# Patient Record
Sex: Female | Born: 1955
Health system: Southern US, Community
[De-identification: ages and names within clinical notes are randomized; demographics above are authoritative.]

## PROBLEM LIST (undated history)

## (undated) ENCOUNTER — Emergency Department (HOSPITAL_COMMUNITY): Admission: EM | Source: Home / Self Care

## (undated) DIAGNOSIS — E559 Vitamin D deficiency, unspecified: Secondary | ICD-10-CM

## (undated) DIAGNOSIS — Z9889 Other specified postprocedural states: Secondary | ICD-10-CM

## (undated) DIAGNOSIS — K8050 Calculus of bile duct without cholangitis or cholecystitis without obstruction: Secondary | ICD-10-CM

## (undated) DIAGNOSIS — K219 Gastro-esophageal reflux disease without esophagitis: Secondary | ICD-10-CM

## (undated) DIAGNOSIS — I10 Essential (primary) hypertension: Secondary | ICD-10-CM

## (undated) DIAGNOSIS — E669 Obesity, unspecified: Secondary | ICD-10-CM

## (undated) DIAGNOSIS — N8110 Cystocele, unspecified: Secondary | ICD-10-CM

## (undated) DIAGNOSIS — Z5189 Encounter for other specified aftercare: Secondary | ICD-10-CM

## (undated) DIAGNOSIS — M4120 Other idiopathic scoliosis, site unspecified: Secondary | ICD-10-CM

## (undated) DIAGNOSIS — E785 Hyperlipidemia, unspecified: Secondary | ICD-10-CM

## (undated) DIAGNOSIS — Z8679 Personal history of other diseases of the circulatory system: Secondary | ICD-10-CM

## (undated) DIAGNOSIS — Z973 Presence of spectacles and contact lenses: Secondary | ICD-10-CM

## (undated) DIAGNOSIS — Z780 Asymptomatic menopausal state: Secondary | ICD-10-CM

## (undated) HISTORY — DX: Other idiopathic scoliosis, site unspecified: M41.20

## (undated) HISTORY — DX: Gastro-esophageal reflux disease without esophagitis: K21.9

## (undated) HISTORY — DX: Obesity, unspecified: E66.9

## (undated) HISTORY — DX: Asymptomatic menopausal state: Z78.0

## (undated) HISTORY — DX: Essential (primary) hypertension: I10

## (undated) HISTORY — DX: Encounter for other specified aftercare: Z51.89

## (undated) HISTORY — DX: Other specified postprocedural states: Z98.89

## (undated) HISTORY — DX: Hyperlipidemia, unspecified: E78.5

## (undated) HISTORY — DX: Calculus of bile duct without cholangitis or cholecystitis without obstruction: K80.50

---
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
## 1981-06-28 HISTORY — PX: ECTOPIC PREGNANCY SURGERY: SHX613

## 1990-06-28 HISTORY — PX: VAGINAL HYSTERECTOMY: SUR661

## 2001-03-22 ENCOUNTER — Encounter: Payer: Self-pay | Admitting: Family Medicine

## 2001-03-22 ENCOUNTER — Ambulatory Visit (HOSPITAL_COMMUNITY): Admission: RE | Admit: 2001-03-22 | Discharge: 2001-03-22 | Payer: Self-pay | Admitting: Family Medicine

## 2001-09-26 ENCOUNTER — Other Ambulatory Visit: Admission: RE | Admit: 2001-09-26 | Discharge: 2001-09-26 | Payer: Self-pay | Admitting: Obstetrics and Gynecology

## 2002-04-17 ENCOUNTER — Encounter: Payer: Self-pay | Admitting: Obstetrics and Gynecology

## 2002-04-17 ENCOUNTER — Ambulatory Visit (HOSPITAL_COMMUNITY): Admission: RE | Admit: 2002-04-17 | Discharge: 2002-04-17 | Payer: Self-pay | Admitting: Obstetrics and Gynecology

## 2003-04-19 ENCOUNTER — Ambulatory Visit (HOSPITAL_COMMUNITY): Admission: RE | Admit: 2003-04-19 | Discharge: 2003-04-19 | Payer: Self-pay | Admitting: Obstetrics and Gynecology

## 2003-04-19 ENCOUNTER — Encounter: Payer: Self-pay | Admitting: Obstetrics and Gynecology

## 2003-04-23 ENCOUNTER — Encounter: Payer: Self-pay | Admitting: Internal Medicine

## 2005-02-02 ENCOUNTER — Other Ambulatory Visit: Admission: RE | Admit: 2005-02-02 | Discharge: 2005-02-02 | Payer: Self-pay | Admitting: Obstetrics and Gynecology

## 2006-06-28 LAB — CONVERTED CEMR LAB

## 2009-03-21 ENCOUNTER — Ambulatory Visit (HOSPITAL_COMMUNITY): Admission: RE | Admit: 2009-03-21 | Discharge: 2009-03-21 | Payer: Self-pay | Admitting: Orthopedic Surgery

## 2009-07-29 LAB — HM MAMMOGRAPHY

## 2010-05-18 ENCOUNTER — Encounter (INDEPENDENT_AMBULATORY_CARE_PROVIDER_SITE_OTHER): Payer: Self-pay | Admitting: *Deleted

## 2010-06-24 ENCOUNTER — Ambulatory Visit: Payer: Self-pay | Admitting: Internal Medicine

## 2010-06-24 ENCOUNTER — Ambulatory Visit
Admission: RE | Admit: 2010-06-24 | Discharge: 2010-06-24 | Payer: Self-pay | Source: Home / Self Care | Attending: Internal Medicine | Admitting: Internal Medicine

## 2010-06-24 DIAGNOSIS — M4120 Other idiopathic scoliosis, site unspecified: Secondary | ICD-10-CM | POA: Insufficient documentation

## 2010-06-24 DIAGNOSIS — I10 Essential (primary) hypertension: Secondary | ICD-10-CM

## 2010-06-24 DIAGNOSIS — K219 Gastro-esophageal reflux disease without esophagitis: Secondary | ICD-10-CM

## 2010-06-24 DIAGNOSIS — E669 Obesity, unspecified: Secondary | ICD-10-CM | POA: Insufficient documentation

## 2010-06-24 DIAGNOSIS — R519 Headache, unspecified: Secondary | ICD-10-CM | POA: Insufficient documentation

## 2010-06-24 DIAGNOSIS — R51 Headache: Secondary | ICD-10-CM | POA: Insufficient documentation

## 2010-06-24 DIAGNOSIS — Z780 Asymptomatic menopausal state: Secondary | ICD-10-CM | POA: Insufficient documentation

## 2010-06-28 HISTORY — PX: COLONOSCOPY: SHX174

## 2010-07-07 ENCOUNTER — Ambulatory Visit
Admission: RE | Admit: 2010-07-07 | Discharge: 2010-07-07 | Payer: Self-pay | Source: Home / Self Care | Attending: Internal Medicine | Admitting: Internal Medicine

## 2010-07-07 ENCOUNTER — Other Ambulatory Visit: Payer: Self-pay | Admitting: Internal Medicine

## 2010-07-07 ENCOUNTER — Encounter: Payer: Self-pay | Admitting: Internal Medicine

## 2010-07-07 LAB — CBC WITH DIFFERENTIAL/PLATELET
Basophils Absolute: 0 10*3/uL (ref 0.0–0.1)
Basophils Relative: 0.6 % (ref 0.0–3.0)
Eosinophils Absolute: 0.2 10*3/uL (ref 0.0–0.7)
Eosinophils Relative: 4.2 % (ref 0.0–5.0)
HCT: 36.2 % (ref 36.0–46.0)
Hemoglobin: 12 g/dL (ref 12.0–15.0)
Lymphocytes Relative: 35.7 % (ref 12.0–46.0)
Lymphs Abs: 2.1 10*3/uL (ref 0.7–4.0)
MCHC: 33.2 g/dL (ref 30.0–36.0)
MCV: 82 fl (ref 78.0–100.0)
Monocytes Absolute: 0.4 10*3/uL (ref 0.1–1.0)
Monocytes Relative: 7 % (ref 3.0–12.0)
Neutro Abs: 3.1 10*3/uL (ref 1.4–7.7)
Neutrophils Relative %: 52.5 % (ref 43.0–77.0)
Platelets: 257 10*3/uL (ref 150.0–400.0)
RBC: 4.42 Mil/uL (ref 3.87–5.11)
RDW: 16.4 % — ABNORMAL HIGH (ref 11.5–14.6)
WBC: 5.8 10*3/uL (ref 4.5–10.5)

## 2010-07-07 LAB — BASIC METABOLIC PANEL
BUN: 13 mg/dL (ref 6–23)
CO2: 29 mEq/L (ref 19–32)
Calcium: 9.2 mg/dL (ref 8.4–10.5)
Chloride: 104 mEq/L (ref 96–112)
Creatinine, Ser: 0.7 mg/dL (ref 0.4–1.2)
GFR: 107.37 mL/min (ref >60.00)
Glucose, Bld: 91 mg/dL (ref 70–99)
Potassium: 4.2 mEq/L (ref 3.5–5.1)
Sodium: 141 mEq/L (ref 135–145)

## 2010-07-07 LAB — LIPID PANEL
Cholesterol: 218 mg/dL — ABNORMAL HIGH (ref 0–200)
HDL: 39.4 mg/dL (ref >39.00)
Total CHOL/HDL Ratio: 6
Triglycerides: 117 mg/dL (ref 0.0–149.0)
VLDL: 23.4 mg/dL (ref 0.0–40.0)

## 2010-07-07 LAB — HEPATIC FUNCTION PANEL
ALT: 13 U/L (ref 0–35)
AST: 18 U/L (ref 0–37)
Albumin: 3.6 g/dL (ref 3.5–5.2)
Alkaline Phosphatase: 78 U/L (ref 39–117)
Bilirubin, Direct: 0.1 mg/dL (ref 0.0–0.3)
Total Bilirubin: 0.6 mg/dL (ref 0.3–1.2)
Total Protein: 7 g/dL (ref 6.0–8.3)

## 2010-07-07 LAB — URINALYSIS, ROUTINE W REFLEX MICROSCOPIC
Bilirubin Urine: NEGATIVE
Hemoglobin, Urine: NEGATIVE
Ketones, ur: NEGATIVE
Nitrite: NEGATIVE
Specific Gravity, Urine: 1.01 (ref 1.000–1.030)
Total Protein, Urine: NEGATIVE
Urine Glucose: NEGATIVE
Urobilinogen, UA: 0.2 (ref 0.0–1.0)
pH: 5.5 (ref 5.0–8.0)

## 2010-07-07 LAB — TSH: TSH: 2 u[IU]/mL (ref 0.35–5.50)

## 2010-07-07 LAB — LDL CHOLESTEROL, DIRECT: Direct LDL: 162.8 mg/dL

## 2010-07-08 DIAGNOSIS — E668 Other obesity: Secondary | ICD-10-CM | POA: Insufficient documentation

## 2010-07-28 NOTE — Letter (Signed)
Summary: New Patient letter  [facility]  [address] [place], [place] [address]   Phone: [phone]  Fax: [phone]       05/18/2010 MRN: [id number]    [facility] MATHEWS-BETHEA [address] [place], [place]  [address]  Dear Ms. MATHEWS-BETHEA,  Welcome to the Gastroenterology Division at [facility].    You are scheduled to see Dr. Marina Goodell on July 07, 2010 at 9:30 A.M. on the 3rd floor at [facility], [address]  We ask that you try to arrive at our office 15 minutes prior to your appointment time to allow for check-in.  We would like you to complete the enclosed self-administered evaluation form prior to your visit and bring it with you on the day of your appointment.  We will review it with you.  Also, please bring a complete list of all your medications or, if you prefer, bring the medication bottles and we will list them.  Please bring your insurance card so that we may make a copy of it.  If your insurance requires a referral to see a specialist, please bring your referral form from your primary care physician.  Co-payments are due at the time of your visit and may be paid by cash, check or credit card.     Your office visit will consist of a consult with your physician (includes a physical exam), any laboratory testing he/she may order, scheduling of any necessary diagnostic testing (e.g. x-ray, ultrasound, CT-scan), and scheduling of a procedure (e.g. Endoscopy, Colonoscopy) if required.  Please allow enough time on your schedule to allow for any/all of these possibilities.    If you cannot keep your appointment, please call [phone] to cancel or reschedule prior to your appointment date.  This allows [place] the opportunity to schedule an appointment for another patient in need of care.  If you do not cancel or reschedule by 5 p.m. the business day prior to your appointment date, you will be charged a $50.00 late cancellation/no-show fee.     Thank you for choosing [facility] for your medical needs.  We appreciate the opportunity to care for you.  Please visit [place] at our website  to learn more about our practice.                     Sincerely,                                                             The Gastroenterology Division

## 2010-07-30 NOTE — Assessment & Plan Note (Addendum)
Summary: Consult GERD, family history of colon cancer   History of Present Illness Visit Type: consult Primary GI MD: Yancey Flemings MD Primary Provider: Newt Lukes MD Chief Complaint: Ongoing acid reflux and blotaing. Consult Colonscopy and Upper Endoscopy. Pt was told in 2004 to have screening ECL. Wants to discuss scheduling now.  History of Present Illness:   55 year old female with a history of hypertension, and GERD. She presents today regarding chronic GERD and colon cancer screening. She was seen on one occasion in 2004 regarding GERD, dyspeptic symptoms, and colon cancer screening. She was to have undergone screening colonoscopy but decided not to follow through. She has not been seen since. Since that time, her father was diagnosed with colon cancer in his late 29s or early 17s. She denies any lower GI complaints such as abdominal pain, change in bowel habits, unexplained weight loss, or bleeding. She does have classic reflux symptoms that are apparent when she is not taking PPI therapy. Prilosec and Nexium have controlled classic symptoms. More recently she has developed problems with nocturnal cough, which has been attributed to GERD. She denies dysphagia, regurgitation, or abdominal pain. Currently, she is taking Nexium 40 mg daily for reflux. She also uses hydramine for cough.   GI Review of Systems    Reports acid reflux, belching, and  bloating.      Denies abdominal pain, chest pain, dysphagia with liquids, dysphagia with solids, heartburn, loss of appetite, nausea, vomiting, vomiting blood, weight loss, and  weight gain.        Denies anal fissure, black tarry stools, change in bowel habit, constipation, diarrhea, diverticulosis, fecal incontinence, heme positive stool, hemorrhoids, irritable bowel syndrome, jaundice, light color stool, liver problems, rectal bleeding, and  rectal pain.    Current Medications (verified): 1)  Metoprolol Succinate 50 Mg Xr24h-Tab (Metoprolol  Succinate) .... One Tablet By Mouth Once Daily 2)  Hydrochlorothiazide 12.5 Mg Tabs (Hydrochlorothiazide) .... One Tablet By Mouth Once Daily 3)  Klor-Con M20 20 Meq Cr-Tabs (Potassium Chloride Crys Cr) .... Take 1 By Mouth Once Daily 4)  Atacand 16 Mg Tabs (Candesartan Cilexetil) .... Take 1 By Mouth Once Daily 5)  Nexium 40 Mg Cpdr (Esomeprazole Magnesium) .Marland Kitchen.. 1 By Mouth Once Daily 6)  Hydromet 5-1.5 Mg/22ml Syrp (Hydrocodone-Homatropine) .... 5 Cc By Mouth At Bedtime X 7-10days, Then As Needed For Cough Symptoms  Allergies (verified): No Known Drug Allergies  Past History:  Past Medical History: Reviewed history from 06/24/2010 and no changes required. GERD Hypertension scoliosis  MD roster: card- Erskine Emery [facility] - [facility] - Hatsuko Bizzarro  Past Surgical History: Reviewed history from 07/02/2010 and no changes required. Hysterectomy (19900 Double Hernia Repair  Family History: Reviewed history from 06/24/2010 and no changes required. Family History Breast cancer 1st degree relative <50 (parent) Family History of Colon CA 1st degree relative <60 (dad) Family History Diabetes 1st degree relative (parent) Family History High cholesterol (parent) Family History Hypertension (parent) Stroke (other relative) dad - CABG age 52y - colon ca -  bro expired age 24 - MI, CVA, DM, sarcoid mom expired age 36y - DM, breast ca  Social History: Reviewed history from 06/24/2010 and no changes required. 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 passive tobacco exposure but never smoked rare social alcohol use  Review of Systems  The patient denies allergy/sinus, anemia, anxiety-new, arthritis/joint pain, back pain, blood in urine,  breast changes/lumps, change in vision, confusion, cough, coughing up blood, depression-new, fainting, fatigue, fever, headaches-new, hearing problems, heart murmur, heart  rhythm changes, itching, menstrual pain, muscle pains/cramps, night sweats, nosebleeds, pregnancy symptoms, shortness of breath, skin rash, sleeping problems, sore throat, swelling of feet/legs, swollen lymph glands, thirst - excessive , urination - excessive , urination changes/pain, urine leakage, vision changes, and voice change.    Vital Signs:  Patient profile:   55 year old female Height:      68.5 inches Weight:      219 pounds BMI:     32.93 Pulse rate:   70 / minute Pulse rhythm:   regular BP sitting:   144 / 84  (left arm) Cuff size:   large  Vitals Entered By: Christie Nottingham CMA Duncan Dull) (July 07, 2010 9:31 AM)  Physical Exam  General:  Well developed, well nourished, no acute distress. Head:  Normocephalic and atraumatic. Eyes:  PERRLA, no icterus. Nose:  No deformity, discharge,  or lesions. Mouth:  No deformity or lesions, dentition normal. Neck:  Supple; no masses or thyromegaly. Lungs:  Clear throughout to auscultation. Heart:  Regular rate and rhythm; no murmurs, rubs,  or bruits. Abdomen:  Soft, nontender and nondistended. No masses, hepatosplenomegaly or hernias noted. Normal bowel sounds. Rectal:  deferred until colonoscopy Msk:  Symmetrical with no gross deformities. Normal posture. Pulses:  Normal pulses noted. Extremities:  no edema Neurologic:  alert and oriented Skin:  Intact without significant lesions or rashes. Psych:  Alert and cooperative. Normal mood and affect.   Impression & Recommendations:  Problem # 1:  GERD (ICD-530.81) GERD a history. Classic symptoms controlled with PPI. Not clear whether cough has any relationship to her problems with GERD.  Plan: #1. Reflux precautions with attention to weight loss #2. Continue Nexium 40 mg daily. #3. Schedule diagnostic upper endoscopy  Problem # 2:  SPECIAL SCREENING FOR MALIGNANT NEOPLASMS COLON (ICD-V76.51) the patient is at higher than baseline risk due to her family history. We discussed  the nature of colonoscopy as well as the risks, benefits, and alternatives. She understood and agreed to proceed. Movi prep prescribed. The patient instructed on its use  Problem # 3:  COUGH (ICD-786.2) not clear that cough is related to GERD. Workup as described above. If no response to higher dose PPI, then explore other causes for chronic cough per her PCP.  Problem # 4:  FAMILY HISTORY OF COLON CA 1ST DEGREE RELATIVE (V16.0) father late 37s, early 29s.  Other Orders: Colon/Endo (Colon/Endo)  Patient Instructions: 1)  Colon/Endo [facility] 08/18/10 9:30 am arrive at 8:30 am 2)  Movi prep instructions given to patient and Rx. sent to pharmacy. 3)  Colonoscopy and Flexible Sigmoidoscopy brochure given.  4)  Upper Endoscopy brochure given.  5)  Copy sent to : Newt Lukes MD 6)  The medication list was reviewed and reconciled.  All changed / newly prescribed medications were explained.  A complete medication list was provided to the patient / caregiver. Prescriptions: MOVIPREP 100 GM  SOLR (PEG-KCL-NACL-NASULF-NA ASC-C) As per prep instructions.  #1 x 0   Entered by:   Milford Cage NCMA   Authorized by:   Hilarie Fredrickson MD   Signed by:   Milford Cage NCMA on 07/07/2010   Method used:   Electronically to        [facility]* (retail)       [address]       [address] Shipping/mailing       [place], [place]  [address]       Ph: [phone]       Fax: [phone]   RxID:   [id number]

## 2010-07-30 NOTE — Letter (Signed)
Summary: [facility] Instructions  [facility]  [address] [place], [place] [address]   Phone: [phone]  Fax: [phone]       Janice Rodriguez    December 17, 1955    MRN: [id number]        Procedure Day /Date:TUESDAY, 08/18/10     Arrival Time:8:30 AM     Procedure Time:9:30 AM     Location of Procedure:                    X  [facility] (4th Floor)   PREPARATION FOR COLONOSCOPY WITH MOVIPREP/ENDO   Starting 5 days prior to your procedure 08/13/10 do not eat nuts, seeds, popcorn, corn, beans, peas,  salads, or any raw vegetables.  Do not take any fiber supplements (e.g. Metamucil, Citrucel, and Benefiber).  THE DAY BEFORE YOUR PROCEDURE         DATE:08/17/10  DAY: MONDAY  1.  Drink clear liquids the entire day-NO SOLID FOOD  2.  Do not drink anything colored red or purple.  Avoid juices with pulp.  No orange juice.  3.  Drink at least 64 oz. (8 glasses) of fluid/clear liquids during the day to prevent dehydration and help the prep work efficiently.  CLEAR LIQUIDS INCLUDE: Water Jello Ice Popsicles Tea (sugar ok, no milk/cream) Powdered fruit flavored drinks Coffee (sugar ok, no milk/cream) Gatorade Juice: apple, white grape, white cranberry  Lemonade Clear bullion, consomm, broth Carbonated beverages (any kind) Strained chicken noodle soup Hard Candy                             4.  In the morning, mix first dose of MoviPrep solution:    Empty 1 Pouch A and 1 Pouch B into the disposable container    Add lukewarm drinking water to the top line of the container. Mix to dissolve    Refrigerate (mixed solution should be used within 24 hrs)  5.  Begin drinking the prep at 5:00 p.m. The MoviPrep container is divided by 4 marks.   Every 15 minutes drink the solution down to the next mark (approximately 8 oz) until the full liter is complete.   6.  Follow completed prep with 16 oz of clear liquid of your choice (Nothing red or purple).   Continue to drink clear liquids until bedtime.  7.  Before going to bed, mix second dose of MoviPrep solution:    Empty 1 Pouch A and 1 Pouch B into the disposable container    Add lukewarm drinking water to the top line of the container. Mix to dissolve    Refrigerate  THE DAY OF YOUR PROCEDURE      DATE: 08/18/10 DAY: TUESDAY  Beginning at 4:30 a.m. (5 hours before procedure):         1. Every 15 minutes, drink the solution down to the next mark (approx 8 oz) until the full liter is complete.  2. Follow completed prep with 16 oz. of clear liquid of your choice.    3. You may drink clear liquids until 7:30 AM (2 HOURS BEFORE PROCEDURE).   MEDICATION INSTRUCTIONS  Unless otherwise instructed, you should take regular prescription medications with a small sip of water   as early as possible the morning of your procedure.         OTHER INSTRUCTIONS  You will need a responsible adult at least 57 years of age to  accompany you and drive you home.   This person must remain in the waiting room during your procedure.  Wear loose fitting clothing that is easily removed.  Leave jewelry and other valuables at home.  However, you may wish to bring a book to read or  an iPod/MP3 player to listen to music as you wait for your procedure to start.  Remove all body piercing jewelry and leave at home.  Total time from sign-in until discharge is approximately 2-3 hours.  You should go home directly after your procedure and rest.  You can resume normal activities the  day after your procedure.  The day of your procedure you should not:   Drive   Make legal decisions   Operate machinery   Drink alcohol   Return to work  You will receive specific instructions about eating, activities and medications before you leave.    The above instructions have been reviewed and explained to me by   _______________________    I fully understand and can verbalize these instructions  _____________________________ Date _________

## 2010-07-30 NOTE — Assessment & Plan Note (Signed)
Summary: New / UMR / # / cd   Vital Signs:  Patient profile:   55 year old female Height:      68.5 inches (173.99 cm) Weight:      220.4 pounds (100.18 kg) BMI:     33.14 O2 Sat:      98 % on Room air Temp:     98.4 degrees F (36.89 degrees C) oral Pulse rate:   74 / minute BP sitting:   120 / 82  (left arm) Cuff size:   regular  Vitals Entered By: Orlan Leavens RMA (June 24, 2010 2:51 PM)  O2 Flow:  Room air CC: New patient Is Patient Diabetic? No Pain Assessment Patient in pain? no        Primary Care Shadonna Benedick:  Newt Lukes MD  CC:  New patient.  History of Present Illness: new pt to me and our practice, here to est care also patient is here today for annual physical. Patient feels well overall - will return for labs when fasting  reviewed chronic med issues: HTN - follows with cards annually for same due to strong FH CAD - reports compliance with ongoing medical treatment and no changes in medication dose or frequency. denies adverse side effects related to current therapy. no CP, Ha or edema  GERD - take otc PPI - recurrent burning symptoms if missed dose - symptoms worse at night and exac by particular food/meds - no hx ulcers - no abd pain - has upcoming GI apt sched to discuss same as well as colo (never done age >48 and FH colo ca)  scoliosis - denies back pain but +leg and hip fatigue on left side felt related to saem - has never had back eval but would be interested in same now  c/o cough - ongoing >89mo - dry cough, no sputum - occurs only at bedtime (noted by spouse, pt unaware) -no CP, ST, SOB, no DOE, no edema, no fever, NS or LAD - no hemoytsis; nonsmoker but heavy life long exposure to second hand smke - parents and spouse  Preventive Screening-Counseling & Management  Alcohol-Tobacco     Alcohol drinks/day: <1     Alcohol Counseling: not indicated; use of alcohol is not excessive or problematic     Smoking Status: never     Passive Smoke  Exposure: yes     Tobacco Counseling: not indicated; no tobacco use     Passive Smoke Counseling: to avoid passive smoke exposure  Caffeine-Diet-Exercise     Diet Counseling: to improve diet; diet is suboptimal     Does Patient Exercise: no     Exercise Counseling: to improve exercise regimen     Depression Counseling: not indicated; screening negative for depression  Safety-Violence-Falls     Seat Belt Counseling: not indicated; patient wears seat belts     Helmet Counseling: not applicable     Violence Counseling: not indicated; no violence risk noted     Sexual Abuse: no     Sexual Abuse Counseling: no     Fall Risk Counseling: not indicated; no significant falls noted  Clinical Review Panels:  Prevention   Last Mammogram:  No specific mammographic evidence of malignancy.   (07/29/2009)   Last Pap Smear:  Interpretation Result:Negative for intraepithelial Lesion or Malignancy.    (06/28/2006)  Immunizations   Last Tetanus Booster:  Historical (06/28/2006)   Last Flu Vaccine:  Historical (04/28/2010)   Last Pneumovax:  Historical (04/28/2010)   Current  Medications (verified): 1)  Metoprolol Succinate 25 Mg Xr24h-Tab (Metoprolol Succinate) .... Take 1 By Mouth Once Daily 2)  Hydrochlorothiazide 25 Mg Tabs (Hydrochlorothiazide) .... Take 1 By Mouth Once Daily 3)  Klor-Con M20 20 Meq Cr-Tabs (Potassium Chloride Crys Cr) .... Take 1 By Mouth Once Daily 4)  Atacand 16 Mg Tabs (Candesartan Cilexetil) .... Take 1 By Mouth Once Daily 5)  Prilosec Otc 20 Mg Tbec (Omeprazole Magnesium) .... Take 1 By Mouth Once Daily  Allergies (verified): No Known Drug Allergies  Past History:  Past Medical History: GERD Hypertension scoliosis  MD roster: card- Ersk[facility] - [facility] - [facility]  Past Surgical History: Hysterectomy (38  Family History: Family History Breast cancer 1st degree relative <50 (parent) Family History of Colon CA 1st degree relative <60 (dad) Family  History Diabetes 1st degree relative (parent) Family History High cholesterol (parent) Family History Hypertension (parent) Stroke (other relative) dad - CABG age 9y - colon ca -  bro expired age 52 - MI, CVA, DM, sarcoid mom expired age 86y - DM, breast ca  Social History: married, lives with spouse and occ kid when back at home  3 grown children 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 I have reviewed all other systems and they were negative.   Physical Exam  General:  overweight-appearing.  soft spoken, alert, well-developed, well-nourished, and cooperative to examination.    Head:  Normocephalic and atraumatic without obvious abnormalities. No apparent alopecia or balding. Eyes:  vision grossly intact; pupils equal, round and reactive to light.  conjunctiva and lids normal.    Ears:  normal pinnae bilaterally, without erythema, swelling, or tenderness to palpation. TMs clear, without effusion, or cerumen impaction. Hearing grossly normal bilaterally  Mouth:  teeth and gums in good repair; mucous membranes moist, without lesions or ulcers. oropharynx clear without exudate, no erythema.  Neck:  thick, supple, full ROM, no masses, no thyromegaly; no thyroid nodules or tenderness. no JVD or carotid bruits.   Lungs:  normal respiratory effort, no intercostal retractions or use of accessory muscles; normal breath sounds bilaterally - no crackles and no wheezes.    Heart:  normal rate, regular rhythm, no murmur, and no rub. BLE without edema. normal DP pulses and normal cap refill in all 4 extremities    Abdomen:  obese, soft, non-tender, normal bowel sounds, no distention; no masses and no appreciable hepatomegaly or splenomegaly.   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  strength normal in all extremities, sensation intact to light touch, and gait normal. speech fluent without dysarthria or aphasia; follows commands with good comprehension.  Skin:  no rashes, vesicles, ulcers, or erythema. No nodules or irregularity to palpation.  Psych:  Oriented X3, memory intact for recent and remote, normally interactive, good eye contact, not anxious appearing, not depressed appearing, and not agitated.      Impression & Recommendations:  Problem # 1:  PREVENTIVE HEALTH CARE (ICD-V70.0) Patient has been counseled on age-appropriate routine health concerns for screening and prevention. These are reviewed and up-to-date. Immunizations are up-to-date or declined. Labs ordered and ECG declined - upcoming GI eval for colo and  refer to gyn - also dexa Orders: TLB-BMP (Basic Metabolic Panel-BMET) (80048-METABOL) TLB-CBC Platelet - w/Differential (85025-CBCD) TLB-Hepatic/Liver Function Pnl (80076-HEPATIC) TLB-Lipid Panel (80061-LIPID) TLB-TSH (Thyroid Stimulating Hormone) (84443-TSH) TLB-Udip w/ Micro (81001-URINE) Misc. Referral (Misc. Ref) Gynecologic Referral (Gyn)  Problem # 2:  COUGH (ICD-786.2) nighttime symptoms only >6 mo - suspect related to uncontrolled GERD - no red flags on hx and exam benign check cxr now given hx 2nd hand tobacco exposure and chroicity of symptoms - change PPI (see next) and add at bedtime antitussive for suppression tc x 7-10d, then as needed  Orders: T-2 View CXR (71020TC)  Problem # 3:  SCOLIOSIS (ICD-737.30) suspect idiopathic - ?causing left hip and knee pain symptoms from pelvic tile - refer to ortho spine for formal eval and opinion on same Orders: Orthopedic Surgeon Referral (Ortho Surgeon)  Problem # 4:  GERD (ICD-530.81)  change prilosec otc to nexium - erx done GI appt upcoming 06/2010 for same  + colo screen Her updated medication list for this problem includes:    Nexium 40 Mg Cpdr (Esomeprazole magnesium) .Marland Kitchen... 1 by mouth once daily  Orders: Prescription Created Electronically [id number])  Problem # 5:  HYPERTENSION (ICD-401.9)  Her updated medication list for this problem includes:    Metoprolol Succinate 25 Mg Xr24h-tab (Metoprolol succinate) .Marland Kitchen... Take 1 by mouth once daily    Hydrochlorothiazide 25 Mg Tabs (Hydrochlorothiazide) .Marland Kitchen... Take 1 by mouth once daily    Atacand 16 Mg Tabs (Candesartan cilexetil) .Marland Kitchen... Take 1 by mouth once daily  BP today: 120/82  Problem # 6:  OBESITY (ICD-278.00) advised on need for exercise and weight loss, esp with back/hip prob and HTN and FH CAD Ht: 68.5 (06/24/2010)   Wt: 220.4 (06/24/2010)   BMI: 33.14 (06/24/2010)  Complete Medication List: 1)  Metoprolol Succinate 25 Mg Xr24h-tab (Metoprolol succinate) .... Take 1 by mouth once daily 2)  Hydrochlorothiazide 25 Mg Tabs (Hydrochlorothiazide) .... Take 1 by mouth once daily 3)  Klor-con M20 20 Meq Cr-tabs (Potassium chloride crys cr) .... Take 1 by mouth once daily 4)  Atacand 16 Mg Tabs (Candesartan cilexetil) .... Take 1 by mouth once daily 5)  Nexium 40 Mg Cpdr (Esomeprazole magnesium) .Marland Kitchen.. 1 by mouth once daily 6)  Hydromet 5-1.5 Mg/66ml Syrp (Hydrocodone-homatropine) .... 5 cc by mouth at bedtime x 7-10days, then as needed for cough symptoms  Patient Instructions: 1)  it was good to see you today. 2)  we'll make referral to Dr. Noel Gerold or his PAs for your back and to [facility] for DEXA bone scan and dr. Nestor Ramp for PAP/pelvic. Our office will contact you regarding these appointments once made.  3)  chest xray today for cough - your results will be called to you after review 4)  change prilosec otc to nexium once daily to help with reflux symptoms which could be aggrevating nocturnal cough - also use cough syrup at bedtime as directed - your prescriptions have been electronically submitted  or faxed to your pharmacy. Please take as directed. Contact our office if you believe you're having problems with the medication(s).  5)  return for CPX labs one AM when fasting - copy of these labs will be mailed to you - if any changes need to be made or there are abnormal results, you will be contacted directly.  6)  Please schedule a follow-up appointment in 6-12 months, call sooner if problems.  Prescriptions: HYDROMET 5-1.5 MG/5ML SYRP (HYDROCODONE-HOMATROPINE) 5 cc by mouth at bedtime x 7-10days, then as  needed for cough symptoms  #100cc x 0   Entered and Authorized by:   Newt Lukes MD   Signed by:   Newt Lukes MD on 06/24/2010   Method used:   Printed then faxed to ...       [facility]* (retail)       [address]       [address] Shipping/mailing       [place], [place]  [address]       Ph: [phone]       Fax: [phone]   RxID:   [id number] NEXIUM 40 MG CPDR (ESOMEPRAZOLE MAGNESIUM) 1 by mouth once daily  #30 x 3   Entered and Authorized by:   Newt Lukes MD   Signed by:   Newt Lukes MD on 06/24/2010   Method used:   Electronically to        [facility]* (retail)       [address]       [address] Shipping/mailing       [place], [place]  [address]       Ph: [phone]       Fax: [phone]   RxID:   [id number]    Orders Added: 1)  TLB-BMP (Basic Metabolic Panel-BMET) [80048-METABOL] 2)  TLB-CBC Platelet - w/Differential [85025-CBCD] 3)  TLB-Hepatic/Liver Function Pnl [80076-HEPATIC] 4)  TLB-Lipid Panel [80061-LIPID] 5)  TLB-TSH (Thyroid Stimulating Hormone) [84443-TSH] 6)  TLB-Udip w/ Micro [81001-URINE] 7)  Orthopedic Surgeon Referral [Ortho Surgeon] 8)  Misc. Referral [Misc. Ref] 9)  T-2 View CXR [71020TC] 10)  New Patient 40-64 years [phone] 2)  Gynecologic Referral [Gyn] 76)  New Patient Level II [99202] 13)  Prescription Created Electronically  [phone]   Immunization History:  Tetanus/Td Immunization History:    Tetanus/Td:  historical (06/28/2006)  Influenza Immunization History:    Influenza:  historical (04/28/2010)  Pneumovax Immunization History:    Pneumovax:  historical (04/28/2010)   Immunization History:  Tetanus/Td Immunization History:    Tetanus/Td:  Historical (06/28/2006)  Influenza Immunization History:    Influenza:  Historical (04/28/2010)  Pneumovax Immunization History:    Pneumovax:  Historical (04/28/2010)    Pap Smear  Procedure date:  06/28/2006  Findings:      Interpretation Result:Negative for intraepithelial Lesion or Malignancy.     Mammogram  Procedure date:  07/29/2009  Findings:      No specific mammographic evidence of malignancy.

## 2010-08-18 ENCOUNTER — Other Ambulatory Visit: Payer: Self-pay | Admitting: Internal Medicine

## 2010-08-18 ENCOUNTER — Encounter (AMBULATORY_SURGERY_CENTER): Payer: 59 | Admitting: Internal Medicine

## 2010-08-18 DIAGNOSIS — K219 Gastro-esophageal reflux disease without esophagitis: Secondary | ICD-10-CM

## 2010-08-18 DIAGNOSIS — D131 Benign neoplasm of stomach: Secondary | ICD-10-CM

## 2010-08-18 DIAGNOSIS — K5730 Diverticulosis of large intestine without perforation or abscess without bleeding: Secondary | ICD-10-CM

## 2010-08-18 DIAGNOSIS — D126 Benign neoplasm of colon, unspecified: Secondary | ICD-10-CM

## 2010-08-18 DIAGNOSIS — K222 Esophageal obstruction: Secondary | ICD-10-CM

## 2010-08-18 DIAGNOSIS — Z800 Family history of malignant neoplasm of digestive organs: Secondary | ICD-10-CM

## 2010-08-18 DIAGNOSIS — Z1211 Encounter for screening for malignant neoplasm of colon: Secondary | ICD-10-CM

## 2010-08-19 NOTE — Consult Note (Signed)
Summary: [facility]   Imported By: Sherian Rein 08/12/2010 06:53:53  _____________________________________________________________________  External Attachment:    Type:   Image     Comment:   External Document

## 2010-08-25 ENCOUNTER — Encounter: Payer: Self-pay | Admitting: Internal Medicine

## 2010-08-25 NOTE — Procedures (Addendum)
Summary: Colonoscopy  Patient: Jailin Manocchio Note: All result statuses are Final unless otherwise noted.  Tests: (1) Colonoscopy (COL)   COL Colonoscopy           DONE     [facility]     [address]     [place], [place]  [address]           COLONOSCOPY PROCEDURE REPORT           PATIENT:  Breeley, Bischof  MR#:  [id number]     BIRTHDATE:  27-Apr-1956, 55 yrs. old  GENDER:  female     ENDOSCOPIST:  Wilhemina Bonito. Eda Keys, MD     REF. BY:  Office     PROCEDURE DATE:  08/18/2010     PROCEDURE:  Colonoscopy with snare polypectomy x 1     ASA CLASS:  Class II     INDICATIONS:  Elevated Risk Screening, family history of colon     cancer ; parent 60-70s     MEDICATIONS:   Fentanyl 100 mcg IV, Versed 10 mg IV           DESCRIPTION OF PROCEDURE:   After the risks benefits and     alternatives of the procedure were thoroughly explained, informed     consent was obtained.  Digital rectal exam was performed and     revealed no abnormalities.   The LB [id number]-[id number] endoscope     was introduced through the anus and advanced to the cecum, which     was identified by both the appendix and ileocecal valve, without     limitations.Time to cecum = 3:52 min.  The quality of the prep was     excellent, using MoviPrep.  The instrument was then slowly     withdrawn (time = 8:55 min) as the colon was fully examined.     <<PROCEDUREIMAGES>>           FINDINGS:  A diminutive polyp was found in the sigmoid colon.     Polyp was snared without cautery. Retrieval was successful.     Moderate diverticulosis was found ascending colon to sigmoid colon.     Otherwise normal colonoscopy without other polyps, masses,     vascular ectasias, or inflammatory changes.   Retroflexed views in     the rectum revealed small internal hemorrhoids.    The scope was     then withdrawn from the patient and the procedure completed.           COMPLICATIONS:  None     ENDOSCOPIC IMPRESSION:     1)  Diminutive polyp in the sigmoid colon - removed     2) Moderate diverticulosis ascending colon to sigmoid colon     3) Otherwise normal colonoscopy     4) Internal hemorrhoids     5) Family hx CRC           RECOMMENDATIONS:     1) Follow up colonoscopy in 5 years           ______________________________     Wilhemina Bonito. Eda Keys, MD           CC:  Newt Lukes, MD;  The Patient           n.     eSIGNED:   Wilhemina Bonito. Eda Keys at 08/18/2010 10:23 AM           Rodman Pickle, [id number]  Note: An exclamation mark Marland Kitchen)  indicates a result that was not dispersed into the flowsheet. Document Creation Date: 08/18/2010 10:23 AM _______________________________________________________________________  (1) Order result status: Final Collection or observation date-time: 08/18/2010 10:16 Requested date-time:  Receipt date-time:  Reported date-time:  Referring Physician:   Ordering Physician: Fransico Setters [phone]) Specimen Source:  Source: Launa Grill Order Number: [id number] Lab site:   Appended Document: Colonoscopy recall     Procedures Next Due Date:    Colonoscopy: 07/2015

## 2010-08-25 NOTE — Procedures (Addendum)
Summary: Upper Endoscopy  Patient: Janice Rodriguez Note: All result statuses are Final unless otherwise noted.  Tests: (1) Upper Endoscopy (EGD)   EGD Upper Endoscopy       DONE     [facility]     [address]     [place], [place]  [address]           ENDOSCOPY PROCEDURE REPORT           PATIENT:  Janice Rodriguez, Janice Rodriguez  MR#:  [id number]     BIRTHDATE:  [birth year], 55 yrs. old  GENDER:  female           ENDOSCOPIST:  Wilhemina Bonito. Eda Keys, MD     Referred by:  Office           PROCEDURE DATE:  08/18/2010     PROCEDURE:  EGD, diagnostic 29562     ASA CLASS:  Class II     INDICATIONS:  GERD           MEDICATIONS:   There was residual sedation effect present from     prior procedure., Benadryl 25 mg IV, Versed 2 mg IV     TOPICAL ANESTHETIC:  Exactacain Spray           DESCRIPTION OF PROCEDURE:   After the risks benefits and     alternatives of the procedure were thoroughly explained, informed     consent was obtained.  The LB GIF-H180 [id number] endoscope was     introduced through the mouth and advanced to the second portion of     the duodenum, without limitations.  The instrument was slowly     withdrawn as the mucosa was fully examined.     <<PROCEDUREIMAGES>>           A large caliber esophageal ring was found in the distal esophagus.     Otherwise normal esophagus.  There were multiple benign fundic     gland type polyps identified. in the body and fundus of the     stomach.  Otherwise normal stomach.  The duodenal bulb was normal     in appearance, as was the postbulbar duodenum.    Retroflexed     views revealed a small hiatal hernia.    The scope was then     withdrawn from the patient and the procedure completed.           COMPLICATIONS:  None           ENDOSCOPIC IMPRESSION:     1) Ring, esophageal in the distal esophagus     2) Otherwise normal esophagus     3) Polyps, multiple and benign in the body / fundus of the     stomach     4) Otherwise  normal stomach     5) Normal duodenum     6) GERD           RECOMMENDATIONS:     1) Anti-reflux regimen to be followed     2) Continue PPI (reflux medication) to control symptoms           _____________________________     Wilhemina Bonito. Eda Keys, MD           CC:  Newt Lukes, MD;  The Patient           n.     eSIGNED:   Wilhemina Bonito. Eda Keys at 08/18/2010 10:37 AM  Janice Rodriguez, Janice Rodriguez, [id number]  Note: An exclamation mark (!) indicates a result that was not dispersed into the flowsheet. Document Creation Date: 08/18/2010 10:37 AM _______________________________________________________________________  (1) Order result status: Final Collection or observation date-time: 08/18/2010 10:29 Requested date-time:  Receipt date-time:  Reported date-time:  Referring Physician:   Ordering Physician: Fransico Setters [phone]) Specimen Source:  Source: Launa Grill Order Number: [id number] Lab site:

## 2010-09-02 ENCOUNTER — Encounter: Payer: Self-pay | Admitting: Internal Medicine

## 2010-09-03 NOTE — Letter (Signed)
Summary: Patient Notice- Polyp Results  [facility]  [address] [place], [place] [address]   Phone: [phone]  Fax: [phone]        August 25, 2010 MRN: [id number]    [facility] MATHEWS-BETHEA [address] [place], [place]  [address]    Dear Ms. MATHEWS-BETHEA,  I am pleased to inform you that the colon polyp(s) removed during your recent colonoscopy was (were) found to be benign (no cancer detected) upon pathologic examination.  I recommend you have a repeat colonoscopy examination in 5 years to look for recurrent polyps, as having colon polyps increases your risk for having recurrent polyps or even colon cancer in the future.  Should you develop new or worsening symptoms of abdominal pain, bowel habit changes or bleeding from the rectum or bowels, please schedule an evaluation with either your primary care physician or with me.  Additional information/recommendations:  __ No further action with gastroenterology is needed at this time. Please      follow-up with your primary care physician for your other healthcare      needs.   Please call us if you are having persistent problems or have questions about your condition that have not been fully answered at this time.  Sincerely,  Hilarie Fredrickson MD  This letter has been electronically signed by your physician.  Appended Document: Patient Notice- Polyp Results letter mailed

## 2010-10-28 ENCOUNTER — Other Ambulatory Visit: Payer: Self-pay | Admitting: *Deleted

## 2010-10-28 MED ORDER — ESOMEPRAZOLE MAGNESIUM 40 MG PO CPDR: 40.0000 mg | DELAYED_RELEASE_CAPSULE | Freq: Every day | ORAL | Status: DC

## 2010-11-11 ENCOUNTER — Encounter: Payer: Self-pay | Admitting: Internal Medicine

## 2011-05-10 ENCOUNTER — Telehealth: Payer: Self-pay | Admitting: *Deleted

## 2011-05-10 MED ORDER — ESOMEPRAZOLE MAGNESIUM 40 MG PO CPDR: 40.0000 mg | DELAYED_RELEASE_CAPSULE | Freq: Every day | ORAL | Status: DC

## 2011-05-10 NOTE — Telephone Encounter (Signed)
Done

## 2011-05-11 ENCOUNTER — Telehealth: Payer: Self-pay | Admitting: *Deleted

## 2011-05-11 NOTE — Telephone Encounter (Signed)
Faxing nexium script back to [facility]...05/11/11@9 ;04am/LMB

## 2011-07-14 ENCOUNTER — Encounter (HOSPITAL_COMMUNITY): Payer: Self-pay | Admitting: Cardiology

## 2011-07-14 ENCOUNTER — Emergency Department (HOSPITAL_COMMUNITY)
Admission: EM | Admit: 2011-07-14 | Discharge: 2011-07-14 | Disposition: A | Discharge status: Home / Self Care | Payer: 59 | Source: Home / Self Care

## 2011-07-14 DIAGNOSIS — M549 Dorsalgia, unspecified: Secondary | ICD-10-CM
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
## 2011-07-16 NOTE — ED Provider Notes (Signed)
Dx: back pain  Medical screening examination/treatment/procedure(s) were performed by non-physician practitioner and as supervising physician I was immediately available for consultation/collaboration.  Luiz Blare MD   Luiz Blare, MD 07/16/11 [phone]

## 2011-08-09 ENCOUNTER — Ambulatory Visit: Payer: 59 | Attending: Rehabilitation | Admitting: Physical Therapy

## 2011-08-09 DIAGNOSIS — M2569 Stiffness of other specified joint, not elsewhere classified: Secondary | ICD-10-CM | POA: Insufficient documentation

## 2011-08-09 DIAGNOSIS — M545 Low back pain, unspecified: Secondary | ICD-10-CM | POA: Insufficient documentation

## 2011-08-09 DIAGNOSIS — IMO0001 Reserved for inherently not codable concepts without codable children: Secondary | ICD-10-CM | POA: Insufficient documentation

## 2011-08-12 ENCOUNTER — Ambulatory Visit: Payer: 59 | Admitting: Physical Therapy

## 2011-08-17 ENCOUNTER — Ambulatory Visit: Payer: 59 | Admitting: Physical Therapy

## 2011-08-23 ENCOUNTER — Ambulatory Visit: Payer: 59 | Admitting: Physical Therapy

## 2011-11-23 ENCOUNTER — Encounter: Payer: Self-pay | Admitting: Internal Medicine

## 2011-11-26 ENCOUNTER — Ambulatory Visit (INDEPENDENT_AMBULATORY_CARE_PROVIDER_SITE_OTHER): Payer: 59 | Admitting: Internal Medicine

## 2011-11-26 ENCOUNTER — Encounter: Payer: Self-pay | Admitting: Internal Medicine

## 2011-11-26 VITALS — BP 130/68 | HR 78 | Temp 98.4°F | Ht 64.5 in | Wt 214.0 lb

## 2011-11-26 DIAGNOSIS — Z803 Family history of malignant neoplasm of breast: Secondary | ICD-10-CM

## 2011-11-26 DIAGNOSIS — I10 Essential (primary) hypertension: Secondary | ICD-10-CM

## 2011-11-26 DIAGNOSIS — K219 Gastro-esophageal reflux disease without esophagitis: Secondary | ICD-10-CM

## 2011-11-26 DIAGNOSIS — S9000XA Contusion of unspecified ankle, initial encounter: Secondary | ICD-10-CM

## 2011-11-26 NOTE — Assessment & Plan Note (Signed)
No longer on PPI - fears osteoporosis association occasional symptoms controlled with homeopathic methods (cinnamon, etc) Encouraged H2B as/if needed
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
## 2011-11-26 NOTE — Assessment & Plan Note (Signed)
BP Readings from Last 3 Encounters:  11/26/11 130/68  07/14/11 167/92  07/07/10 144/84   The current medical regimen is effective;  continue present plan and medications.

## 2012-04-28 ENCOUNTER — Encounter: Payer: Self-pay | Admitting: Internal Medicine

## 2012-06-28 DIAGNOSIS — A159 Respiratory tuberculosis unspecified: Secondary | ICD-10-CM

## 2012-06-28 HISTORY — DX: Respiratory tuberculosis unspecified: A15.9

## 2012-08-12 ENCOUNTER — Other Ambulatory Visit: Payer: Self-pay

## 2012-08-14 ENCOUNTER — Encounter: Payer: Self-pay | Admitting: Internal Medicine

## 2012-10-20 ENCOUNTER — Other Ambulatory Visit: Payer: Self-pay | Admitting: Occupational Medicine

## 2012-10-20 ENCOUNTER — Ambulatory Visit: Payer: Self-pay

## 2012-10-20 DIAGNOSIS — R7612 Nonspecific reaction to cell mediated immunity measurement of gamma interferon antigen response without active tuberculosis: Secondary | ICD-10-CM

## 2013-04-03 ENCOUNTER — Other Ambulatory Visit: Payer: Self-pay | Admitting: Interventional Cardiology

## 2013-04-27 ENCOUNTER — Encounter: Payer: Self-pay | Admitting: Internal Medicine

## 2013-04-29 ENCOUNTER — Other Ambulatory Visit: Payer: Self-pay | Admitting: *Deleted

## 2013-04-29 DIAGNOSIS — E785 Hyperlipidemia, unspecified: Secondary | ICD-10-CM

## 2013-05-05 LAB — HM MAMMOGRAPHY

## 2013-05-07 ENCOUNTER — Encounter: Payer: Self-pay | Admitting: Internal Medicine

## 2013-05-22 ENCOUNTER — Other Ambulatory Visit: Payer: Self-pay

## 2013-06-05 ENCOUNTER — Ambulatory Visit: Payer: Self-pay | Admitting: Pharmacist

## 2013-07-13 ENCOUNTER — Encounter: Payer: Self-pay | Admitting: Internal Medicine

## 2013-07-13 ENCOUNTER — Ambulatory Visit (INDEPENDENT_AMBULATORY_CARE_PROVIDER_SITE_OTHER): Payer: 59 | Admitting: Internal Medicine

## 2013-07-13 ENCOUNTER — Other Ambulatory Visit (INDEPENDENT_AMBULATORY_CARE_PROVIDER_SITE_OTHER): Payer: 59

## 2013-07-13 VITALS — BP 140/76 | HR 71 | Temp 99.1°F | Ht 64.5 in | Wt 209.0 lb

## 2013-07-13 DIAGNOSIS — E559 Vitamin D deficiency, unspecified: Secondary | ICD-10-CM

## 2013-07-13 DIAGNOSIS — Z0000 Encounter for general adult medical examination without abnormal findings: Secondary | ICD-10-CM

## 2013-07-13 DIAGNOSIS — E669 Obesity, unspecified: Secondary | ICD-10-CM

## 2013-07-13 DIAGNOSIS — I10 Essential (primary) hypertension: Secondary | ICD-10-CM

## 2013-07-13 LAB — BASIC METABOLIC PANEL
BUN: 11 mg/dL (ref 6–23)
CO2: 29 mEq/L (ref 19–32)
CREATININE: 1 mg/dL (ref 0.4–1.2)
Calcium: 9.4 mg/dL (ref 8.4–10.5)
Chloride: 101 mEq/L (ref 96–112)
GFR: 77.16 mL/min (ref >60.00)
GLUCOSE: 106 mg/dL — AB (ref 70–99)
Potassium: 4 mEq/L (ref 3.5–5.1)
SODIUM: 138 meq/L (ref 135–145)

## 2013-07-13 LAB — LIPID PANEL
CHOLESTEROL: 204 mg/dL — AB (ref 0–200)
HDL: 49.3 mg/dL (ref >39.00)
TRIGLYCERIDES: 86 mg/dL (ref 0.0–149.0)
Total CHOL/HDL Ratio: 4
VLDL: 17.2 mg/dL (ref 0.0–40.0)

## 2013-07-13 LAB — HEPATIC FUNCTION PANEL
ALBUMIN: 3.9 g/dL (ref 3.5–5.2)
ALT: 16 U/L (ref 0–35)
AST: 19 U/L (ref 0–37)
Alkaline Phosphatase: 67 U/L (ref 39–117)
BILIRUBIN DIRECT: 0.1 mg/dL (ref 0.0–0.3)
TOTAL PROTEIN: 7.9 g/dL (ref 6.0–8.3)
Total Bilirubin: 0.6 mg/dL (ref 0.3–1.2)

## 2013-07-13 LAB — CBC WITH DIFFERENTIAL/PLATELET
BASOS PCT: 0.2 % (ref 0.0–3.0)
Basophils Absolute: 0 10*3/uL (ref 0.0–0.1)
EOS ABS: 0.1 10*3/uL (ref 0.0–0.7)
EOS PCT: 1.2 % (ref 0.0–5.0)
HEMATOCRIT: 39.3 % (ref 36.0–46.0)
Hemoglobin: 13 g/dL (ref 12.0–15.0)
LYMPHS PCT: 19.8 % (ref 12.0–46.0)
Lymphs Abs: 2 10*3/uL (ref 0.7–4.0)
MCHC: 33.1 g/dL (ref 30.0–36.0)
MCV: 80.2 fl (ref 78.0–100.0)
MONOS PCT: 4.8 % (ref 3.0–12.0)
Monocytes Absolute: 0.5 10*3/uL (ref 0.1–1.0)
Neutro Abs: 7.6 10*3/uL (ref 1.4–7.7)
Neutrophils Relative %: 74 % (ref 43.0–77.0)
Platelets: 256 10*3/uL (ref 150.0–400.0)
RBC: 4.9 Mil/uL (ref 3.87–5.11)
RDW: 16.5 % — ABNORMAL HIGH (ref 11.5–14.6)
WBC: 10.3 10*3/uL (ref 4.5–10.5)

## 2013-07-13 LAB — URINALYSIS, ROUTINE W REFLEX MICROSCOPIC
BILIRUBIN URINE: NEGATIVE
KETONES UR: NEGATIVE
Nitrite: NEGATIVE
PH: 6 (ref 5.0–8.0)
Specific Gravity, Urine: 1.015 (ref 1.000–1.030)
TOTAL PROTEIN, URINE-UPE24: NEGATIVE
UROBILINOGEN UA: 0.2 (ref 0.0–1.0)
Urine Glucose: NEGATIVE

## 2013-07-13 LAB — TSH: TSH: 1.7 u[IU]/mL (ref 0.35–5.50)

## 2013-07-13 LAB — LDL CHOLESTEROL, DIRECT: Direct LDL: 146.8 mg/dL

## 2013-07-13 NOTE — Assessment & Plan Note (Signed)
BP Readings from Last 3 Encounters:  07/13/13 140/76  11/26/11 130/68  07/14/11 167/92   The current medical regimen is generally effective;  continue present plan and medications.

## 2013-07-13 NOTE — Patient Instructions (Addendum)
It was good to see you today.  We have reviewed your prior records including labs and tests today  Health Maintenance reviewed - we will administer your shingles vaccine at a future date - all other recommended immunizations and age-appropriate screenings are up-to-date.  Test(s) ordered today. Your results will be released to [facility] (or called to you) after review, usually within 72hours after test completion. If any changes need to be made, you will be notified at that same time.  Medications reviewed and updated, no changes recommended at this time.  Work on lifestyle changes as discussed (low fat, low carb, increased protein diet; improved exercise efforts; weight loss) to control sugar, blood pressure and cholesterol levels and/or reduce risk of developing other medical problems. Look into http://vang.[email or web address] or other type of food journal to assist you in this process.  Please schedule followup in 12 months for annual exam and labs, call sooner if problems.  Health Maintenance, Female A healthy lifestyle and preventative care can promote health and wellness.  Maintain regular health, dental, and eye exams.  Eat a healthy diet. Foods like vegetables, fruits, whole grains, low-fat dairy products, and lean protein foods contain the nutrients you need without too many calories. Decrease your intake of foods high in solid fats, added sugars, and salt. Get information about a proper diet from your caregiver, if necessary.  Regular physical exercise is one of the most important things you can do for your health. Most adults should get at least 150 minutes of moderate-intensity exercise (any activity that increases your heart rate and causes you to sweat) each week. In addition, most adults need muscle-strengthening exercises on 2 or more days a week.   Maintain a healthy weight. The body mass index (BMI) is a screening tool to identify possible weight problems. It provides an estimate of body fat  based on height and weight. Your caregiver can help determine your BMI, and can help you achieve or maintain a healthy weight. For adults 20 years and older:  A BMI below 18.5 is considered underweight.  A BMI of 18.5 to 24.9 is normal.  A BMI of 25 to 29.9 is considered overweight.  A BMI of 30 and above is considered obese.  Maintain normal blood lipids and cholesterol by exercising and minimizing your intake of saturated fat. Eat a balanced diet with plenty of fruits and vegetables. Blood tests for lipids and cholesterol should begin at age 8 and be repeated every 5 years. If your lipid or cholesterol levels are high, you are over 50, or you are a high risk for heart disease, you may need your cholesterol levels checked more frequently.Ongoing high lipid and cholesterol levels should be treated with medicines if diet and exercise are not effective.  If you smoke, find out from your caregiver how to quit. If you do not use tobacco, do not start.  Lung cancer screening is recommended for adults aged 19 80 years who are at high risk for developing lung cancer because of a history of smoking. Yearly low-dose computed tomography (CT) is recommended for people who have at least a 30-pack-year history of smoking and are a current smoker or have quit within the past 15 years. A pack year of smoking is smoking an average of 1 pack of cigarettes a day for 1 year (for example: 1 pack a day for 30 years or 2 packs a day for 15 years). Yearly screening should continue until the smoker has stopped smoking for at least  15 years. Yearly screening should also be stopped for people who develop a health problem that would prevent them from having lung cancer treatment.  If you are pregnant, do not drink alcohol. If you are breastfeeding, be very cautious about drinking alcohol. If you are not pregnant and choose to drink alcohol, do not exceed 1 drink per day. One drink is considered to be 12 ounces (355 mL) of  beer, 5 ounces (148 mL) of wine, or 1.5 ounces (44 mL) of liquor.  Avoid use of street drugs. Do not share needles with anyone. Ask for help if you need support or instructions about stopping the use of drugs.  High blood pressure causes heart disease and increases the risk of stroke. Blood pressure should be checked at least every 1 to 2 years. Ongoing high blood pressure should be treated with medicines, if weight loss and exercise are not effective.  If you are 54 to 60 years old, ask your caregiver if you should take aspirin to prevent strokes.  Diabetes screening involves taking a blood sample to check your fasting blood sugar level. This should be done once every 3 years, after age 59, if you are within normal weight and without risk factors for diabetes. Testing should be considered at a younger age or be carried out more frequently if you are overweight and have at least 1 risk factor for diabetes.  Breast cancer screening is essential preventative care for women. You should practice "breast self-awareness." This means understanding the normal appearance and feel of your breasts and may include breast self-examination. Any changes detected, no matter how small, should be reported to a caregiver. Women in their 27s and 30s should have a clinical breast exam (CBE) by a caregiver as part of a regular health exam every 1 to 3 years. After age 42, women should have a CBE every year. Starting at age 24, women should consider having a mammogram (breast X-ray) every year. Women who have a family history of breast cancer should talk to their caregiver about genetic screening. Women at a high risk of breast cancer should talk to their caregiver about having an MRI and a mammogram every year.  Breast cancer gene (BRCA)-related cancer risk assessment is recommended for women who have family members with BRCA-related cancers. BRCA-related cancers include breast, ovarian, tubal, and peritoneal cancers. Having  family members with these cancers may be associated with an increased risk for harmful changes (mutations) in the breast cancer genes BRCA1 and BRCA2. Results of the assessment will determine the need for genetic counseling and BRCA1 and BRCA2 testing.  The Pap test is a screening test for cervical cancer. Women should have a Pap test starting at age 55. Between ages 61 and 38, Pap tests should be repeated every 2 years. Beginning at age 60, you should have a Pap test every 3 years as long as the past 3 Pap tests have been normal. If you had a hysterectomy for a problem that was not cancer or a condition that could lead to cancer, then you no longer need Pap tests. If you are between ages 40 and 1, and you have had normal Pap tests going back 10 years, you no longer need Pap tests. If you have had past treatment for cervical cancer or a condition that could lead to cancer, you need Pap tests and screening for cancer for at least 20 years after your treatment. If Pap tests have been discontinued, risk factors (such as a new  sexual partner) need to be reassessed to determine if screening should be resumed. Some women have medical problems that increase the chance of getting cervical cancer. In these cases, your caregiver may recommend more frequent screening and Pap tests.  The human papillomavirus (HPV) test is an additional test that may be used for cervical cancer screening. The HPV test looks for the virus that can cause the cell changes on the cervix. The cells collected during the Pap test can be tested for HPV. The HPV test could be used to screen women aged 102 years and older, and should be used in women of any age who have unclear Pap test results. After the age of 53, women should have HPV testing at the same frequency as a Pap test.  Colorectal cancer can be detected and often prevented. Most routine colorectal cancer screening begins at the age of 11 and continues through age 3. However, your  caregiver may recommend screening at an earlier age if you have risk factors for colon cancer. On a yearly basis, your caregiver may provide home test kits to check for hidden blood in the stool. Use of a small camera at the end of a tube, to directly examine the colon (sigmoidoscopy or colonoscopy), can detect the earliest forms of colorectal cancer. Talk to your caregiver about this at age 52, when routine screening begins. Direct examination of the colon should be repeated every 5 to 10 years through age 44, unless early forms of pre-cancerous polyps or small growths are found.  Hepatitis C blood testing is recommended for all people born from [birth year] through 1967 and any individual with known risks for hepatitis C.  Practice safe sex. Use condoms and avoid high-risk sexual practices to reduce the spread of sexually transmitted infections (STIs). Sexually active women aged 4 and younger should be checked for Chlamydia, which is a common sexually transmitted infection. Older women with new or multiple partners should also be tested for Chlamydia. Testing for other STIs is recommended if you are sexually active and at increased risk.  Osteoporosis is a disease in which the bones lose minerals and strength with aging. This can result in serious bone fractures. The risk of osteoporosis can be identified using a bone density scan. Women ages 62 and over and women at risk for fractures or osteoporosis should discuss screening with their caregivers. Ask your caregiver whether you should be taking a calcium supplement or vitamin D to reduce the rate of osteoporosis.  Menopause can be associated with physical symptoms and risks. Hormone replacement therapy is available to decrease symptoms and risks. You should talk to your caregiver about whether hormone replacement therapy is right for you.  Use sunscreen. Apply sunscreen liberally and repeatedly throughout the day. You should seek shade when your shadow is  shorter than you. Protect yourself by wearing long sleeves, pants, a wide-brimmed hat, and sunglasses year round, whenever you are outdoors.  Notify your caregiver of new moles or changes in moles, especially if there is a change in shape or color. Also notify your caregiver if a mole is larger than the size of a pencil eraser.  Stay current with your immunizations. Document Released: 12/28/2010 Document Revised: 10/09/2012 Document Reviewed: 12/28/2010 [facility] Patient Information 2014 [place]. Exercise to Lose Weight Exercise and a healthy diet may help you lose weight. Your doctor may suggest specific exercises. EXERCISE IDEAS AND TIPS  Choose low-cost things you enjoy doing, such as walking, bicycling, or exercising to workout videos.  Take stairs instead of the elevator.  Walk during your lunch break.  Park your car further away from work or school.  Go to a gym or an exercise class.  Start with 5 to 10 minutes of exercise each day. Build up to 30 minutes of exercise 4 to 6 days a week.  Wear shoes with good support and comfortable clothes.  Stretch before and after working out.  Work out until you breathe harder and your heart beats faster.  Drink extra water when you exercise.  Do not do so much that you hurt yourself, feel dizzy, or get very short of breath. Exercises that burn about 150 calories:  Running 1  miles in 15 minutes.  Playing volleyball for 45 to 60 minutes.  Washing and waxing a car for 45 to 60 minutes.  Playing touch football for 45 minutes.  Walking 1  miles in 35 minutes.  Pushing a stroller 1  miles in 30 minutes.  Playing basketball for 30 minutes.  Raking leaves for 30 minutes.  Bicycling 5 miles in 30 minutes.  Walking 2 miles in 30 minutes.  Dancing for 30 minutes.  Shoveling snow for 15 minutes.  Swimming laps for 20 minutes.  Walking up stairs for 15 minutes.  Bicycling 4 miles in 15 minutes.  Gardening for 30  to 45 minutes.  Jumping rope for 15 minutes.  Washing windows or floors for 45 to 60 minutes. Document Released: 07/17/2010 Document Revised: 09/06/2011 Document Reviewed: 07/17/2010 [facility] Patient Information 2014 [place], [place].

## 2013-07-13 NOTE — Progress Notes (Signed)
Pre-visit discussion using our clinic review tool. No additional management support is needed unless otherwise documented below in the visit note.  

## 2013-07-13 NOTE — Progress Notes (Signed)
Subjective:    Patient ID: Janice Rodriguez, female    DOB: [birth year], 58 y.o.   MRN: [id number]  HPI  patient is here today for annual physical. Patient feels well and has no complaints.  Past Medical History  Diagnosis Date  . Hypertension   . ASYMPTOMATIC POSTMENOPAUSAL STATUS   . GERD   . HYPERLIPIDEMIA   . HYPERTENSION   . OBESITY   . SCOLIOSIS   . UMBILICAL HERNIORRHAPHY, HX OF     age 73   Family History  Problem Relation Age of Onset  . Breast cancer Mother   . Diabetes Mother   . Colon cancer Father   . Stroke Brother   . Diabetes Brother   . Sarcoidosis Brother   . Diabetes Other   . Hypertension Other   . Hyperlipidemia Other   . Stroke Other   . Heart disease Father   . Heart attack Brother    History  Substance Use Topics  . Smoking status: Passive Smoke Exposure - Never Smoker  . Smokeless tobacco: Not on file     Comment: Married, lives with spouse and occ kid when back at home. 3 grown kids. 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 Alcohol Use: Yes     Comment: occas    Review of Systems  Constitutional: Negative for fatigue and unexpected weight change.  Respiratory: Negative for cough, shortness of breath and wheezing.   Cardiovascular: Negative for chest pain, palpitations and leg swelling.  Gastrointestinal: Negative for nausea, abdominal pain and diarrhea.  Neurological: Negative for dizziness, weakness, light-headedness and headaches.  Psychiatric/Behavioral: Negative for dysphoric mood. The patient is not nervous/anxious.   All other systems reviewed and are negative.       Objective:   Physical Exam BP 140/76  Pulse 71  Temp(Src) 99.1 F (37.3 C) (Oral)  Ht 5' 4.5" (1.638 m)  Wt 209 lb (94.802 kg)  BMI 35.33 kg/m2  SpO2 98% Wt Readings from Last 3 Encounters:  07/13/13 209 lb (94.802 kg)  11/26/11 214 lb (97.07 kg)  07/07/10 219 lb (99.338 kg)   Constitutional: She is obese, but appears well-developed and  well-nourished. No distress.  HENT: Head: Normocephalic and atraumatic. Ears: B TMs ok, no erythema or effusion; Nose: Nose normal. Mouth/Throat: Oropharynx is clear and moist. No oropharyngeal exudate.  Eyes: Conjunctivae and EOM are normal. Pupils are equal, round, and reactive to light. No scleral icterus.  Neck: Normal range of motion. Neck supple. No JVD present. No thyromegaly present.  Cardiovascular: Normal rate, regular rhythm and normal heart sounds.  No murmur heard. No BLE edema. Pulmonary/Chest: Effort normal and breath sounds normal. No respiratory distress. She has no wheezes.  Abdominal: Soft. Bowel sounds are normal. She exhibits no distension. There is no tenderness. no masses Musculoskeletal: Normal range of motion, no joint effusions. No gross deformities Neurological: She is alert and oriented to person, place, and time. No cranial nerve deficit. Coordination, balance, strength, speech and gait are normal.  Skin: Skin is warm and dry. No rash noted. No erythema.  Psychiatric: She has a normal mood and affect. Her behavior is normal. Judgment and thought content normal.   Lab Results  Component Value Date   WBC 5.8 07/07/2010   HGB 12.0 07/07/2010   HCT 36.2 07/07/2010   PLT 257.0 07/07/2010   GLUCOSE 91 07/07/2010   CHOL 218* 07/07/2010   TRIG 117.0 07/07/2010   HDL 39.40 07/07/2010   LDLDIRECT 162.8 07/07/2010  ALT 13 07/07/2010   AST 18 07/07/2010   NA 141 07/07/2010   K 4.2 07/07/2010   CL 104 07/07/2010   CREATININE 0.7 07/07/2010   BUN 13 07/07/2010   CO2 29 07/07/2010   TSH 2.00 07/07/2010   ECG: sinus at 74 beats per minute. PR interval 0.22. No ischemic change or arrhythmia      Assessment & Plan:   CPX/v70.0 Patient has been counseled on age-appropriate routine health concerns for screening and prevention. These are reviewed and up-to-date. Immunizations are up-to-date or declined. Labs ordered and ECG reviewed.  Vitamin D deficiency. Diagnosed November 2014 by  gynecology. Status post 8 weeks high-dose replacement. Recheck level now

## 2013-07-13 NOTE — Assessment & Plan Note (Signed)
Wt Readings from Last 3 Encounters:  07/13/13 209 lb (94.802 kg)  11/26/11 214 lb (97.07 kg)  07/07/10 219 lb (99.338 kg)   The patient is asked to make an attempt to improve diet and exercise patterns to aid in medical management of this problem.

## 2013-07-14 LAB — VITAMIN D 25 HYDROXY (VIT D DEFICIENCY, FRACTURES): VIT D 25 HYDROXY: 48 ng/mL (ref 30–89)

## 2013-09-06 ENCOUNTER — Encounter: Payer: Self-pay | Admitting: Interventional Cardiology

## 2013-09-10 ENCOUNTER — Other Ambulatory Visit: Payer: Self-pay | Admitting: Interventional Cardiology

## 2013-10-10 ENCOUNTER — Ambulatory Visit: Payer: Self-pay | Admitting: Interventional Cardiology

## 2013-11-05 ENCOUNTER — Ambulatory Visit (INDEPENDENT_AMBULATORY_CARE_PROVIDER_SITE_OTHER): Payer: 59 | Admitting: Interventional Cardiology

## 2013-11-05 ENCOUNTER — Encounter: Payer: Self-pay | Admitting: Interventional Cardiology

## 2013-11-05 VITALS — BP 143/76 | HR 66 | Ht 65.0 in | Wt 213.8 lb

## 2013-11-05 DIAGNOSIS — I10 Essential (primary) hypertension: Secondary | ICD-10-CM

## 2013-11-05 DIAGNOSIS — E785 Hyperlipidemia, unspecified: Secondary | ICD-10-CM

## 2013-11-05 DIAGNOSIS — R51 Headache: Secondary | ICD-10-CM

## 2013-11-05 DIAGNOSIS — R002 Palpitations: Secondary | ICD-10-CM

## 2013-11-05 NOTE — Patient Instructions (Signed)
Your physician recommends that you continue on your current medications as directed. Please refer to the Current Medication list given to you today.  Lab Today: [facility]  Your physician has recommended that you wear an event monitor. Event monitors are medical devices that record the heart's electrical activity. Doctors most often [place] these monitors to diagnose arrhythmias. Arrhythmias are problems with the speed or rhythm of the heartbeat. The monitor is a small, portable device. You can wear one while you do your normal daily activities. This is usually used to diagnose what is causing palpitations/syncope (passing out).  Your physician wants you to follow-up in: 1 year with Dr.Smith You will receive a reminder letter in the mail two months in advance. If you don't receive a letter, please call our office to schedule the follow-up appointment.

## 2013-11-05 NOTE — Addendum Note (Signed)
Addended by: Eulis Foster on: 11/05/2013 04:23 PM   Modules accepted: Orders

## 2013-11-05 NOTE — Progress Notes (Signed)
Patient ID: Janice Rodriguez, female   DOB: 16-Apr-1956, 58 y.o.   MRN: [id number]    [address], [place]  [address] Phone: [phone] Fax:  [phone]  Date:  11/05/2013   ID:  Janice Rodriguez, DOB 1955/07/02, MRN [id number]  PCP:  Gwendolyn Grant, MD   ASSESSMENT:  1. Palpitations, rule out atrial fibrillation 2. Hypertension 3. Hyperlipidemia 4. Scoliosis 5. Obesity  PLAN:  1. 30 day event monitor to exclude atrial fibrillation 2. Continue medications as listed 3. 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 These occur usually at rest. She is limited caffeine intake without improvement. She denies chest discomfort, dyspnea, syncope, and other complaints. She has no peripheral edema. No medication side effects. No neurological symptoms.   Wt Readings from Last 3 Encounters:  11/05/13 213 lb 12.8 oz (96.979 kg)  07/13/13 209 lb (94.802 kg)  11/26/11 214 lb (97.07 kg)     Past Medical History  Diagnosis Date  . Hypertension   . ASYMPTOMATIC POSTMENOPAUSAL STATUS   . GERD   . HYPERLIPIDEMIA   . HYPERTENSION   . OBESITY   . SCOLIOSIS   . UMBILICAL HERNIORRHAPHY, HX OF     age 40    Current Outpatient Prescriptions  Medication Sig Dispense Refill  . aspirin 81 MG tablet Take 81 mg by mouth daily.      . calcium carbonate (TUMS - DOSED IN MG ELEMENTAL CALCIUM) 500 MG chewable tablet Chew 1 tablet by mouth as needed for indigestion or heartburn.      . candesartan (ATACAND) 16 MG tablet TAKE 1 TABLET BY MOUTH DAILY  90 tablet  3  . hydrochlorothiazide (MICROZIDE) 12.5 MG capsule TAKE 1 CAPSULE BY MOUTH EVERY MORNING  90 capsule  0  . metoprolol succinate (TOPROL XL) 50 MG 24 hr tablet Take 50 mg by mouth daily. Take with or immediately following a meal.      .  potassium chloride SA (K-DUR,KLOR-CON) 20 MEQ tablet Take 20 mEq by mouth daily.       No current facility-administered medications for this visit.    Allergies:   No Known Allergies  Social History:  The patient  reports that she has been passively smoking.  She does not have any smokeless tobacco history on file. She reports that she drinks alcohol. She reports that she does not use illicit drugs.   ROS:  Please see the history of present illness.   Medication compliance. No edema. She denies orthopnea.   All other systems reviewed and negative.   OBJECTIVE: VS:  BP 143/76  Pulse 66  Ht 5\' 5"  (1.651 m)  Wt 213 lb 12.8 oz (96.979 kg)  BMI 35.58 kg/m2 Well nourished, well developed, in no acute distress, obese HEENT: normal Neck: JVD flat. Carotid bruit absent  Cardiac:  normal S1, S2; RRR; no murmur Lungs:  clear to auscultation bilaterally, no wheezing, rhonchi or rales Abd: soft, nontender, no hepatomegaly Ext: Edema absent. Pulses 2+ and symmetric Skin: warm and dry Neuro:  CNs 2-12 intact, no focal abnormalities noted  EKG:  Not performed       Signed, Illene Labrador III, MD 11/05/2013 4:18 PM

## 2013-11-06 LAB — BASIC METABOLIC PANEL
BUN: 17 mg/dL (ref 6–23)
CHLORIDE: 104 meq/L (ref 96–112)
CO2: 28 meq/L (ref 19–32)
Calcium: 9.2 mg/dL (ref 8.4–10.5)
Creatinine, Ser: 0.8 mg/dL (ref 0.4–1.2)
GFR: 101.26 mL/min (ref >60.00)
Glucose, Bld: 89 mg/dL (ref 70–99)
POTASSIUM: 3.9 meq/L (ref 3.5–5.1)
Sodium: 139 mEq/L (ref 135–145)

## 2013-11-08 ENCOUNTER — Telehealth: Payer: Self-pay

## 2013-11-08 NOTE — Telephone Encounter (Signed)
Message copied by Lamar Laundry on Thu Nov 08, 2013  9:25 AM ------      Message from: Daneen Schick      Created: Wed Nov 07, 2013  8:10 AM       Normal ------

## 2013-11-14 ENCOUNTER — Encounter: Payer: Self-pay | Admitting: *Deleted

## 2013-11-14 ENCOUNTER — Encounter (INDEPENDENT_AMBULATORY_CARE_PROVIDER_SITE_OTHER): Payer: 59

## 2013-11-14 DIAGNOSIS — R002 Palpitations: Secondary | ICD-10-CM

## 2013-11-14 NOTE — Progress Notes (Signed)
Patient ID: Janice Rodriguez, female   DOB: Feb 11, 1956, 58 y.o.   MRN: [id number] [employer] 30 day cardiac event monitor applied to patient.

## 2013-11-26 ENCOUNTER — Other Ambulatory Visit: Payer: Self-pay | Admitting: Interventional Cardiology

## 2013-11-27 ENCOUNTER — Other Ambulatory Visit: Payer: Self-pay | Admitting: Interventional Cardiology

## 2013-11-30 ENCOUNTER — Telehealth: Payer: Self-pay | Admitting: *Deleted

## 2013-11-30 ENCOUNTER — Telehealth: Payer: Self-pay | Admitting: Interventional Cardiology

## 2013-11-30 NOTE — Telephone Encounter (Signed)
Follow up ° ° ° ° °Returning a nurses call °

## 2013-11-30 NOTE — Telephone Encounter (Signed)
Received several readings from [place] showing some Afib,sinus tach.with 6/4 date.  She states she is not having any symptoms.  Yesterday she was having problems all day with the white electrode.  She is wearing the sensitive pads but still having problems with electrode. States she hasn't felt HR being fast.  Denies c/o of dizziness or SOB.  Will give readings to Dr. Tamala Julian

## 2013-12-13 ENCOUNTER — Telehealth: Payer: Self-pay

## 2013-12-13 DIAGNOSIS — I480 Paroxysmal atrial fibrillation: Secondary | ICD-10-CM

## 2013-12-13 NOTE — Telephone Encounter (Signed)
called to give pt cardiac monitor results.pt sts that she is in a meeting and will call back

## 2013-12-13 NOTE — Telephone Encounter (Signed)
pt called back pt aware of cardiac monitor results .Afib.pt given Dr.Smith's recommendations to have an echo and tsh lab.pt adv a sch from out office will call her to schedule.pt agreeable with plan and verbalized understanding.

## 2013-12-18 ENCOUNTER — Other Ambulatory Visit: Payer: Self-pay

## 2013-12-18 ENCOUNTER — Other Ambulatory Visit (HOSPITAL_COMMUNITY): Payer: Self-pay

## 2013-12-24 ENCOUNTER — Other Ambulatory Visit: Payer: Self-pay | Admitting: Interventional Cardiology

## 2013-12-26 DIAGNOSIS — I517 Cardiomegaly: Secondary | ICD-10-CM

## 2013-12-26 HISTORY — DX: Cardiomegaly: I51.7

## 2014-01-02 ENCOUNTER — Ambulatory Visit (HOSPITAL_COMMUNITY): Payer: 59 | Attending: Cardiovascular Disease | Admitting: Radiology

## 2014-01-02 ENCOUNTER — Other Ambulatory Visit (INDEPENDENT_AMBULATORY_CARE_PROVIDER_SITE_OTHER): Payer: 59

## 2014-01-02 DIAGNOSIS — I480 Paroxysmal atrial fibrillation: Secondary | ICD-10-CM

## 2014-01-02 DIAGNOSIS — R002 Palpitations: Secondary | ICD-10-CM | POA: Insufficient documentation

## 2014-01-02 DIAGNOSIS — I4891 Unspecified atrial fibrillation: Secondary | ICD-10-CM

## 2014-01-02 LAB — TSH: TSH: 0.45 u[IU]/mL (ref 0.35–4.50)

## 2014-01-02 NOTE — Progress Notes (Signed)
Echocardiogram performed.  

## 2014-01-07 ENCOUNTER — Telehealth: Payer: Self-pay

## 2014-01-07 NOTE — Telephone Encounter (Signed)
error 

## 2014-01-08 ENCOUNTER — Telehealth: Payer: Self-pay | Admitting: Interventional Cardiology

## 2014-01-08 ENCOUNTER — Telehealth: Payer: Self-pay | Admitting: Cardiology

## 2014-01-08 MED ORDER — METOPROLOL SUCCINATE ER 100 MG PO TB24: ORAL_TABLET | ORAL | Status: DC

## 2014-01-08 NOTE — Telephone Encounter (Signed)
Pt notified. Metoprolol rx sent in.

## 2014-01-08 NOTE — Telephone Encounter (Signed)
New message         Pt would like for amy to call her back

## 2014-01-08 NOTE — Telephone Encounter (Signed)
Message copied by Alcario Drought on Tue Jan 08, 2014  2:10 PM ------      Message from: Daneen Schick      Created: Mon Jan 07, 2014  1:06 PM       Mild LVH and left atrial enlargement. No significant abnormality. Increase metoprolol succinate to 100 mg daily. This should help decrease premature beats, and protect against aggressive development of LVH. ------

## 2014-01-08 NOTE — Telephone Encounter (Signed)
Pt would like to know if she can try Toprol XL 75 mg first to see if her HR tolterates increased dosage. If HR tolerates then she would be willing to try 100 mg qd like you suggested.

## 2014-01-11 NOTE — Telephone Encounter (Signed)
Okay to try 75 mg daily of Metoprolol succ

## 2014-01-14 MED ORDER — METOPROLOL SUCCINATE ER 50 MG PO TB24: ORAL_TABLET | ORAL | Status: DC

## 2014-01-14 NOTE — Telephone Encounter (Signed)
Pt notified, meds updated.

## 2014-03-01 ENCOUNTER — Telehealth: Payer: Self-pay

## 2014-03-01 NOTE — Telephone Encounter (Signed)
lmtcb. pt needs a f/u appt per Dr.Smith

## 2014-03-27 ENCOUNTER — Other Ambulatory Visit: Payer: Self-pay | Admitting: Interventional Cardiology

## 2014-04-12 ENCOUNTER — Other Ambulatory Visit: Payer: Self-pay

## 2014-06-10 ENCOUNTER — Other Ambulatory Visit: Payer: Self-pay | Admitting: Interventional Cardiology

## 2014-07-19 ENCOUNTER — Encounter: Payer: Self-pay | Admitting: Internal Medicine

## 2014-08-06 ENCOUNTER — Other Ambulatory Visit: Payer: Self-pay | Admitting: Interventional Cardiology

## 2014-08-15 ENCOUNTER — Telehealth: Payer: Self-pay | Admitting: Internal Medicine

## 2014-08-15 DIAGNOSIS — Z780 Asymptomatic menopausal state: Secondary | ICD-10-CM

## 2014-08-15 DIAGNOSIS — Z13820 Encounter for screening for osteoporosis: Secondary | ICD-10-CM

## 2014-08-15 DIAGNOSIS — M4120 Other idiopathic scoliosis, site unspecified: Secondary | ICD-10-CM

## 2014-08-15 NOTE — Telephone Encounter (Signed)
Pt request bone density order to be send to [facility] Fax # [phone].

## 2014-08-16 NOTE — Telephone Encounter (Signed)
Order faxed.

## 2014-08-16 NOTE — Telephone Encounter (Signed)
Order done

## 2014-08-31 LAB — HM MAMMOGRAPHY: HM Mammogram: NORMAL

## 2014-09-10 LAB — HM DEXA SCAN: HM Dexa Scan: 0.1

## 2014-09-11 ENCOUNTER — Encounter: Payer: Self-pay | Admitting: Internal Medicine

## 2014-09-17 ENCOUNTER — Encounter: Payer: Self-pay | Admitting: Internal Medicine

## 2014-09-17 ENCOUNTER — Ambulatory Visit (INDEPENDENT_AMBULATORY_CARE_PROVIDER_SITE_OTHER): Payer: 59 | Admitting: Internal Medicine

## 2014-09-17 VITALS — BP 116/72 | HR 79 | Temp 98.0°F | Resp 14 | Ht 65.0 in | Wt 218.0 lb

## 2014-09-17 DIAGNOSIS — R197 Diarrhea, unspecified: Secondary | ICD-10-CM

## 2014-09-17 NOTE — Progress Notes (Signed)
   Subjective:    Patient ID: Janice Rodriguez, female    DOB: 02-18-1956, 59 y.o.   MRN: [id number]  HPI Her symptoms began 09/12/14 as watery stools with 4-7 episodes. Prior to the onset of symptoms she did have a chicken/shrimp spring roll from a [place] takeout.  There was no travel, recent antibiotic ingestion, sick contacts, or ingestion of any other suspicious foods as prodrome.  Her husband ate the same food and has no symptoms. She has not treated this except for eating yogurt. but the symptoms are improving. She's had no diarrhea today.  She has not had nausea, vomiting, or fever with this episode. Prior to the diarrheal stools she has experienced abdominal discomfort described as dull and cramping. That resolved with the passage of diarrhea. There's been no blood or pus in the stool. She does describe some sediment as "granules of sand" 2.  Colonoscopy 2012 revealed diverticulosis. Her father & paternal aunt had colon cancer   Review of Systems   She did have loose stools Feb 9-11 with associated fever, nausea, vomiting.  She did start 50,000 international units of vitamin D twice a day 08/31/14.     Objective:   Physical Exam  Pertinent or positive findings include: She has striking scoliosis of the thoracic spine.  General appearance :adequately nourished; in no distress.  Eyes: No conjunctival inflammation or scleral icterus is present.  Oral exam:  Lips and gums are healthy appearing.There is no oropharyngeal erythema or exudate noted. Dental hygiene is good.  Heart:  Normal rate and regular rhythm. S1 and S2 normal without gallop, murmur, click, rub or other extra sounds    Lungs:Chest clear to auscultation; no wheezes, rhonchi,rales ,or rubs present.No increased work of breathing.   Abdomen: bowel sounds normal, soft and non-tender without masses, organomegaly or hernias noted.  No guarding or rebound. No flank tenderness to percussion.  Vascular : all pulses  equal ; no bruits present.  Skin:Warm & dry.  Intact without suspicious lesions or rashes ; no tenting    Lymphatic: No lymphadenopathy is noted about the head, neck, axilla  Neuro: Strength, tone normal.       Assessment & Plan:  #1 diarrhea; probably viral Plan: see AVS

## 2014-09-17 NOTE — Progress Notes (Signed)
Pre visit review using our clinic review tool, if applicable. No additional management support is needed unless otherwise documented below in the visit note. 

## 2014-09-17 NOTE — Patient Instructions (Signed)
Stay on clear liquids for 48-72 hours or until bowels are normal.This would include  jello, sherbert (NOT ice cream), Lipton's chicken noodle soup(NOT cream based soups),Gatorade Lite, flat Ginger ale (without High Fructose Corn Syrup),dry toast or crackers, baked potato.No milk , dairy or grease until bowels are formed.Flrastor OR Align , a [employer] , daily if stools are loose. Immodium AD for frankly watery stool. Report increasing pain, fever or rectal bleeding

## 2014-09-23 ENCOUNTER — Encounter: Payer: Self-pay | Admitting: Internal Medicine

## 2014-09-23 ENCOUNTER — Other Ambulatory Visit: Payer: Self-pay | Admitting: Interventional Cardiology

## 2014-09-25 ENCOUNTER — Other Ambulatory Visit: Payer: Self-pay

## 2014-09-25 ENCOUNTER — Telehealth: Payer: Self-pay | Admitting: Interventional Cardiology

## 2014-09-25 MED ORDER — CANDESARTAN CILEXETIL 16 MG PO TABS: 16.0000 mg | ORAL_TABLET | Freq: Every day | ORAL | Status: DC

## 2014-09-25 NOTE — Telephone Encounter (Signed)
I did not change this it was already name brand, but I did fix it back to generic

## 2014-09-25 NOTE — Telephone Encounter (Signed)
[facility] calling stating that they received a new rx for pt and need to know if the pt can get the generic of the medication. Please call back and advise.

## 2014-09-25 NOTE — Telephone Encounter (Signed)
Contacted Manuela Schwartz at [facility] and informed her that per Dr Tamala Julian and our pharmacist, it is ok to feel the generic of [place]and for this pt, as this was the original order.  Manuela Schwartz verbalized understanding and gracious for all the assistance provided.

## 2014-10-17 ENCOUNTER — Encounter: Payer: Self-pay | Admitting: Internal Medicine

## 2014-10-17 ENCOUNTER — Ambulatory Visit (INDEPENDENT_AMBULATORY_CARE_PROVIDER_SITE_OTHER): Payer: 59 | Admitting: Internal Medicine

## 2014-10-17 ENCOUNTER — Other Ambulatory Visit (INDEPENDENT_AMBULATORY_CARE_PROVIDER_SITE_OTHER): Payer: 59

## 2014-10-17 VITALS — BP 140/84 | HR 68 | Temp 97.8°F | Ht 65.0 in | Wt 216.5 lb

## 2014-10-17 DIAGNOSIS — E559 Vitamin D deficiency, unspecified: Secondary | ICD-10-CM | POA: Diagnosis not present

## 2014-10-17 DIAGNOSIS — E669 Obesity, unspecified: Secondary | ICD-10-CM

## 2014-10-17 DIAGNOSIS — I10 Essential (primary) hypertension: Secondary | ICD-10-CM

## 2014-10-17 DIAGNOSIS — Z0000 Encounter for general adult medical examination without abnormal findings: Secondary | ICD-10-CM | POA: Diagnosis not present

## 2014-10-17 LAB — URINALYSIS, ROUTINE W REFLEX MICROSCOPIC
Bilirubin Urine: NEGATIVE
Ketones, ur: NEGATIVE
Nitrite: NEGATIVE
SPECIFIC GRAVITY, URINE: 1.01 (ref 1.000–1.030)
Total Protein, Urine: NEGATIVE
UROBILINOGEN UA: 0.2 (ref 0.0–1.0)
Urine Glucose: NEGATIVE
pH: 5.5 (ref 5.0–8.0)

## 2014-10-17 LAB — CBC WITH DIFFERENTIAL/PLATELET
BASOS ABS: 0 10*3/uL (ref 0.0–0.1)
BASOS PCT: 0.5 % (ref 0.0–3.0)
Eosinophils Absolute: 0.3 10*3/uL (ref 0.0–0.7)
Eosinophils Relative: 3.9 % (ref 0.0–5.0)
HCT: 36.4 % (ref 36.0–46.0)
Hemoglobin: 11.9 g/dL — ABNORMAL LOW (ref 12.0–15.0)
LYMPHS PCT: 31.5 % (ref 12.0–46.0)
Lymphs Abs: 2.1 10*3/uL (ref 0.7–4.0)
MCHC: 32.8 g/dL (ref 30.0–36.0)
MCV: 80.5 fl (ref 78.0–100.0)
MONOS PCT: 7.8 % (ref 3.0–12.0)
Monocytes Absolute: 0.5 10*3/uL (ref 0.1–1.0)
Neutro Abs: 3.8 10*3/uL (ref 1.4–7.7)
Neutrophils Relative %: 56.3 % (ref 43.0–77.0)
Platelets: 270 10*3/uL (ref 150.0–400.0)
RBC: 4.53 Mil/uL (ref 3.87–5.11)
RDW: 16.9 % — ABNORMAL HIGH (ref 11.5–15.5)
WBC: 6.8 10*3/uL (ref 4.0–10.5)

## 2014-10-17 LAB — BASIC METABOLIC PANEL
BUN: 10 mg/dL (ref 6–23)
CO2: 29 mEq/L (ref 19–32)
Calcium: 9.9 mg/dL (ref 8.4–10.5)
Chloride: 104 mEq/L (ref 96–112)
Creatinine, Ser: 0.79 mg/dL (ref 0.40–1.20)
GFR: 95.06 mL/min (ref >60.00)
Glucose, Bld: 95 mg/dL (ref 70–99)
POTASSIUM: 4.2 meq/L (ref 3.5–5.1)
Sodium: 140 mEq/L (ref 135–145)

## 2014-10-17 LAB — LIPID PANEL
CHOLESTEROL: 169 mg/dL (ref 0–200)
HDL: 38.7 mg/dL — ABNORMAL LOW (ref >39.00)
LDL Cholesterol: 108 mg/dL — ABNORMAL HIGH (ref 0–99)
NonHDL: 130.3
TRIGLYCERIDES: 113 mg/dL (ref 0.0–149.0)
Total CHOL/HDL Ratio: 4
VLDL: 22.6 mg/dL (ref 0.0–40.0)

## 2014-10-17 LAB — HEPATIC FUNCTION PANEL
ALK PHOS: 66 U/L (ref 39–117)
ALT: 10 U/L (ref 0–35)
AST: 16 U/L (ref 0–37)
Albumin: 4 g/dL (ref 3.5–5.2)
BILIRUBIN DIRECT: 0.2 mg/dL (ref 0.0–0.3)
Total Bilirubin: 0.4 mg/dL (ref 0.2–1.2)
Total Protein: 7.6 g/dL (ref 6.0–8.3)

## 2014-10-17 LAB — TSH: TSH: 1.53 u[IU]/mL (ref 0.35–4.50)

## 2014-10-17 MED ORDER — METOPROLOL SUCCINATE ER 50 MG PO TB24: 75.0000 mg | ORAL_TABLET | Freq: Every day | ORAL | Status: DC

## 2014-10-17 MED ORDER — POTASSIUM CHLORIDE CRYS ER 20 MEQ PO TBCR: 20.0000 meq | EXTENDED_RELEASE_TABLET | Freq: Every day | ORAL | Status: DC

## 2014-10-17 MED ORDER — HYDROCHLOROTHIAZIDE 12.5 MG PO CAPS: 12.5000 mg | ORAL_CAPSULE | Freq: Every morning | ORAL | Status: DC

## 2014-10-17 NOTE — Assessment & Plan Note (Signed)
Wt Readings from Last 3 Encounters:  10/17/14 216 lb 8 oz (98.204 kg)  09/17/14 218 lb 0.4 oz (98.895 kg)  11/05/13 213 lb 12.8 oz (96.979 kg)   The patient is asked to make an attempt to improve diet and exercise patterns to aid in medical management of this problem.

## 2014-10-17 NOTE — Progress Notes (Signed)
Subjective:    Patient ID: Janice Rodriguez, female    DOB: 01/10/1956, 59 y.o.   MRN: [id number]  HPI  patient is here today for annual physical. Patient feels well overall. Also reviewed chronic conditions, interval events and current concerns   Past Medical History  Diagnosis Date  . ASYMPTOMATIC POSTMENOPAUSAL STATUS   . GERD   . HYPERLIPIDEMIA   . HYPERTENSION   . OBESITY   . SCOLIOSIS   . UMBILICAL HERNIORRHAPHY, HX OF     age 23   Family History  Problem Relation Age of Onset  . Breast cancer Mother   . Diabetes Mother 56  . Colon cancer Father 36  . Stroke Brother   . Diabetes Brother   . Sarcoidosis Brother   . Diabetes Other   . Hypertension Other   . Hyperlipidemia Other   . Stroke Father   . Heart disease Father   . Heart attack Brother    History  Substance Use Topics  . Smoking status: Passive Smoke Exposure - Never Smoker  . Smokeless tobacco: Not on file  . Alcohol Use: No     Comment: occas    Review of Systems  Constitutional: Negative for fatigue and unexpected weight change.  Respiratory: Negative for cough, shortness of breath and wheezing.   Cardiovascular: Negative for chest pain, palpitations and leg swelling.  Gastrointestinal: Positive for diarrhea (intermittent sx - lasts 3 days and occurs 1-2/mo since 07/2014, initially with N, never painful, no blood/mucus - ?improved with probiotic). Negative for nausea and abdominal pain.  Neurological: Negative for dizziness, weakness, light-headedness and headaches.  Hematological: Bruises/bleeds easily (chronic but increasing sx).  Psychiatric/Behavioral: Negative for dysphoric mood. The patient is not nervous/anxious.   All other systems reviewed and are negative.      Objective:    Physical Exam  Constitutional: She is oriented to person, place, and time. She appears well-developed and well-nourished. No distress.  obese  HENT:  Head: Normocephalic and atraumatic.  Right Ear:  External ear normal.  Left Ear: External ear normal.  Nose: Nose normal.  Mouth/Throat: Oropharynx is clear and moist. No oropharyngeal exudate.  Eyes: EOM are normal. Pupils are equal, round, and reactive to light. Right eye exhibits no discharge. Left eye exhibits no discharge. No scleral icterus.  Neck: Normal range of motion. Neck supple. No JVD present. No tracheal deviation present. No thyromegaly present.  Cardiovascular: Normal rate, regular rhythm, normal heart sounds and intact distal pulses.  Exam reveals no friction rub.   No murmur heard. Pulmonary/Chest: Effort normal and breath sounds normal. No respiratory distress. She has no wheezes. She has no rales. She exhibits no tenderness.  Abdominal: Soft. Bowel sounds are normal. She exhibits no distension and no mass. There is no tenderness. There is no rebound and no guarding.  Genitourinary:  Defer to gyn  Musculoskeletal: Normal range of motion.  No gross deformities  Lymphadenopathy:    She has no cervical adenopathy.  Neurological: She is alert and oriented to person, place, and time. She has normal reflexes. No cranial nerve deficit.  Skin: Skin is warm and dry. No rash noted. She is not diaphoretic. No erythema.  Psychiatric: She has a normal mood and affect. Her behavior is normal. Judgment and thought content normal.  Nursing note and vitals reviewed.   BP 140/84 mmHg  Pulse 68  Temp(Src) 97.8 F (36.6 C) (Oral)  Ht 5\' 5"  (1.651 m)  Wt 216 lb 8 oz (98.204 kg)  BMI 36.03 kg/m2  SpO2 96% Wt Readings from Last 3 Encounters:  10/17/14 216 lb 8 oz (98.204 kg)  09/17/14 218 lb 0.4 oz (98.895 kg)  11/05/13 213 lb 12.8 oz (96.979 kg)    Lab Results  Component Value Date   WBC 10.3 07/13/2013   HGB 13.0 07/13/2013   HCT 39.3 07/13/2013   PLT 256.0 07/13/2013   GLUCOSE 89 11/05/2013   CHOL 204* 07/13/2013   TRIG 86.0 07/13/2013   HDL 49.30 07/13/2013   LDLDIRECT 146.8 07/13/2013   ALT 16 07/13/2013   AST 19  07/13/2013   NA 139 11/05/2013   K 3.9 11/05/2013   CL 104 11/05/2013   CREATININE 0.8 11/05/2013   BUN 17 11/05/2013   CO2 28 11/05/2013   TSH 0.45 01/02/2014    No results found.     Assessment & Plan:   CPX/z00.00 - Patient has been counseled on age-appropriate routine health concerns for screening and prevention. These are reviewed and up-to-date. Immunizations are up-to-date or declined. Labs ordered and reviewed.  Problem List Items Addressed This Visit    Essential hypertension    BP Readings from Last 3 Encounters:  10/17/14 140/84  09/17/14 116/72  11/05/13 143/76   The current medical regimen is generally effective;  continue present plan and medications.      Relevant Medications   metoprolol succinate (TOPROL-XL) 50 MG 24 hr tablet   hydrochlorothiazide (MICROZIDE) 12.5 MG capsule   Obesity    Wt Readings from Last 3 Encounters:  10/17/14 216 lb 8 oz (98.204 kg)  09/17/14 218 lb 0.4 oz (98.895 kg)  11/05/13 213 lb 12.8 oz (96.979 kg)   The patient is asked to make an attempt to improve diet and exercise patterns to aid in medical management of this problem.       Vitamin D deficiency    Recurrent issues - managed by gyn with repeat course of rx Vit D Advised to start Vit D 2000U/d OTC after completion of this rx round Then recheck level in 6-8wk oj OTC dose to monitor replacement needs       Other Visit Diagnoses    Routine general medical examination at a health care facility    -  Primary    Relevant Orders    Basic metabolic panel    CBC with Differential/Platelet    Hepatic function panel    Lipid panel    TSH    Urinalysis, Routine w reflex microscopic        Gwendolyn Grant, MD

## 2014-10-17 NOTE — Assessment & Plan Note (Signed)
Recurrent issues - managed by gyn with repeat course of rx Vit D Advised to start Vit D 2000U/d OTC after completion of this rx round Then recheck level in 6-8wk oj OTC dose to monitor replacement needs

## 2014-10-17 NOTE — Patient Instructions (Addendum)
It was good to see you today.  We have reviewed your prior records including labs and tests today  Health Maintenance reviewed - all recommended immunizations and age-appropriate screenings are up-to-date.  Test(s) ordered today. Your results will be released to [place] (or called to you) after review, usually within 72hours after test completion. If any changes need to be made, you will be notified at that same time.  Medications reviewed and updated, no changes recommended at this time.  Please schedule followup in 12 months for annual exam and labs, call sooner if problems.  Health Maintenance Adopting a healthy lifestyle and getting preventive care can go a long way to promote health and wellness. Talk with your health care provider about what schedule of regular examinations is right for you. This is a good chance for you to check in with your provider about disease prevention and staying healthy. In between checkups, there are plenty of things you can do on your own. Experts have done a lot of research about which lifestyle changes and preventive measures are most likely to keep you healthy. Ask your health care provider for more information. WEIGHT AND DIET  Eat a healthy diet  Be sure to include plenty of vegetables, fruits, low-fat dairy products, and lean protein.  Do not eat a lot of foods high in solid fats, added sugars, or salt.  Get regular exercise. This is one of the most important things you can do for your health.  Most adults should exercise for at least 150 minutes each week. The exercise should increase your heart rate and make you sweat (moderate-intensity exercise).  Most adults should also do strengthening exercises at least twice a week. This is in addition to the moderate-intensity exercise.  Maintain a healthy weight  Body mass index (BMI) is a measurement that can be used to identify possible weight problems. It estimates body fat based on height and weight.  Your health care provider can help determine your BMI and help you achieve or maintain a healthy weight.  For females 20 years of age and older:   A BMI below 18.5 is considered underweight.  A BMI of 18.5 to 24.9 is normal.  A BMI of 25 to 29.9 is considered overweight.  A BMI of 30 and above is considered obese.  Watch levels of cholesterol and blood lipids  You should start having your blood tested for lipids and cholesterol at 61 years of age, then have this test every 5 years.  You may need to have your cholesterol levels checked more often if:  Your lipid or cholesterol levels are high.  You are older than 61 years of age.  You are at high risk for heart disease.  CANCER SCREENING   Lung Cancer  Lung cancer screening is recommended for adults 1-59 years old who are at high risk for lung cancer because of a history of smoking.  A yearly low-dose CT scan of the lungs is recommended for people who:  Currently smoke.  Have quit within the past 15 years.  Have at least a 30-pack-year history of smoking. A pack year is smoking an average of one pack of cigarettes a day for 1 year.  Yearly screening should continue until it has been 15 years since you quit.  Yearly screening should stop if you develop a health problem that would prevent you from having lung cancer treatment.  Breast Cancer  Practice breast self-awareness. This means understanding how your breasts normally appear and  feel.  It also means doing regular breast self-exams. Let your health care provider know about any changes, no matter how small.  If you are in your 20s or 30s, you should have a clinical breast exam (CBE) by a health care provider every 1-3 years as part of a regular health exam.  If you are 40 or older, have a CBE every year. Also consider having a breast X-ray (mammogram) every year.  If you have a family history of breast cancer, talk to your health care provider about genetic  screening.  If you are at high risk for breast cancer, talk to your health care provider about having an MRI and a mammogram every year.  Breast cancer gene (BRCA) assessment is recommended for women who have family members with BRCA-related cancers. BRCA-related cancers include:  Breast.  Ovarian.  Tubal.  Peritoneal cancers.  Results of the assessment will determine the need for genetic counseling and BRCA1 and BRCA2 testing. Cervical Cancer Routine pelvic examinations to screen for cervical cancer are no longer recommended for nonpregnant women who are considered low risk for cancer of the pelvic organs (ovaries, uterus, and vagina) and who do not have symptoms. A pelvic examination may be necessary if you have symptoms including those associated with pelvic infections. Ask your health care provider if a screening pelvic exam is right for you.   The Pap test is the screening test for cervical cancer for women who are considered at risk.  If you had a hysterectomy for a problem that was not cancer or a condition that could lead to cancer, then you no longer need Pap tests.  If you are older than 65 years, and you have had normal Pap tests for the past 10 years, you no longer need to have Pap tests.  If you have had past treatment for cervical cancer or a condition that could lead to cancer, you need Pap tests and screening for cancer for at least 20 years after your treatment.  If you no longer get a Pap test, assess your risk factors if they change (such as having a new sexual partner). This can affect whether you should start being screened again.  Some women have medical problems that increase their chance of getting cervical cancer. If this is the case for you, your health care provider may recommend more frequent screening and Pap tests.  The human papillomavirus (HPV) test is another test that may be used for cervical cancer screening. The HPV test looks for the virus that can  cause cell changes in the cervix. The cells collected during the Pap test can be tested for HPV.  The HPV test can be used to screen women 30 years of age and older. Getting tested for HPV can extend the interval between normal Pap tests from three to five years.  An HPV test also should be used to screen women of any age who have unclear Pap test results.  After 61 years of age, women should have HPV testing as often as Pap tests.  Colorectal Cancer  This type of cancer can be detected and often prevented.  Routine colorectal cancer screening usually begins at 61 years of age and continues through 61 years of age.  Your health care provider may recommend screening at an earlier age if you have risk factors for colon cancer.  Your health care provider may also recommend using home test kits to check for hidden blood in the stool.  A small camera   at the end of a tube can be used to examine your colon directly (sigmoidoscopy or colonoscopy). This is done to check for the earliest forms of colorectal cancer.  Routine screening usually begins at age 50.  Direct examination of the colon should be repeated every 5-10 years through 61 years of age. However, you may need to be screened more often if early forms of precancerous polyps or small growths are found. Skin Cancer  Check your skin from head to toe regularly.  Tell your health care provider about any new moles or changes in moles, especially if there is a change in a mole's shape or color.  Also tell your health care provider if you have a mole that is larger than the size of a pencil eraser.  Always use sunscreen. Apply sunscreen liberally and repeatedly throughout the day.  Protect yourself by wearing long sleeves, pants, a wide-brimmed hat, and sunglasses whenever you are outside. HEART DISEASE, DIABETES, AND HIGH BLOOD PRESSURE   Have your blood pressure checked at least every 1-2 years. High blood pressure causes heart  disease and increases the risk of stroke.  If you are between 55 years and 79 years old, ask your health care provider if you should take aspirin to prevent strokes.  Have regular diabetes screenings. This involves taking a blood sample to check your fasting blood sugar level.  If you are at a normal weight and have a low risk for diabetes, have this test once every three years after 61 years of age.  If you are overweight and have a high risk for diabetes, consider being tested at a younger age or more often. PREVENTING INFECTION  Hepatitis B  If you have a higher risk for hepatitis B, you should be screened for this virus. You are considered at high risk for hepatitis B if:  You were born in a country where hepatitis B is common. Ask your health care provider which countries are considered high risk.  Your parents were born in a high-risk country, and you have not been immunized against hepatitis B (hepatitis B vaccine).  You have HIV or AIDS.  You use needles to inject street drugs.  You live with someone who has hepatitis B.  You have had sex with someone who has hepatitis B.  You get hemodialysis treatment.  You take certain medicines for conditions, including cancer, organ transplantation, and autoimmune conditions. Hepatitis C  Blood testing is recommended for:  Everyone born from 1945 through 1965.  Anyone with known risk factors for hepatitis C. Sexually transmitted infections (STIs)  You should be screened for sexually transmitted infections (STIs) including gonorrhea and chlamydia if:  You are sexually active and are younger than 61 years of age.  You are older than 61 years of age and your health care provider tells you that you are at risk for this type of infection.  Your sexual activity has changed since you were last screened and you are at an increased risk for chlamydia or gonorrhea. Ask your health care provider if you are at risk.  If you do not have  HIV, but are at risk, it may be recommended that you take a prescription medicine daily to prevent HIV infection. This is called pre-exposure prophylaxis (PrEP). You are considered at risk if:  You are sexually active and do not regularly use condoms or know the HIV status of your partner(s).  You take drugs by injection.  You are sexually active with a partner   who has HIV. Talk with your health care provider about whether you are at high risk of being infected with HIV. If you choose to begin PrEP, you should first be tested for HIV. You should then be tested every 3 months for as long as you are taking PrEP.  PREGNANCY   If you are premenopausal and you may become pregnant, ask your health care provider about preconception counseling.  If you may become pregnant, take 400 to 800 micrograms (mcg) of folic acid every day.  If you want to prevent pregnancy, talk to your health care provider about birth control (contraception). OSTEOPOROSIS AND MENOPAUSE   Osteoporosis is a disease in which the bones lose minerals and strength with aging. This can result in serious bone fractures. Your risk for osteoporosis can be identified using a bone density scan.  If you are 65 years of age or older, or if you are at risk for osteoporosis and fractures, ask your health care provider if you should be screened.  Ask your health care provider whether you should take a calcium or vitamin D supplement to lower your risk for osteoporosis.  Menopause may have certain physical symptoms and risks.  Hormone replacement therapy may reduce some of these symptoms and risks. Talk to your health care provider about whether hormone replacement therapy is right for you.  HOME CARE INSTRUCTIONS   Schedule regular health, dental, and eye exams.  Stay current with your immunizations.   Do not use any tobacco products including cigarettes, chewing tobacco, or electronic cigarettes.  If you are pregnant, do not  drink alcohol.  If you are breastfeeding, limit how much and how often you drink alcohol.  Limit alcohol intake to no more than 1 drink per day for nonpregnant women. One drink equals 12 ounces of beer, 5 ounces of wine, or 1 ounces of hard liquor.  Do not use street drugs.  Do not share needles.  Ask your health care provider for help if you need support or information about quitting drugs.  Tell your health care provider if you often feel depressed.  Tell your health care provider if you have ever been abused or do not feel safe at home. Document Released: 12/28/2010 Document Revised: 10/29/2013 Document Reviewed: 05/16/2013 Exit[employer] Patient Information 2015 [employer]. This information is not intended to replace advice given to you by your health care provider. Make sure you discuss any questions you have with your health care provider.  

## 2014-10-17 NOTE — Progress Notes (Signed)
Pre visit review using our clinic review tool, if applicable. No additional management support is needed unless otherwise documented below in the visit note. 

## 2014-10-17 NOTE — Assessment & Plan Note (Signed)
BP Readings from Last 3 Encounters:  10/17/14 140/84  09/17/14 116/72  11/05/13 143/76   The current medical regimen is generally effective;  continue present plan and medications.

## 2014-10-18 MED ORDER — CIPROFLOXACIN HCL 500 MG PO TABS: 500.0000 mg | ORAL_TABLET | Freq: Two times a day (BID) | ORAL | Status: DC

## 2014-12-13 ENCOUNTER — Ambulatory Visit: Payer: Self-pay | Admitting: Interventional Cardiology

## 2014-12-23 ENCOUNTER — Other Ambulatory Visit: Payer: Self-pay | Admitting: Interventional Cardiology

## 2014-12-25 NOTE — Telephone Encounter (Signed)
Per note 5.11.15

## 2015-01-01 NOTE — Progress Notes (Signed)
Cardiology Office Note   Date:  01/02/2015   ID:  Janice Rodriguez, DOB Oct 27, 1955, MRN [id number]  PCP:  Gwendolyn Grant, MD  Cardiologist:  Sinclair Grooms, MD   Chief Complaint  Patient presents with  . Hypertension      History of Present Illness: Janice Rodriguez is a 59 y.o. female who presents for essential hypertension and palpitations. Last year we made an upward adjustment in beta blocker therapy in palpitations have completely resolved. No side effects on the current medical regimen. No cardiopulmonary complaints.    Past Medical History  Diagnosis Date  . ASYMPTOMATIC POSTMENOPAUSAL STATUS   . GERD   . HYPERLIPIDEMIA   . HYPERTENSION   . OBESITY   . SCOLIOSIS   . UMBILICAL HERNIORRHAPHY, HX OF     age 1    Past Surgical History  Procedure Laterality Date  . Vaginal hysterectomy  1992  . Umbilical hernia repair  1963  . Colonoscopy  2012    Tics  . Ectopic pregnancy surgery  1983     Current Outpatient Prescriptions  Medication Sig Dispense Refill  . aspirin 81 MG tablet Take 81 mg by mouth daily.    . calcium carbonate (TUMS - DOSED IN MG ELEMENTAL CALCIUM) 500 MG chewable tablet Chew 1 tablet by mouth as needed for indigestion or heartburn.    . candesartan (ATACAND) 16 MG tablet TAKE 1 TABLET BY MOUTH DAILY 90 tablet 0  . ergocalciferol (VITAMIN D2) 50000 UNITS capsule Take 50,000 Units by mouth 2 (two) times a week.    . hydrochlorothiazide (MICROZIDE) 12.5 MG capsule Take 1 capsule (12.5 mg total) by mouth every morning. 90 capsule 1  . metoprolol succinate (TOPROL-XL) 50 MG 24 hr tablet Take 75 mg by mouth daily. Take with or immediately following a meal.    . potassium chloride SA (K-DUR,KLOR-CON) 20 MEQ tablet Take 1 tablet (20 mEq total) by mouth daily. 90 tablet 3  . Probiotic Product (PROBIOTIC DAILY PO) Take 1 capsule by mouth daily as needed ((Patient Perference)).      No current facility-administered medications for this  visit.    Allergies:   Review of patient's allergies indicates no known allergies.    Social History:  The patient  reports that she has been passively smoking.  She has never used smokeless tobacco. She reports that she does not drink alcohol or use illicit drugs.   Family History:  The patient's family history includes Breast cancer in her mother; Colon cancer (age of onset: 1) in her father; Diabetes in her brother and other; Diabetes (age of onset: 30) in her mother; Heart attack in her brother; Heart disease in her father; Hyperlipidemia in her other; Hypertension in her other; Sarcoidosis in her brother; Stroke in her brother and father.    ROS:  Please see the history of present illness.   Otherwise, review of systems are positive for none.   All other systems are reviewed and negative.    PHYSICAL EXAM: VS:  BP 142/90 mmHg  Pulse 63  Ht 5\' 5"  (1.651 m)  Wt 98.34 kg (216 lb 12.8 oz)  BMI 36.08 kg/m2 , BMI Body mass index is 36.08 kg/(m^2). GEN: Well nourished, well developed, in no acute distressP rate obese HEENT: normal Neck: no JVD, carotid bruits, or masses Cardiac: RRR; no murmurs, rubs, or gallops,no edema  Respiratory:  clear to auscultation bilaterally, normal work of breathing GI: soft, nontender, nondistended, + BS MS: no deformity or  atrophy Skin: warm and dry, no rash Neuro:  Strength and sensation are intact Psych: euthymic mood, full affect   EKG:  EKG is ordered today. And reveals a normal tracing with normal sinus rhythm at a rate of 63 bpm    Recent Labs: 10/17/2014: ALT 10; BUN 10; Creatinine, Ser 0.79; Hemoglobin 11.9*; Platelets 270.0; Potassium 4.2; Sodium 140; TSH 1.53    Lipid Panel    Component Value Date/Time   CHOL 169 10/17/2014 0909   TRIG 113.0 10/17/2014 0909   HDL 38.70* 10/17/2014 0909   CHOLHDL 4 10/17/2014 0909   VLDL 22.6 10/17/2014 0909   LDLCALC 108* 10/17/2014 0909   LDLDIRECT 146.8 07/13/2013 0859      Wt Readings from  Last 3 Encounters:  01/02/15 98.34 kg (216 lb 12.8 oz)  10/17/14 98.204 kg (216 lb 8 oz)  09/17/14 98.895 kg (218 lb 0.4 oz)      Other studies Reviewed: Additional studies/ records that were reviewed today include: .    ASSESSMENT AND PLAN:  1. Essential hypertension Controlled  2. Moderate obesity Unchanged  3. Palpitations Resolved on beta blocker therapy    Current medicines are reviewed at length with the patient today.  The patient does not have concerns regarding medicines.  The following changes have been made:  no change  Labs/ tests ordered today include:  No orders of the defined types were placed in this encounter.     Disposition:   FU with HS in 1 year  Signed, Sinclair Grooms, MD  01/02/2015 4:54 PM    [facility] [place], [place], [place]  [address] Phone: [phone]; Fax: [phone]

## 2015-01-02 ENCOUNTER — Ambulatory Visit (INDEPENDENT_AMBULATORY_CARE_PROVIDER_SITE_OTHER): Payer: 59 | Admitting: Interventional Cardiology

## 2015-01-02 ENCOUNTER — Encounter: Payer: Self-pay | Admitting: Interventional Cardiology

## 2015-01-02 VITALS — BP 142/90 | HR 63 | Ht 65.0 in | Wt 216.8 lb

## 2015-01-02 DIAGNOSIS — R002 Palpitations: Secondary | ICD-10-CM | POA: Diagnosis not present

## 2015-01-02 DIAGNOSIS — I10 Essential (primary) hypertension: Secondary | ICD-10-CM

## 2015-01-02 DIAGNOSIS — E668 Other obesity: Secondary | ICD-10-CM

## 2015-01-02 NOTE — Patient Instructions (Signed)
Medication Instructions:  Your physician recommends that you continue on your current medications as directed. Please refer to the Current Medication list given to you today.  Labwork: No new orders.   Testing/Procedures: No new orders.   Follow-Up: Your physician wants you to follow-up in: 1 YEAR with Dr Tamala Julian.  You will receive a reminder letter in the mail two months in advance. If you don't receive a letter, please call our office to schedule the follow-up appointment.   Any Other Special Instructions Will Be Listed Below (If Applicable).

## 2015-03-25 ENCOUNTER — Other Ambulatory Visit: Payer: Self-pay | Admitting: Interventional Cardiology

## 2015-05-07 ENCOUNTER — Other Ambulatory Visit: Payer: Self-pay

## 2015-05-07 MED ORDER — METOPROLOL SUCCINATE ER 50 MG PO TB24: ORAL_TABLET | ORAL | Status: DC

## 2015-05-07 NOTE — Telephone Encounter (Signed)
Belva Crome, MD at 01/01/2015 3:51 PM  metoprolol succinate (TOPROL-XL) 50 MG 24 hr tabletTake 75 mg by mouth daily. Take with or immediately following a meal 3. Palpitations Resolved on beta blocker therapy Current medicines are reviewed at length with the patient today. The patient does not have concerns regarding medicines. The following changes have been made: no change

## 2015-05-12 ENCOUNTER — Telehealth: Payer: Self-pay | Admitting: Interventional Cardiology

## 2015-05-12 NOTE — Telephone Encounter (Signed)
Follow up     Returned a cal to the nurse

## 2015-05-12 NOTE — Telephone Encounter (Signed)
New Message   Pt went to get refill and she states that it is not time for her yearly

## 2015-05-12 NOTE — Telephone Encounter (Signed)
Returned pt call.[email or web address] 

## 2015-05-13 MED ORDER — HYDROCHLOROTHIAZIDE 12.5 MG PO CAPS: 12.5000 mg | ORAL_CAPSULE | Freq: Every morning | ORAL | Status: DC

## 2015-05-13 MED ORDER — METOPROLOL SUCCINATE ER 50 MG PO TB24: ORAL_TABLET | ORAL | Status: DC

## 2015-05-13 MED ORDER — CANDESARTAN CILEXETIL 16 MG PO TABS: 16.0000 mg | ORAL_TABLET | Freq: Every day | ORAL | Status: DC

## 2015-05-13 MED ORDER — POTASSIUM CHLORIDE CRYS ER 20 MEQ PO TBCR: 20.0000 meq | EXTENDED_RELEASE_TABLET | Freq: Every day | ORAL | Status: DC

## 2015-05-13 NOTE — Telephone Encounter (Signed)
Returned pt call. Pt rqst  refills on her medications for 90 day supply. Rx sent to pt pharmacy Pt sts that she is doing well . She will see Dr.Smith back as planned in July 2017

## 2015-07-08 MED FILL — METOPROLOL SUCC ER 50 MG TA: 50 | 90 days supply | Qty: 135 | Fill #0

## 2015-08-14 DIAGNOSIS — M654 Radial styloid tenosynovitis [de Quervain]: Secondary | ICD-10-CM | POA: Diagnosis not present

## 2015-09-02 ENCOUNTER — Encounter: Payer: Self-pay | Admitting: Internal Medicine

## 2015-09-03 MED FILL — HYDROCHLOROTHIAZIDE 12.5 MG: 12.5 | 90 days supply | Qty: 90 | Fill #1

## 2015-09-22 MED FILL — KLOR-CON M20 TABLET: 20 | 90 days supply | Qty: 90 | Fill #3

## 2015-09-22 MED FILL — CANDESARTAN CILEXETIL 16 MG: 16 | 90 days supply | Qty: 90 | Fill #2

## 2015-10-06 MED FILL — METOPROLOL SUCC ER 50 MG TA: 50 | 90 days supply | Qty: 135 | Fill #1

## 2015-10-21 ENCOUNTER — Ambulatory Visit (AMBULATORY_SURGERY_CENTER): Payer: Self-pay

## 2015-10-21 ENCOUNTER — Telehealth: Payer: Self-pay

## 2015-10-21 VITALS — Ht 66.0 in | Wt 226.2 lb

## 2015-10-21 DIAGNOSIS — Z860100 Personal history of colon polyps, unspecified: Secondary | ICD-10-CM

## 2015-10-21 NOTE — Telephone Encounter (Signed)
Took herself off of PPI.  Uses TUMS for frequent heartburn and reflux.  Wants EGD added to colon.  Thank you, Angela//PV

## 2015-10-21 NOTE — Progress Notes (Signed)
No allergies to eggs or soy No past problems with anesthesia No home oxygen No diet meds  Has email and internet; refused emmi 

## 2015-10-23 ENCOUNTER — Encounter: Payer: Self-pay | Admitting: Internal Medicine

## 2015-10-24 ENCOUNTER — Telehealth: Payer: Self-pay | Admitting: Internal Medicine

## 2015-10-24 ENCOUNTER — Other Ambulatory Visit: Payer: Self-pay

## 2015-10-24 DIAGNOSIS — K219 Gastro-esophageal reflux disease without esophagitis: Secondary | ICD-10-CM

## 2015-10-24 DIAGNOSIS — Z1211 Encounter for screening for malignant neoplasm of colon: Secondary | ICD-10-CM

## 2015-10-24 NOTE — Telephone Encounter (Signed)
A user error has taken place.

## 2015-10-24 NOTE — Telephone Encounter (Signed)
Patient is calling regarding this previous message - wants to add EGD to colon; please advise.

## 2015-10-24 NOTE — Telephone Encounter (Signed)
OK to add EGD

## 2015-10-24 NOTE — Telephone Encounter (Signed)
EGD added and pt now scheduled for 11/06/15@3 :30pm. Pt aware of appt and updated instructions mailed to pt.

## 2015-10-24 NOTE — Telephone Encounter (Signed)
Dr. Henrene Pastor please see note below from previsit RN. Please advise regarding EGD.

## 2015-10-27 HISTORY — PX: UPPER GASTROINTESTINAL ENDOSCOPY: SHX188

## 2015-10-27 HISTORY — PX: COLONOSCOPY: SHX174

## 2015-10-30 DIAGNOSIS — E559 Vitamin D deficiency, unspecified: Secondary | ICD-10-CM | POA: Diagnosis not present

## 2015-10-30 DIAGNOSIS — Z113 Encounter for screening for infections with a predominantly sexual mode of transmission: Secondary | ICD-10-CM | POA: Diagnosis not present

## 2015-10-30 DIAGNOSIS — Z1272 Encounter for screening for malignant neoplasm of vagina: Secondary | ICD-10-CM | POA: Diagnosis not present

## 2015-10-30 DIAGNOSIS — Z01419 Encounter for gynecological examination (general) (routine) without abnormal findings: Secondary | ICD-10-CM | POA: Diagnosis not present

## 2015-10-30 DIAGNOSIS — Z6838 Body mass index (BMI) 38.0-38.9, adult: Secondary | ICD-10-CM | POA: Diagnosis not present

## 2015-10-30 DIAGNOSIS — N951 Menopausal and female climacteric states: Secondary | ICD-10-CM | POA: Diagnosis not present

## 2015-11-03 ENCOUNTER — Telehealth: Payer: Self-pay | Admitting: Internal Medicine

## 2015-11-03 NOTE — Telephone Encounter (Signed)
Patient was originally instructed for a colon only in pre-visit.  When she decided to add an endo to the procedure Vaughan Basta changed the time of the procedure to accommodate this.  She then reprinted Suprep instructions and sent them to the patient.  We did not realize that she had been inexplicably instructed for a Miralax prep, which Dr. Henrene Pastor does not favor.  Patient is going to come by the office and I will give her a Suprep sample and re instruct her.

## 2015-11-06 ENCOUNTER — Encounter: Payer: Self-pay | Admitting: Internal Medicine

## 2015-11-06 ENCOUNTER — Ambulatory Visit (AMBULATORY_SURGERY_CENTER): Payer: 59 | Admitting: Internal Medicine

## 2015-11-06 VITALS — BP 134/79 | HR 67 | Temp 98.6°F | Resp 18 | Ht 66.0 in | Wt 226.0 lb

## 2015-11-06 DIAGNOSIS — K2100 Gastro-esophageal reflux disease with esophagitis, without bleeding: Secondary | ICD-10-CM

## 2015-11-06 DIAGNOSIS — K219 Gastro-esophageal reflux disease without esophagitis: Secondary | ICD-10-CM

## 2015-11-06 DIAGNOSIS — Z86010 Personal history of colonic polyps: Secondary | ICD-10-CM | POA: Diagnosis not present

## 2015-11-06 DIAGNOSIS — I10 Essential (primary) hypertension: Secondary | ICD-10-CM | POA: Diagnosis not present

## 2015-11-06 DIAGNOSIS — Z800 Family history of malignant neoplasm of digestive organs: Secondary | ICD-10-CM

## 2015-11-06 DIAGNOSIS — K222 Esophageal obstruction: Secondary | ICD-10-CM | POA: Diagnosis not present

## 2015-11-06 MED ORDER — SODIUM CHLORIDE 0.9 % IV SOLN: 500.0000 mL | INTRAVENOUS | Status: DC

## 2015-11-06 NOTE — Progress Notes (Signed)
Report to PACU, RN, vss, BBS= Clear.  

## 2015-11-06 NOTE — Op Note (Signed)
[facility] Patient Name: Janice Rodriguez Procedure Date: 11/06/2015 3:31 PM MRN: [id number] Endoscopist: Docia Chuck. Henrene Pastor , MD Age: 60 Date of Birth: 1955/09/14 Gender: Female Procedure:                Upper GI endoscopy Indications:              Heartburn, Suspected esophageal reflux. Patient                            stop PPI and is having significant symptoms despite                            an acids Medicines:                Monitored Anesthesia Care Procedure:                Pre-Anesthesia Assessment:                           - Prior to the procedure, a History and Physical                            was performed, and patient medications and                            allergies were reviewed. The patient's tolerance of                            previous anesthesia was also reviewed. The risks                            and benefits of the procedure and the sedation                            options and risks were discussed with the patient.                            All questions were answered, and informed consent                            was obtained. Prior Anticoagulants: The patient has                            taken no previous anticoagulant or antiplatelet                            agents. ASA Grade Assessment: II - A patient with                            mild systemic disease. After reviewing the risks                            and benefits, the patient was deemed in  satisfactory condition to undergo the procedure.                           - Prior to the procedure, a History and Physical                            was performed, and patient medications and                            allergies were reviewed. The patient's tolerance of                            previous anesthesia was also reviewed. The risks                            and benefits of the procedure and the sedation                            options  and risks were discussed with the patient.                            All questions were answered, and informed consent                            was obtained. Prior Anticoagulants: The patient has                            taken no previous anticoagulant or antiplatelet                            agents. ASA Grade Assessment: II - A patient with                            mild systemic disease. After reviewing the risks                            and benefits, the patient was deemed in                            satisfactory condition to undergo the procedure.                           After obtaining informed consent, the endoscope was                            passed under direct vision. Throughout the                            procedure, the patient's blood pressure, pulse, and                            oxygen saturations were monitored continuously. The  Model GIF-HQ190 [id number]) scope was introduced                            through the mouth, and advanced to the second part                            of duodenum. The upper GI endoscopy was                            accomplished without difficulty. The patient                            tolerated the procedure well. Scope In: Scope Out: Findings:                 LA Grade A (one or more mucosal breaks less than 5                            mm, not extending between tops of 2 mucosal folds)                            esophagitis was found.                           Distal esophageal stricture present. Ringlike with                            active esophagitis measuring approximate 15 mm in                            diameter.                           The entire examined stomach was normal.                           The examined duodenum was normal.                           The cardia and gastric fundus were normal on                            retroflexion, with small hiatal hernia  noted. Complications:            No immediate complications. Estimated Blood Loss:     Estimated blood loss: none. Impression:               - LA Grade A reflux esophagitis.                           - Esophageal stenosis.                           - Normal stomach.                           - Normal examined duodenum.                           -  No specimens collected. Recommendation:           - Consider resuming Nexium or other PPI control                            reflux symptoms and heal esophagitis.                           - Resume previous diet.                           Docia Chuck Henrene Pastor, MD 11/06/2015 3:42:08 PM This report has been signed electronically. CC Letter to:             Parthenia Ames.D.

## 2015-11-06 NOTE — Patient Instructions (Signed)
YOU HAD AN ENDOSCOPIC PROCEDURE TODAY AT [facility]:   Refer to the procedure report that was given to you for any specific questions about what was found during the examination.  If the procedure report does not answer your questions, please call your gastroenterologist to clarify.  If you requested that your care partner not be given the details of your procedure findings, then the procedure report has been included in a sealed envelope for you to review at your convenience later.  YOU SHOULD EXPECT: Some feelings of bloating in the abdomen. Passage of more gas than usual.  Walking can help get rid of the air that was put into your GI tract during the procedure and reduce the bloating. If you had a lower endoscopy (such as a colonoscopy or flexible sigmoidoscopy) you may notice spotting of blood in your stool or on the toilet paper. If you underwent a bowel prep for your procedure, you may not have a normal bowel movement for a few days.  Please Note:  You might notice some irritation and congestion in your nose or some drainage.  This is from the oxygen used during your procedure.  There is no need for concern and it should clear up in a day or so.  SYMPTOMS TO REPORT IMMEDIATELY:   Following lower endoscopy (colonoscopy or flexible sigmoidoscopy):  Excessive amounts of blood in the stool  Significant tenderness or worsening of abdominal pains  Swelling of the abdomen that is new, acute  Fever of 100F or higher   Following upper endoscopy (EGD)  Vomiting of blood or coffee ground material  New chest pain or pain under the shoulder blades  Painful or persistently difficult swallowing  New shortness of breath  Fever of 100F or higher  Black, tarry-looking stools  For urgent or emergent issues, a gastroenterologist can be reached at any hour by calling [phone].   DIET: Your first meal following the procedure should be a small meal and then it is ok to progress to  your normal diet. Heavy or fried foods are harder to digest and may make you feel nauseous or bloated.  Likewise, meals heavy in dairy and vegetables can increase bloating.  Drink plenty of fluids but you should avoid alcoholic beverages for 24 hours. Increase the fiber in your diet.  ACTIVITY:  You should plan to take it easy for the rest of today and you should NOT DRIVE or use heavy machinery until tomorrow (because of the sedation medicines used during the test).    FOLLOW UP: Our staff will call the number listed on your records the next business day following your procedure to check on you and address any questions or concerns that you may have regarding the information given to you following your procedure. If we do not reach you, we will leave a message.  However, if you are feeling well and you are not experiencing any problems, there is no need to return our call.  We will assume that you have returned to your regular daily activities without incident.  If any biopsies were taken you will be contacted by phone or by letter within the next 1-3 weeks.  Please call us at [phone] if you have not heard about the biopsies in 3 weeks.    SIGNATURES/CONFIDENTIALITY: You and/or your care partner have signed paperwork which will be entered into your electronic medical record.  These signatures attest to the fact that that the information above on your  After Visit Summary has been reviewed and is understood.  Full responsibility of the confidentiality of this discharge information lies with you and/or your care-partner.  You may want to reconsider taking nexium/Omeproazole again per Dr. Henrene Pastor.  Read all of the handouts given to you by your recovery room nurse.

## 2015-11-06 NOTE — Op Note (Signed)
[facility] Patient Name: Janice Rodriguez Procedure Date: 11/06/2015 3:12 PM MRN: [id number] Endoscopist: Docia Chuck. Henrene Pastor , MD Age: 60 Date of Birth: 1955/11/13 Gender: Female Procedure:                Colonoscopy Indications:              Surveillance: Personal history of adenomatous                            polyps on last colonoscopy 5 years ago. Previous                            examination February 2012 with small tubular                            adenoma. Also family history of colon cancer in                            parent in 69s Medicines:                Monitored Anesthesia Care Procedure:                Pre-Anesthesia Assessment:                           - Prior to the procedure, a History and Physical                            was performed, and patient medications and                            allergies were reviewed. The patient's tolerance of                            previous anesthesia was also reviewed. The risks                            and benefits of the procedure and the sedation                            options and risks were discussed with the patient.                            All questions were answered, and informed consent                            was obtained. Prior Anticoagulants: The patient has                            taken no previous anticoagulant or antiplatelet                            agents. ASA Grade Assessment: II - A patient with  mild systemic disease. After reviewing the risks                            and benefits, the patient was deemed in                            satisfactory condition to undergo the procedure.                           After obtaining informed consent, the colonoscope                            was passed under direct vision. Throughout the                            procedure, the patient's blood pressure, pulse, and                            oxygen  saturations were monitored continuously. The                            Model CF-HQ190L [id number]) scope was introduced                            through the anus and advanced to the the cecum,                            identified by appendiceal orifice and ileocecal                            valve. The ileocecal valve, appendiceal orifice,                            and rectum were photographed. The quality of the                            bowel preparation was good. The colonoscopy was                            performed without difficulty. The patient tolerated                            the procedure well. The bowel preparation used was                            SUPREP. Scope In: 3:18:56 PM Scope Out: 3:29:49 PM Scope Withdrawal Time: 0 hours 7 minutes 56 seconds  Total Procedure Duration: 0 hours 10 minutes 53 seconds  Findings:                 Multiple small and large-mouthed diverticula were                            found in the left colon.  The exam was otherwise without abnormality on                            direct and retroflexion views. Complications:            No immediate complications. Estimated blood loss:                            None. Estimated Blood Loss:     Estimated blood loss: none. Impression:               - Diverticulosis in the left colon.                           - The examination was otherwise normal on direct                            and retroflexion views.                           - No specimens collected. Recommendation:           - Repeat colonoscopy in 5 years for surveillance.                           - EGD today please see results. Docia Chuck. Henrene Pastor, MD 11/06/2015 3:37:05 PM This report has been signed electronically. CC Letter to:             Marina Gravel.D.

## 2015-11-07 ENCOUNTER — Telehealth: Payer: Self-pay

## 2015-11-07 NOTE — Telephone Encounter (Signed)

## 2015-11-21 ENCOUNTER — Other Ambulatory Visit (INDEPENDENT_AMBULATORY_CARE_PROVIDER_SITE_OTHER): Payer: 59

## 2015-11-21 ENCOUNTER — Ambulatory Visit (INDEPENDENT_AMBULATORY_CARE_PROVIDER_SITE_OTHER): Payer: 59 | Admitting: Internal Medicine

## 2015-11-21 ENCOUNTER — Encounter: Payer: Self-pay | Admitting: Internal Medicine

## 2015-11-21 VITALS — BP 160/86 | HR 73 | Temp 98.2°F | Resp 16 | Ht 65.0 in | Wt 222.0 lb

## 2015-11-21 DIAGNOSIS — E668 Other obesity: Secondary | ICD-10-CM | POA: Diagnosis not present

## 2015-11-21 DIAGNOSIS — E559 Vitamin D deficiency, unspecified: Secondary | ICD-10-CM | POA: Diagnosis not present

## 2015-11-21 DIAGNOSIS — Z0000 Encounter for general adult medical examination without abnormal findings: Secondary | ICD-10-CM

## 2015-11-21 DIAGNOSIS — E669 Obesity, unspecified: Secondary | ICD-10-CM

## 2015-11-21 DIAGNOSIS — I10 Essential (primary) hypertension: Secondary | ICD-10-CM

## 2015-11-21 LAB — LIPID PANEL
CHOLESTEROL: 195 mg/dL (ref 0–200)
HDL: 33.6 mg/dL — ABNORMAL LOW (ref >39.00)
LDL CALC: 133 mg/dL — AB (ref 0–99)
NonHDL: 161.62
Total CHOL/HDL Ratio: 6
Triglycerides: 142 mg/dL (ref 0.0–149.0)
VLDL: 28.4 mg/dL (ref 0.0–40.0)

## 2015-11-21 LAB — COMPREHENSIVE METABOLIC PANEL
ALBUMIN: 3.9 g/dL (ref 3.5–5.2)
ALT: 9 U/L (ref 0–35)
AST: 14 U/L (ref 0–37)
Alkaline Phosphatase: 74 U/L (ref 39–117)
BUN: 16 mg/dL (ref 6–23)
CALCIUM: 9.5 mg/dL (ref 8.4–10.5)
CHLORIDE: 105 meq/L (ref 96–112)
CO2: 28 mEq/L (ref 19–32)
Creatinine, Ser: 0.73 mg/dL (ref 0.40–1.20)
GFR: 103.76 mL/min (ref >60.00)
Glucose, Bld: 99 mg/dL (ref 70–99)
POTASSIUM: 4 meq/L (ref 3.5–5.1)
SODIUM: 141 meq/L (ref 135–145)
Total Bilirubin: 0.4 mg/dL (ref 0.2–1.2)
Total Protein: 7.3 g/dL (ref 6.0–8.3)

## 2015-11-21 LAB — CBC
HEMATOCRIT: 37.9 % (ref 36.0–46.0)
HEMOGLOBIN: 12.3 g/dL (ref 12.0–15.0)
MCHC: 32.5 g/dL (ref 30.0–36.0)
MCV: 80.9 fl (ref 78.0–100.0)
Platelets: 252 10*3/uL (ref 150.0–400.0)
RBC: 4.68 Mil/uL (ref 3.87–5.11)
RDW: 16.4 % — ABNORMAL HIGH (ref 11.5–15.5)
WBC: 6.3 10*3/uL (ref 4.0–10.5)

## 2015-11-21 LAB — VITAMIN D 25 HYDROXY (VIT D DEFICIENCY, FRACTURES): VITD: 27.95 ng/mL — ABNORMAL LOW (ref 30.00–100.00)

## 2015-11-21 NOTE — Assessment & Plan Note (Signed)
Colonoscopy and mammogram and bone density up to date. Counseled her on the shingles vaccine and the need for regular exercise. Checking labs and adjust as needed.

## 2015-11-21 NOTE — Assessment & Plan Note (Signed)
BP is above goal today on hctz, candesartan, metoprolol. Recheck was closer to normal and she would like to monitor at home before changing medicines.

## 2015-11-21 NOTE — Progress Notes (Signed)
   Subjective:    Patient ID: Janice Rodriguez, female    DOB: 08/26/1955, 60 y.o.   MRN: [id number]  HPI The patient is a 60 YO female coming in for wellness. Recently had her colonoscopy. No new concerns. Non-smoker. Not exercising much. 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   PMH, [facility], social history reviewed and updated.   Review of Systems  Constitutional: Negative for diaphoresis, activity change, appetite change and unexpected weight change.  HENT: Negative.   Eyes: Negative.   Respiratory: Negative for cough, chest tightness and shortness of breath.   Cardiovascular: Negative for chest pain, palpitations and leg swelling.  Gastrointestinal: Negative for nausea, abdominal pain, diarrhea, constipation and abdominal distention.  Musculoskeletal: Negative for myalgias, back pain and arthralgias.  Skin: Negative.   Neurological: Negative for dizziness, syncope, weakness, light-headedness, numbness and headaches.  Psychiatric/Behavioral: Negative.       Objective:   Physical Exam  Constitutional: She is oriented to person, place, and time. She appears well-developed and well-nourished.  Overweight  HENT:  Head: Normocephalic and atraumatic.  Eyes: EOM are normal.  Neck: Normal range of motion.  Cardiovascular: Normal rate and regular rhythm.   Pulmonary/Chest: Effort normal and breath sounds normal. No respiratory distress. She has no wheezes. She has no rales.  Abdominal: Soft. Bowel sounds are normal. She exhibits no distension. There is no tenderness. There is no rebound.  Musculoskeletal: She exhibits no edema.  Neurological: She is alert and oriented to person, place, and time. Coordination normal.  Skin: Skin is warm and dry.  Psychiatric: She has a normal mood and affect.   Filed Vitals:   11/21/15 0929  BP: 162/92  Pulse: 73  Temp: 98.2 F (36.8 C)  TempSrc: Oral  Resp: 16  Height: 5\' 5"  (1.651 m)  Weight: 222 lb (100.699 kg)  SpO2: 98%      Assessment & Plan:

## 2015-11-21 NOTE — Assessment & Plan Note (Signed)
Counseled her on the need for weight loss to help with her blood pressure and GERD. Talked to her about portion and exercise.

## 2015-11-21 NOTE — Progress Notes (Signed)
Pre visit review using our clinic review tool, if applicable. No additional management support is needed unless otherwise documented below in the visit note. 

## 2015-11-21 NOTE — Assessment & Plan Note (Signed)
Checking vitamin D and adjust as needed. 

## 2015-11-21 NOTE — Patient Instructions (Signed)
We are checking the labs today and will send the results on mychart.   Think about getting into more of an exercise routine with 30 minutes of moderate exercise 3-4 times per week. This should be where the breathing is a little fast but you are able to carry on a light conversation.   Health Maintenance, Female Adopting a healthy lifestyle and getting preventive care can go a long way to promote health and wellness. Talk with your health care provider about what schedule of regular examinations is right for you. This is a good chance for you to check in with your provider about disease prevention and staying healthy. In between checkups, there are plenty of things you can do on your own. Experts have done a lot of research about which lifestyle changes and preventive measures are most likely to keep you healthy. Ask your health care provider for more information. WEIGHT AND DIET  Eat a healthy diet  Be sure to include plenty of vegetables, fruits, low-fat dairy products, and lean protein.  Do not eat a lot of foods high in solid fats, added sugars, or salt.  Get regular exercise. This is one of the most important things you can do for your health.  Most adults should exercise for at least 150 minutes each week. The exercise should increase your heart rate and make you sweat (moderate-intensity exercise).  Most adults should also do strengthening exercises at least twice a week. This is in addition to the moderate-intensity exercise.  Maintain a healthy weight  Body mass index (BMI) is a measurement that can be used to identify possible weight problems. It estimates body fat based on height and weight. Your health care provider can help determine your BMI and help you achieve or maintain a healthy weight.  For females 48 years of age and older:   A BMI below 18.5 is considered underweight.  A BMI of 18.5 to 24.9 is normal.  A BMI of 25 to 29.9 is considered overweight.  A BMI of 30  and above is considered obese.  Watch levels of cholesterol and blood lipids  You should start having your blood tested for lipids and cholesterol at 62 years of age, then have this test every 5 years.  You may need to have your cholesterol levels checked more often if:  Your lipid or cholesterol levels are high.  You are older than 62 years of age.  You are at high risk for heart disease.  CANCER SCREENING   Lung Cancer  Lung cancer screening is recommended for adults 36-21 years old who are at high risk for lung cancer because of a history of smoking.  A yearly low-dose CT scan of the lungs is recommended for people who:  Currently smoke.  Have quit within the past 15 years.  Have at least a 30-pack-year history of smoking. A pack year is smoking an average of one pack of cigarettes a day for 1 year.  Yearly screening should continue until it has been 15 years since you quit.  Yearly screening should stop if you develop a health problem that would prevent you from having lung cancer treatment.  Breast Cancer  Practice breast self-awareness. This means understanding how your breasts normally appear and feel.  It also means doing regular breast self-exams. Let your health care provider know about any changes, no matter how small.  If you are in your 20s or 30s, you should have a clinical breast exam (CBE) by a  health care provider every 1-3 years as part of a regular health exam.  If you are 40 or older, have a CBE every year. Also consider having a breast X-ray (mammogram) every year.  If you have a family history of breast cancer, talk to your health care provider about genetic screening.  If you are at high risk for breast cancer, talk to your health care provider about having an MRI and a mammogram every year.  Breast cancer gene (BRCA) assessment is recommended for women who have family members with BRCA-related cancers. BRCA-related cancers  include:  Breast.  Ovarian.  Tubal.  Peritoneal cancers.  Results of the assessment will determine the need for genetic counseling and BRCA1 and BRCA2 testing. Cervical Cancer Your health care provider may recommend that you be screened regularly for cancer of the pelvic organs (ovaries, uterus, and vagina). This screening involves a pelvic examination, including checking for microscopic changes to the surface of your cervix (Pap test). You may be encouraged to have this screening done every 3 years, beginning at age 21.  For women ages 30-65, health care providers may recommend pelvic exams and Pap testing every 3 years, or they may recommend the Pap and pelvic exam, combined with testing for human papilloma virus (HPV), every 5 years. Some types of HPV increase your risk of cervical cancer. Testing for HPV may also be done on women of any age with unclear Pap test results.  Other health care providers may not recommend any screening for nonpregnant women who are considered low risk for pelvic cancer and who do not have symptoms. Ask your health care provider if a screening pelvic exam is right for you.  If you have had past treatment for cervical cancer or a condition that could lead to cancer, you need Pap tests and screening for cancer for at least 20 years after your treatment. If Pap tests have been discontinued, your risk factors (such as having a new sexual partner) need to be reassessed to determine if screening should resume. Some women have medical problems that increase the chance of getting cervical cancer. In these cases, your health care provider may recommend more frequent screening and Pap tests. Colorectal Cancer  This type of cancer can be detected and often prevented.  Routine colorectal cancer screening usually begins at 62 years of age and continues through 62 years of age.  Your health care provider may recommend screening at an earlier age if you have risk factors for  colon cancer.  Your health care provider may also recommend using home test kits to check for hidden blood in the stool.  A small camera at the end of a tube can be used to examine your colon directly (sigmoidoscopy or colonoscopy). This is done to check for the earliest forms of colorectal cancer.  Routine screening usually begins at age 50.  Direct examination of the colon should be repeated every 5-10 years through 62 years of age. However, you may need to be screened more often if early forms of precancerous polyps or small growths are found. Skin Cancer  Check your skin from head to toe regularly.  Tell your health care provider about any new moles or changes in moles, especially if there is a change in a mole's shape or color.  Also tell your health care provider if you have a mole that is larger than the size of a pencil eraser.  Always use sunscreen. Apply sunscreen liberally and repeatedly throughout the day.    Protect yourself by wearing long sleeves, pants, a wide-brimmed hat, and sunglasses whenever you are outside. HEART DISEASE, DIABETES, AND HIGH BLOOD PRESSURE   High blood pressure causes heart disease and increases the risk of stroke. High blood pressure is more likely to develop in:  People who have blood pressure in the high end of the normal range (130-139/85-89 mm Hg).  People who are overweight or obese.  People who are African American.  If you are 18-39 years of age, have your blood pressure checked every 3-5 years. If you are 40 years of age or older, have your blood pressure checked every year. You should have your blood pressure measured twice--once when you are at a hospital or clinic, and once when you are not at a hospital or clinic. Record the average of the two measurements. To check your blood pressure when you are not at a hospital or clinic, you can use:  An automated blood pressure machine at a pharmacy.  A home blood pressure monitor.  If you  are between 55 years and 79 years old, ask your health care provider if you should take aspirin to prevent strokes.  Have regular diabetes screenings. This involves taking a blood sample to check your fasting blood sugar level.  If you are at a normal weight and have a low risk for diabetes, have this test once every three years after 62 years of age.  If you are overweight and have a high risk for diabetes, consider being tested at a younger age or more often. PREVENTING INFECTION  Hepatitis B  If you have a higher risk for hepatitis B, you should be screened for this virus. You are considered at high risk for hepatitis B if:  You were born in a country where hepatitis B is common. Ask your health care provider which countries are considered high risk.  Your parents were born in a high-risk country, and you have not been immunized against hepatitis B (hepatitis B vaccine).  You have HIV or AIDS.  You use needles to inject street drugs.  You live with someone who has hepatitis B.  You have had sex with someone who has hepatitis B.  You get hemodialysis treatment.  You take certain medicines for conditions, including cancer, organ transplantation, and autoimmune conditions. Hepatitis C  Blood testing is recommended for:  Everyone born from 1945 through 1965.  Anyone with known risk factors for hepatitis C. Sexually transmitted infections (STIs)  You should be screened for sexually transmitted infections (STIs) including gonorrhea and chlamydia if:  You are sexually active and are younger than 62 years of age.  You are older than 62 years of age and your health care provider tells you that you are at risk for this type of infection.  Your sexual activity has changed since you were last screened and you are at an increased risk for chlamydia or gonorrhea. Ask your health care provider if you are at risk.  If you do not have HIV, but are at risk, it may be recommended that you  take a prescription medicine daily to prevent HIV infection. This is called pre-exposure prophylaxis (PrEP). You are considered at risk if:  You are sexually active and do not regularly use condoms or know the HIV status of your partner(s).  You take drugs by injection.  You are sexually active with a partner who has HIV. Talk with your health care provider about whether you are at high risk of being infected   with HIV. If you choose to begin PrEP, you should first be tested for HIV. You should then be tested every 3 months for as long as you are taking PrEP.  PREGNANCY   If you are premenopausal and you may become pregnant, ask your health care provider about preconception counseling.  If you may become pregnant, take 400 to 800 micrograms (mcg) of folic acid every day.  If you want to prevent pregnancy, talk to your health care provider about birth control (contraception). OSTEOPOROSIS AND MENOPAUSE   Osteoporosis is a disease in which the bones lose minerals and strength with aging. This can result in serious bone fractures. Your risk for osteoporosis can be identified using a bone density scan.  If you are 28 years of age or older, or if you are at risk for osteoporosis and fractures, ask your health care provider if you should be screened.  Ask your health care provider whether you should take a calcium or vitamin D supplement to lower your risk for osteoporosis.  Menopause may have certain physical symptoms and risks.  Hormone replacement therapy may reduce some of these symptoms and risks. Talk to your health care provider about whether hormone replacement therapy is right for you.  HOME CARE INSTRUCTIONS   Schedule regular health, dental, and eye exams.  Stay current with your immunizations.   Do not use any tobacco products including cigarettes, chewing tobacco, or electronic cigarettes.  If you are pregnant, do not drink alcohol.  If you are breastfeeding, limit how  much and how often you drink alcohol.  Limit alcohol intake to no more than 1 drink per day for nonpregnant women. One drink equals 12 ounces of beer, 5 ounces of wine, or 1 ounces of hard liquor.  Do not use street drugs.  Do not share needles.  Ask your health care provider for help if you need support or information about quitting drugs.  Tell your health care provider if you often feel depressed.  Tell your health care provider if you have ever been abused or do not feel safe at home.   This information is not intended to replace advice given to you by your health care provider. Make sure you discuss any questions you have with your health care provider.   Document Released: 12/28/2010 Document Revised: 07/05/2014 Document Reviewed: 05/16/2013 Elsevier Interactive Patient Education [employer].

## 2015-12-01 MED FILL — HYDROCHLOROTHIAZIDE 12.5 MG: 12.5 | 90 days supply | Qty: 90 | Fill #2

## 2015-12-22 MED FILL — KLOR-CON M20 TABLET: 20 | 90 days supply | Qty: 90 | Fill #0

## 2015-12-22 MED FILL — CANDESARTAN CILEXETIL 16 MG: 16 | 90 days supply | Qty: 90 | Fill #0

## 2015-12-29 MED FILL — METOPROLOL SUCC ER 50 MG TA: 50 | 90 days supply | Qty: 135 | Fill #2

## 2016-01-03 DIAGNOSIS — Z803 Family history of malignant neoplasm of breast: Secondary | ICD-10-CM | POA: Diagnosis not present

## 2016-01-03 DIAGNOSIS — Z1231 Encounter for screening mammogram for malignant neoplasm of breast: Secondary | ICD-10-CM | POA: Diagnosis not present

## 2016-01-03 LAB — HM MAMMOGRAPHY

## 2016-01-16 ENCOUNTER — Encounter: Payer: Self-pay | Admitting: Geriatric Medicine

## 2016-03-02 ENCOUNTER — Other Ambulatory Visit: Payer: Self-pay | Admitting: Interventional Cardiology

## 2016-03-02 MED FILL — HYDROCHLOROTHIAZIDE 12.5 MG: 12.5 | 30 days supply | Qty: 30 | Fill #0

## 2016-03-15 MED FILL — KLOR-CON M20 TABLET: 20 | 90 days supply | Qty: 90 | Fill #1

## 2016-03-22 MED FILL — CANDESARTAN CILEXETIL 16 MG: 16 | 90 days supply | Qty: 90 | Fill #1

## 2016-03-24 ENCOUNTER — Ambulatory Visit (INDEPENDENT_AMBULATORY_CARE_PROVIDER_SITE_OTHER): Payer: 59 | Admitting: Interventional Cardiology

## 2016-03-24 ENCOUNTER — Encounter: Payer: Self-pay | Admitting: Interventional Cardiology

## 2016-03-24 VITALS — BP 140/86 | HR 66 | Ht 66.0 in | Wt 223.8 lb

## 2016-03-24 DIAGNOSIS — I10 Essential (primary) hypertension: Secondary | ICD-10-CM

## 2016-03-24 DIAGNOSIS — E785 Hyperlipidemia, unspecified: Secondary | ICD-10-CM

## 2016-03-24 DIAGNOSIS — R002 Palpitations: Secondary | ICD-10-CM

## 2016-03-24 DIAGNOSIS — E668 Other obesity: Secondary | ICD-10-CM | POA: Diagnosis not present

## 2016-03-24 NOTE — Progress Notes (Signed)
Cardiology Office Note    Date:  03/24/2016   ID:  Janice Rodriguez, DOB May 22, 1956, MRN [id number]  PCP:  Hoyt Koch, MD  Cardiologist: Sinclair Grooms, MD   Chief Complaint  Patient presents with  . Follow-up    Htn    History of Present Illness:  Janice Rodriguez is a 60 y.o. female follow-up of hypertension, premature beats, and family history of premature atherosclerosis.  Janice Rodriguez is doing well. Palpitations significantly decreased after she cut back on caffeine. She is tolerating her current therapy without difficulty. Laboratory data was performed by.a Janice Rodriguez in May.  She denies chest pain.    Past Medical History:  Diagnosis Date  . ASYMPTOMATIC POSTMENOPAUSAL STATUS   . Blood transfusion without reported diagnosis   . GERD   . HYPERLIPIDEMIA   . HYPERTENSION   . OBESITY   . SCOLIOSIS   . UMBILICAL HERNIORRHAPHY, HX OF    age 89    Past Surgical History:  Procedure Laterality Date  . COLONOSCOPY  2012   Tics  . [place]  . [place]  . UPPER GASTROINTESTINAL ENDOSCOPY    . VAGINAL HYSTERECTOMY  1992    Current Medications: Outpatient Medications Prior to Visit  Medication Sig Dispense Refill  . aspirin 81 MG tablet Take 81 mg by mouth daily.    . candesartan (ATACAND) 16 MG tablet Take 1 tablet (16 mg total) by mouth daily. 90 tablet 2  . hydrochlorothiazide (MICROZIDE) 12.5 MG capsule TAKE 1 CAPSULE (12.5 MG TOTAL) BY MOUTH EVERY MORNING. 30 capsule 0  . metoprolol succinate (TOPROL-XL) 50 MG 24 hr tablet Take 75 mg (1 and 1/2 tablet) by mouth daily--Take with or immediately following a meal. 135 tablet 2  . omeprazole (PRILOSEC OTC) 20 MG tablet Take 20 mg by mouth daily.    . potassium chloride SA (K-DUR,KLOR-CON) 20 MEQ tablet Take 1 tablet (20 mEq total) by mouth daily. 90 tablet 2  . Probiotic Product (PROBIOTIC DAILY PO) Take 1 capsule by mouth daily as needed ((Patient Perference)).  Reported on 11/21/2015    . vitamin C (ASCORBIC ACID) 500 MG tablet Take 500 mg by mouth daily.    . calcium carbonate (TUMS - DOSED IN MG ELEMENTAL CALCIUM) 500 MG chewable tablet Chew 1 tablet by mouth as needed for indigestion or heartburn. Reported on 11/21/2015    . ergocalciferol (VITAMIN D2) 50000 UNITS capsule Take 50,000 Units by mouth 2 (two) times a week.     No facility-administered medications prior to visit.      Allergies:   Review of patient's allergies indicates no known allergies.   Social History   Social History  . Marital status: Married    Spouse name: N/A  . Number of children: N/A  . Years of education: N/A   Social History Main Topics  . Smoking status: Passive Smoke Exposure - Never Smoker  . Smokeless tobacco: Never Used  . Alcohol use 0.0 oz/week     Comment: occas wine  . Drug use: No  . Sexual activity: Not Asked   Other Topics Concern  . None   Social History Narrative   Married, lives with spouse and occ kid when back at home. 3 grown kids. 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   ROS:   Please see the history of present illness.    Occasional back discomfort. She has scoliosis.  All other systems reviewed and are negative.   PHYSICAL EXAM:   VS:  BP 140/86   Pulse 66   Ht 5\' 6"  (1.676 m)   Wt 223 lb 12.8 oz (101.5 kg)   BMI 36.12 kg/m    GEN: Well nourished, well developed, in no acute distress  HEENT: normal  Neck: no JVD, carotid bruits, or masses Cardiac: RRR; no murmurs, rubs, or gallops,no edema  Respiratory:  clear to auscultation bilaterally,  normal work of breathing GI: soft, nontender, nondistended, + BS MS: no deformity or atrophy  Skin: warm and dry, no rash Neuro:  Alert and Oriented x 3, Strength and sensation are intact Psych: euthymic mood, full affect  Wt Readings from Last 3 Encounters:  03/24/16 223 lb 12.8 oz (101.5 kg)  11/21/15 222 lb (100.7 kg)  11/06/15 226 lb (102.5 kg)      Studies/Labs Reviewed:   EKG:  EKG  Normal sinus rhythm, left axis deviation, nonspecific T-wave abnormality.  Recent Labs: 11/21/2015: ALT 9; BUN 16; Creatinine, Ser 0.73; Hemoglobin 12.3; Platelets 252.0; Potassium 4.0; Sodium 141   Lipid Panel    Component Value Date/Time   CHOL 195 11/21/2015 1021   TRIG 142.0 11/21/2015 1021   HDL 33.60 (L) 11/21/2015 1021   CHOLHDL 6 11/21/2015 1021   VLDL 28.4 11/21/2015 1021   LDLCALC 133 (H) 11/21/2015 1021   LDLDIRECT 146.8 07/13/2013 0859    Additional studies/ records that were reviewed today include:  Lipids above, noted.    ASSESSMENT:    1. Essential hypertension   2. Palpitations   3. Hyperlipidemia   4. Moderate obesity      PLAN:  In order of problems listed above:  1. 2 g sodium diet. Discussed aerobic activity and weight loss as a measure of nonpharmacologic blood pressure control. We will get a basic metabolic panel performed today. 2. Palpitations resolved on increased beta blocker and cessation of caffeine intake. 3. LDL cholesterol is 133. Given her family history, we should consider primary prevention with cholesterol lowering to at least 100. This will require statin therapy. She has negative feelings about statin therapy.    Medication Adjustments/Labs and Tests Ordered: Current medicines are reviewed at length with the patient today.  Concerns regarding medicines are outlined above.  Medication changes, Labs and Tests ordered today are listed in the Patient Instructions below. Patient Instructions  Medication Instructions:  Your physician  recommends that you continue on your current medications as directed. Please refer to the Current Medication list given to you today.   Labwork: Bmet today   Testing/Procedures: None ordered  Follow-Up: Your physician wants you to follow-up in: 1 year with Dr.Smith You will receive a reminder letter in the mail two months in advance. If you don't receive a letter, please call our office to schedule the follow-up appointment.   Any Other Special Instructions Will Be Listed Below (If Applicable).     If you need a refill on your cardiac medications before your next appointment, please call your pharmacy.      Signed, Sinclair Grooms, MD  03/24/2016 4:34 PM    [facility] [address], [place], [place]  [address] Phone: [phone]; Fax: [phone]

## 2016-03-24 NOTE — Patient Instructions (Addendum)
Medication Instructions:  Your physician recommends that you continue on your current medications as directed. Please refer to the Current Medication list given to you today.   Labwork: Bmet today   Testing/Procedures: None ordered  Follow-Up: Your physician wants you to follow-up in: 1 year with Dr.Smith You will receive a reminder letter in the mail two months in advance. If you don't receive a letter, please call our office to schedule the follow-up appointment.   Any Other Special Instructions Will Be Listed Below (If Applicable).     If you need a refill on your cardiac medications before your next appointment, please call your pharmacy.

## 2016-03-25 LAB — BASIC METABOLIC PANEL
BUN: 13 mg/dL (ref 7–25)
CO2: 25 mmol/L (ref 20–31)
Calcium: 9.3 mg/dL (ref 8.6–10.4)
Chloride: 102 mmol/L (ref 98–110)
Creat: 0.86 mg/dL (ref 0.50–0.99)
GLUCOSE: 89 mg/dL (ref 65–99)
POTASSIUM: 4 mmol/L (ref 3.5–5.3)
SODIUM: 140 mmol/L (ref 135–146)

## 2016-03-29 ENCOUNTER — Other Ambulatory Visit: Payer: Self-pay

## 2016-03-29 MED ORDER — HYDROCHLOROTHIAZIDE 12.5 MG PO CAPS: 12.5000 mg | ORAL_CAPSULE | Freq: Every morning | ORAL | 11 refills | Status: DC

## 2016-03-29 MED ORDER — METOPROLOL SUCCINATE ER 50 MG PO TB24: ORAL_TABLET | ORAL | 3 refills | Status: DC

## 2016-03-29 MED FILL — HYDROCHLOROTHIAZIDE 12.5 MG: 12.5 | 90 days supply | Qty: 90 | Fill #0

## 2016-03-29 MED FILL — METOPROLOL SUCC ER 50 MG TA: 50 | 90 days supply | Qty: 135 | Fill #0

## 2016-06-15 ENCOUNTER — Other Ambulatory Visit: Payer: Self-pay | Admitting: Interventional Cardiology

## 2016-06-15 MED FILL — CANDESARTAN CILEXETIL 16 MG: 16 | 90 days supply | Qty: 90 | Fill #0

## 2016-06-15 MED FILL — KLOR-CON M20 TABLET: 20 | 90 days supply | Qty: 90 | Fill #0

## 2016-06-29 MED FILL — METOPROLOL SUCC ER 50 MG TA: 50 | 90 days supply | Qty: 135 | Fill #1

## 2016-06-29 MED FILL — HYDROCHLOROTHIAZIDE 12.5 MG: 12.5 | 90 days supply | Qty: 90 | Fill #1

## 2016-09-13 MED FILL — CANDESARTAN CILEXETIL 16 MG: 16 | 90 days supply | Qty: 90 | Fill #1

## 2016-09-13 MED FILL — KLOR-CON M20 TABLET: 20 | 90 days supply | Qty: 90 | Fill #1

## 2016-09-27 MED FILL — METOPROLOL SUCC ER 50 MG TA: 50 | 90 days supply | Qty: 135 | Fill #2

## 2016-09-27 MED FILL — HYDROCHLOROTHIAZIDE 12.5 MG: 12.5 | 90 days supply | Qty: 90 | Fill #2

## 2016-12-09 DIAGNOSIS — Z01419 Encounter for gynecological examination (general) (routine) without abnormal findings: Secondary | ICD-10-CM | POA: Diagnosis not present

## 2016-12-13 MED FILL — POTASSIUM CL ER 20 MEQ TABL: 20 | 90 days supply | Qty: 90 | Fill #2

## 2016-12-20 MED FILL — CANDESARTAN CILEXETIL 16 MG: 16 | 90 days supply | Qty: 90 | Fill #2

## 2016-12-20 MED FILL — HYDROCHLOROTHIAZIDE 12.5 MG: 12.5 | 90 days supply | Qty: 90 | Fill #3

## 2016-12-20 MED FILL — METOPROLOL SUCC ER 50 MG TA: 50 | 90 days supply | Qty: 135 | Fill #3

## 2017-02-19 DIAGNOSIS — Z1231 Encounter for screening mammogram for malignant neoplasm of breast: Secondary | ICD-10-CM | POA: Diagnosis not present

## 2017-02-19 DIAGNOSIS — Z803 Family history of malignant neoplasm of breast: Secondary | ICD-10-CM | POA: Diagnosis not present

## 2017-03-08 ENCOUNTER — Other Ambulatory Visit: Payer: Self-pay | Admitting: Interventional Cardiology

## 2017-03-09 MED FILL — POTASSIUM CL ER 20 MEQ TABL: 20 | 90 days supply | Qty: 90 | Fill #0

## 2017-03-15 ENCOUNTER — Other Ambulatory Visit: Payer: Self-pay | Admitting: Interventional Cardiology

## 2017-03-15 MED FILL — CANDESARTAN CILEXETIL 16 MG: 16 | 90 days supply | Qty: 90 | Fill #0

## 2017-03-22 ENCOUNTER — Other Ambulatory Visit: Payer: Self-pay | Admitting: Interventional Cardiology

## 2017-03-23 MED FILL — METOPROLOL SUCC ER 50 MG TA: 50 | 60 days supply | Qty: 90 | Fill #0

## 2017-03-23 MED FILL — HYDROCHLOROTHIAZIDE 12.5 MG: 12.5 | 60 days supply | Qty: 60 | Fill #0

## 2017-03-30 ENCOUNTER — Ambulatory Visit (INDEPENDENT_AMBULATORY_CARE_PROVIDER_SITE_OTHER): Payer: 59 | Admitting: Interventional Cardiology

## 2017-03-30 ENCOUNTER — Encounter: Payer: Self-pay | Admitting: Interventional Cardiology

## 2017-03-30 VITALS — BP 142/92 | HR 66 | Ht 65.0 in | Wt 223.4 lb

## 2017-03-30 DIAGNOSIS — E668 Other obesity: Secondary | ICD-10-CM

## 2017-03-30 DIAGNOSIS — E7849 Other hyperlipidemia: Secondary | ICD-10-CM | POA: Diagnosis not present

## 2017-03-30 DIAGNOSIS — R002 Palpitations: Secondary | ICD-10-CM

## 2017-03-30 DIAGNOSIS — I10 Essential (primary) hypertension: Secondary | ICD-10-CM | POA: Diagnosis not present

## 2017-03-30 MED ORDER — HYDROCHLOROTHIAZIDE 12.5 MG PO CAPS: 12.5000 mg | ORAL_CAPSULE | Freq: Every morning | ORAL | 3 refills | Status: DC

## 2017-03-30 MED ORDER — CANDESARTAN CILEXETIL 16 MG PO TABS: 16.0000 mg | ORAL_TABLET | Freq: Every day | ORAL | 3 refills | Status: DC

## 2017-03-30 MED ORDER — METOPROLOL SUCCINATE ER 50 MG PO TB24: ORAL_TABLET | ORAL | 3 refills | Status: DC

## 2017-03-30 MED ORDER — POTASSIUM CHLORIDE CRYS ER 20 MEQ PO TBCR: 20.0000 meq | EXTENDED_RELEASE_TABLET | Freq: Every day | ORAL | 3 refills | Status: DC

## 2017-03-30 NOTE — Patient Instructions (Signed)

## 2017-03-30 NOTE — Progress Notes (Signed)
Cardiology Office Note    Date:  03/30/2017   ID:  Janice Rodriguez, DOB [birth year], MRN [id number]  PCP:  Hoyt Koch, MD  Cardiologist: Sinclair Grooms, MD   Chief Complaint  Patient presents with  . Hypertension    History of Present Illness:  Janice Rodriguez is a 61 y.o. female follow-up of hypertension, premature beats, and family history of premature atherosclerosis.  [name] is doing well. She denies palpitations. She is compliant with medical therapy. She has not had chest discomfort, dyspnea, or lower extremity swelling. She denies orthopnea. No dizziness or near-syncope.   Past Medical History:  Diagnosis Date  . ASYMPTOMATIC POSTMENOPAUSAL STATUS   . Blood transfusion without reported diagnosis   . GERD   . HYPERLIPIDEMIA   . HYPERTENSION   . OBESITY   . SCOLIOSIS   . UMBILICAL HERNIORRHAPHY, HX OF    age 85    Past Surgical History:  Procedure Laterality Date  . COLONOSCOPY  2012   Tics  . [place]  . [place]  . UPPER GASTROINTESTINAL ENDOSCOPY    . VAGINAL HYSTERECTOMY  1992    Current Medications: Outpatient Medications Prior to Visit  Medication Sig Dispense Refill  . aspirin 81 MG tablet Take 81 mg by mouth daily.    . calcium carbonate (TUMS - DOSED IN MG ELEMENTAL CALCIUM) 500 MG chewable tablet Chew 1 tablet by mouth as needed for indigestion or heartburn. Reported on 11/21/2015    . Cholecalciferol (VITAMIN D3 PO) Take 400 Units by mouth daily.     . Probiotic Product (PROBIOTIC DAILY PO) Take 1 capsule by mouth daily as needed ((Patient Perference)). Reported on 11/21/2015    . vitamin C (ASCORBIC ACID) 500 MG tablet Take 500 mg by mouth daily.    . candesartan (ATACAND) 16 MG tablet TAKE 1 TABLET BY MOUTH ONCE DAILY 90 tablet 0  . hydrochlorothiazide (MICROZIDE) 12.5 MG capsule Take 1 capsule (12.5 mg total) by mouth every morning. Please keep 10-3 at 3 pm appointment tfor more  refills thanks. 30 capsule 1  . metoprolol succinate (TOPROL-XL) 50 MG 24 hr tablet Take 1.5 tablets (75 mg) by mouth daily with or immediately following a meal. Please keep 10-3 at 3 pm appointment tfor more refills thanks. 45 tablet 1  . potassium chloride SA (K-DUR,KLOR-CON) 20 MEQ tablet TAKE 1 TABLET BY MOUTH ONCE DAILY 90 tablet 0  . omeprazole (PRILOSEC OTC) 20 MG tablet Take 20 mg by mouth daily.     No facility-administered medications prior to visit.      Allergies:   Patient has no known allergies.   Social History   Social History  . Marital status: Married    Spouse name: N/A  . Number of children: N/A  . Years of education: N/A   Social History Main Topics  . Smoking status: Passive Smoke Exposure - Never Smoker  . Smokeless tobacco: Never Used  . Alcohol use 0.0 oz/week     Comment: occas wine  . Drug use: No  . Sexual activity: Not Asked   Other Topics Concern  . None   Social History Narrative   Married, lives with spouse and occ kid when back at home. 3 grown kids. 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   ROS:   Please see the history of present illness.    Back and neck discomfort related to kyphoscoliosis.  All other systems reviewed and are negative.   PHYSICAL EXAM:   VS:  BP (!) 142/92 (BP Location: Left Arm)   Pulse 66   Ht 5\' 5"  (1.651 m)   Wt 223 lb 6.4 oz (101.3 kg)   BMI 37.18 kg/m    GEN: Well nourished, well developed, in no acute distress  HEENT: normal  Neck: no JVD, carotid bruits, or masses Cardiac: RRR; no murmurs, rubs, or gallops,no edema    Respiratory:  clear to auscultation bilaterally, normal work of breathing GI: soft, nontender, nondistended, + BS MS: no deformity or atrophy  Skin: warm and dry, no rash Neuro:  Alert and Oriented x 3, Strength and sensation are intact Psych: euthymic mood, full affect  Wt Readings from Last 3 Encounters:  03/30/17 223 lb 6.4 oz (101.3 kg)  03/24/16 223 lb 12.8 oz (101.5 kg)  11/21/15 222 lb (100.7 kg)      Studies/Labs Reviewed:   EKG:  EKG  Normal sinus rhythm with leftward axis. Nonspecific T-wave abnormality.  Recent Labs: No results found for requested labs within last 8760 hours.   Lipid Panel    Component Value Date/Time   CHOL 195 11/21/2015 1021   TRIG 142.0 11/21/2015 1021   HDL 33.60 (L) 11/21/2015 1021   CHOLHDL 6 11/21/2015 1021   VLDL 28.4 11/21/2015 1021   LDLCALC 133 (H) 11/21/2015 1021   LDLDIRECT 146.8 07/13/2013 0859    Additional studies/ records that were reviewed today include:  No new data    ASSESSMENT:    1. Essential hypertension   2. Palpitations   3. Other hyperlipidemia   4. Moderate obesity      PLAN:  In order of problems listed above:  1. Borderline blood pressure control. Ideal blood pressure 130/85 mmHg or less. Decrease salt in diet. Increase physical activity. 2. Palpitations have resolved with up titration of beta blocker therapy over a year ago. 3. Atherogenic lipid panel. She refuses therapy for primary prevention because she is concerned about the impact statin therapy. We will see what this she has blood work reveals when she sees her primary physician. Her LDL target should be less than 100 and given her significant family history of vascular disease on both the paternal and maternal side, I would even consider lower. 4. Encouraged decreased caloric intake and increase physical activity.  Antihypertensive therapy has been refilled. Encouraged to increase physical activity. Increase caloric intake and lose weight.  Follow-up in one year.    Medication Adjustments/Labs and Tests Ordered: Current medicines are reviewed at length with the patient today.  Concerns regarding medicines are outlined above.  Medication changes, Labs and Tests ordered today are listed in the Patient Instructions below. Patient Instructions  Medication Instructions:  Your physician recommends that you continue on your current medications as directed. Please refer to the Current Medication list given to you today.   Labwork: None  Testing/Procedures: None  Follow-Up: Your physician wants you to follow-up in: 1 year with Dr. Tamala Julian.  You will receive a reminder letter in the mail two months in advance. If you don't receive a letter, please call our office  to schedule the follow-up appointment.   Any Other Special Instructions Will Be Listed Below (If Applicable).     If you need a refill on your cardiac medications before your next appointment, please call your pharmacy.      Signed, Sinclair Grooms, MD  03/30/2017 3:47 PM    [facility] [address], [place], [place]  [address] Phone: [phone]; Fax: [phone]

## 2017-05-23 MED FILL — METOPROLOL SUCC ER 50 MG TA: 50 | 90 days supply | Qty: 135 | Fill #0 | Status: TO

## 2017-05-23 MED FILL — HYDROCHLOROTHIAZIDE 12.5 MG: 12.5 | 90 days supply | Qty: 90 | Fill #0 | Status: TO

## 2017-06-07 MED FILL — POTASSIUM CL ER 20 MEQ TABL: 20 | 90 days supply | Qty: 90 | Fill #0 | Status: TO

## 2017-06-14 MED FILL — CANDESARTAN CILEXETIL 16 MG: 16 | 90 days supply | Qty: 90 | Fill #0 | Status: TO

## 2017-08-22 MED FILL — METOPROLOL SUCC ER 50 MG TA: 50 | 90 days supply | Qty: 135 | Fill #0

## 2017-08-22 MED FILL — HYDROCHLOROTHIAZIDE 12.5 MG: 12.5 | 90 days supply | Qty: 90 | Fill #0

## 2017-09-05 MED FILL — POTASSIUM CL ER 20 MEQ TABL: 20 | 90 days supply | Qty: 90 | Fill #0

## 2017-09-13 MED FILL — CANDESARTAN CILEXETIL 16 MG: 16 | 90 days supply | Qty: 90 | Fill #0

## 2017-11-21 MED FILL — HYDROCHLOROTHIAZIDE 12.5 MG: 12.5 | 90 days supply | Qty: 90 | Fill #1

## 2017-11-22 MED FILL — METOPROLOL SUCCINATE ER 50: 50 | 90 days supply | Qty: 135 | Fill #1

## 2017-12-07 MED FILL — CANDESARTAN CILEXETIL 16 MG: 16 | 90 days supply | Qty: 90 | Fill #1

## 2017-12-07 MED FILL — POTASSIUM CL ER 20 MEQ TABL: 20 | 90 days supply | Qty: 90 | Fill #1

## 2018-01-17 DIAGNOSIS — Z124 Encounter for screening for malignant neoplasm of cervix: Secondary | ICD-10-CM | POA: Diagnosis not present

## 2018-01-17 DIAGNOSIS — Z01419 Encounter for gynecological examination (general) (routine) without abnormal findings: Secondary | ICD-10-CM | POA: Diagnosis not present

## 2018-01-17 DIAGNOSIS — Z6836 Body mass index (BMI) 36.0-36.9, adult: Secondary | ICD-10-CM | POA: Diagnosis not present

## 2018-01-24 DIAGNOSIS — L819 Disorder of pigmentation, unspecified: Secondary | ICD-10-CM | POA: Diagnosis not present

## 2018-01-24 DIAGNOSIS — E559 Vitamin D deficiency, unspecified: Secondary | ICD-10-CM | POA: Diagnosis not present

## 2018-01-24 DIAGNOSIS — K219 Gastro-esophageal reflux disease without esophagitis: Secondary | ICD-10-CM | POA: Diagnosis not present

## 2018-01-24 DIAGNOSIS — I10 Essential (primary) hypertension: Secondary | ICD-10-CM | POA: Diagnosis not present

## 2018-01-26 DIAGNOSIS — I10 Essential (primary) hypertension: Secondary | ICD-10-CM | POA: Diagnosis not present

## 2018-01-26 DIAGNOSIS — K219 Gastro-esophageal reflux disease without esophagitis: Secondary | ICD-10-CM | POA: Diagnosis not present

## 2018-01-26 DIAGNOSIS — E559 Vitamin D deficiency, unspecified: Secondary | ICD-10-CM | POA: Diagnosis not present

## 2018-02-16 DIAGNOSIS — L918 Other hypertrophic disorders of the skin: Secondary | ICD-10-CM | POA: Diagnosis not present

## 2018-02-16 DIAGNOSIS — L821 Other seborrheic keratosis: Secondary | ICD-10-CM | POA: Diagnosis not present

## 2018-02-20 MED FILL — HYDROCHLOROTHIAZIDE 12.5 MG: 12.5 | 90 days supply | Qty: 90 | Fill #2

## 2018-02-20 MED FILL — METOPROLOL SUCCINATE ER 50: 50 | 90 days supply | Qty: 135 | Fill #2

## 2018-03-04 MED FILL — POTASSIUM CL ER 20 MEQ TABL: 20 | 90 days supply | Qty: 90 | Fill #2

## 2018-03-13 MED FILL — CANDESARTAN CILEXETIL 16 MG: 16 | 90 days supply | Qty: 90 | Fill #2

## 2018-04-08 DIAGNOSIS — Z1231 Encounter for screening mammogram for malignant neoplasm of breast: Secondary | ICD-10-CM | POA: Diagnosis not present

## 2018-04-08 DIAGNOSIS — Z803 Family history of malignant neoplasm of breast: Secondary | ICD-10-CM | POA: Diagnosis not present

## 2018-04-18 ENCOUNTER — Encounter: Payer: Self-pay | Admitting: Interventional Cardiology

## 2018-04-18 ENCOUNTER — Ambulatory Visit: Payer: 59 | Admitting: Interventional Cardiology

## 2018-04-18 VITALS — BP 156/98 | HR 58 | Ht 65.5 in | Wt 214.0 lb

## 2018-04-18 DIAGNOSIS — R002 Palpitations: Secondary | ICD-10-CM

## 2018-04-18 DIAGNOSIS — E7849 Other hyperlipidemia: Secondary | ICD-10-CM | POA: Diagnosis not present

## 2018-04-18 DIAGNOSIS — I10 Essential (primary) hypertension: Secondary | ICD-10-CM | POA: Diagnosis not present

## 2018-04-18 DIAGNOSIS — I493 Ventricular premature depolarization: Secondary | ICD-10-CM

## 2018-04-18 MED ORDER — POTASSIUM CHLORIDE CRYS ER 20 MEQ PO TBCR: 20.0000 meq | EXTENDED_RELEASE_TABLET | Freq: Every day | ORAL | 3 refills | Status: DC

## 2018-04-18 MED ORDER — HYDROCHLOROTHIAZIDE 12.5 MG PO CAPS: 12.5000 mg | ORAL_CAPSULE | Freq: Every morning | ORAL | 3 refills | Status: DC

## 2018-04-18 MED ORDER — METOPROLOL SUCCINATE ER 50 MG PO TB24: ORAL_TABLET | ORAL | 3 refills | Status: DC

## 2018-04-18 NOTE — Patient Instructions (Signed)
Medication Instructions:  Your physician recommends that you continue on your current medications as directed. Please refer to the Current Medication list given to you today.  If you need a refill on your cardiac medications before your next appointment, please call your pharmacy.   Lab work: None If you have labs (blood work) drawn today and your tests are completely normal, you will receive your results only by: Marland Kitchen MyChart Message (if you have MyChart) OR . A paper copy in the mail If you have any lab test that is abnormal or we need to change your treatment, we will call you to review the results.  Testing/Procedures: Your physician recommends that you have a Calcium Score completed.  Follow-Up: At [facility], you and your health needs are our priority.  As part of our continuing mission to provide you with exceptional heart care, we have created designated Provider Care Teams.  These Care Teams include your primary Cardiologist (physician) and Advanced Practice Providers (APPs -  Physician Assistants and Nurse Practitioners) who all work together to provide you with the care you need, when you need it. You will need a follow up appointment in 12 months.  Please call our office 2 months in advance to schedule this appointment.  You may see Sinclair Grooms, MD or one of the following Advanced Practice Providers on your designated Care Team:   Truitt Merle, NP Cecilie Kicks, NP . Kathyrn Drown, NP  Any Other Special Instructions Will Be Listed Below (If Applicable).

## 2018-04-18 NOTE — Progress Notes (Signed)
Cardiology Office Note:    Date:  04/18/2018   ID:  Janice Rodriguez, DOB [birth year], MRN [id number]  PCP:  Hoyt Koch, MD  Cardiologist:  Sinclair Grooms, MD   Referring MD: Hoyt Koch, *   Chief Complaint  Patient presents with  . Advice Only    Primary risk prevention  Risk factors for CAD including family history  History of Present Illness:    Janice Rodriguez is a 62 y.o. female with a hx of hypertension, hyperlipidemia, premature beats, and family history of premature atherosclerosis.  Janice Rodriguez has no cardiac complaints.  She denies chest pain, orthopnea, PND, and rare palpitations.  She denies lower extremity swelling, claudication, orthopnea, and PND.  Both her mother and father had premature coronary disease.  Her brother had sarcoidosis and died suddenly.  It is felt that he had cardiac sarcoid.  Past Medical History:  Diagnosis Date  . ASYMPTOMATIC POSTMENOPAUSAL STATUS   . Blood transfusion without reported diagnosis   . GERD   . HYPERLIPIDEMIA   . HYPERTENSION   . OBESITY   . SCOLIOSIS   . UMBILICAL HERNIORRHAPHY, HX OF    age 36    Past Surgical History:  Procedure Laterality Date  . COLONOSCOPY  2012   Tics  . [place]  . [place]  . UPPER GASTROINTESTINAL ENDOSCOPY    . VAGINAL HYSTERECTOMY  1992    Current Medications: Current Meds  Medication Sig  . aspirin 81 MG tablet Take 81 mg by mouth daily.  . calcium carbonate (TUMS - DOSED IN MG ELEMENTAL CALCIUM) 500 MG chewable tablet Chew 1 tablet by mouth as needed for indigestion or heartburn. Reported on 11/21/2015  . candesartan (ATACAND) 16 MG tablet Take 1 tablet (16 mg total) by mouth daily.  . Cholecalciferol (VITAMIN D3 PO) Take 1 tablet by mouth daily.   . Esomeprazole Magnesium (NEXIUM PO) Take 1 capsule by mouth daily.  . hydrochlorothiazide (MICROZIDE) 12.5 MG capsule Take 1 capsule (12.5 mg total) by mouth every  morning.  . metoprolol succinate (TOPROL-XL) 50 MG 24 hr tablet Take 1.5 tablets (75 mg) by mouth daily with or immediately following a meal.  . potassium chloride SA (K-DUR,KLOR-CON) 20 MEQ tablet Take 1 tablet (20 mEq total) by mouth daily.  . Probiotic Product (PROBIOTIC DAILY PO) Take 1 capsule by mouth daily as needed ((Patient Perference)). Reported on 11/21/2015  . vitamin C (ASCORBIC ACID) 500 MG tablet Take 500 mg by mouth daily.  . [DISCONTINUED] hydrochlorothiazide (MICROZIDE) 12.5 MG capsule Take 1 capsule (12.5 mg total) by mouth every morning.  . [DISCONTINUED] metoprolol succinate (TOPROL-XL) 50 MG 24 hr tablet Take 1.5 tablets (75 mg) by mouth daily with or immediately following a meal.  . [DISCONTINUED] potassium chloride SA (K-DUR,KLOR-CON) 20 MEQ tablet Take 1 tablet (20 mEq total) by mouth daily.     Allergies:   Patient has no known allergies.   Social History   Socioeconomic History  . Marital status: Married    Spouse name: Not on file  . Number of children: Not on file  . Years of education: Not on file  . Highest education level: Not on file  Occupational History  . Not on file  Social Needs  . Financial resource strain: Not on file  . Food insecurity:    Worry: Not on file    Inability: Not on file  . Transportation needs:    Medical: Not  on file    Non-medical: Not on file  Tobacco Use  . Smoking status: Passive Smoke Exposure - Never Smoker  . Smokeless tobacco: Never Used  Substance and Sexual Activity  . Alcohol use: Yes    Alcohol/week: 0.0 standard drinks    Comment: occas wine  . Drug use: No  . Sexual activity: Not on file  Lifestyle  . Physical activity:    Days per week: Not on file    Minutes per session: Not on file  . Stress: Not on file  Relationships  . Social connections:    Talks on phone: Not on file    Gets together: Not on file    Attends religious service: Not on file    Active member of club or organization: Not on file      Attends meetings of clubs or organizations: Not on file    Relationship status: Not on file  Other Topics Concern  . Not on file  Social History Narrative   Married, lives with spouse and occ kid when back at home. 3 grown kids. 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 There is no history of Esophageal cancer, Pancreatic cancer, Rectal cancer, or Stomach cancer.  ROS:   Please see the history of present illness.    Less active.  Works from home.  The only physical activity is when she goes to walk her dog daily.  Some easy bruising.  All other systems reviewed and are negative.  EKGs/Labs/Other Studies Reviewed:    The following studies were reviewed today: No new data.  Reviewed laboratory from her primary physician Dr.Dana Theda Sers of [facility].  LDL cholesterol 148 total to 11 hemoglobin A1c 4.8 BUN, creatinine, and hemoglobin were normal.  EKG:  EKG is  ordered today.  The ekg is personally reviewed and is essentially normal other than mild bradycardia 58 bpm.  Nonspecific T wave changes are noted.  PR interval is 204.  Recent Labs: No results found for requested labs within last 8760 hours.  Recent Lipid Panel    Component Value Date/Time   CHOL 195 11/21/2015 1021   TRIG 142.0 11/21/2015 1021   HDL 33.60 (L) 11/21/2015 1021   CHOLHDL 6 11/21/2015 1021   VLDL 28.4 11/21/2015 1021   LDLCALC 133 (H) 11/21/2015 1021   LDLDIRECT 146.8 07/13/2013 0859    Physical Exam:    VS:  BP (!) 156/98   Pulse (!) 58   Ht 5' 5.5" (1.664 m)   Wt 214 lb (97.1 kg)   BMI 35.07 kg/m      Wt Readings from Last 3 Encounters:  04/18/18 214 lb (97.1 kg)  03/30/17 223 lb 6.4 oz (101.3 kg)  03/24/16 223 lb 12.8 oz (101.5 kg)    GEN:  Well nourished, well developed in no acute distress HEENT: Normal NECK: No JVD. LYMPHATICS: No lymphadenopathy CARDIAC: RRR, no murmur, no gallop, no edema. VASCULAR: 2+ bilateral radial and carotid pulses.  No bruits. RESPIRATORY:  Clear to auscultation without rales, wheezing or rhonchi  ABDOMEN: Soft, non-tender, non-distended, No pulsatile mass, MUSCULOSKELETAL: No deformity  SKIN:  Warm and dry NEUROLOGIC:  Alert and oriented x 3 PSYCHIATRIC:  Normal affect   ASSESSMENT:    1. Premature ventricular contractions   2. Essential hypertension   3. Other hyperlipidemia   4. Morbid obesity ([place])   5. Palpitations    PLAN:    In order of problems listed above:  1. No premature beats are noted on today's electrocardiogram.  She denies complaints.   2. Low-salt diet.  Target 130/80 mmHg.  Continue current therapy.  Repeat blood pressure today showed improvement compared to the initial evaluation. 3. LDL cholesterol is elevated and not being treated.  Is resistant to consideration of statin therapy because of prior knowledge of side effect profile.  We discussed further risk stratification with coronary calcium score.  She is in agreement 4. I strongly encouraged increasing aerobic activity, decreasing caloric intake, and concentrating on weight loss.    Long conversation concerning primary risk prevention in the setting of strong family history, obesity, untreated hyperlipidemia, hypertension, and sedentary lifestyle.  We discussed each risk factor and potential solutions to decrease risk.  Furthermore we will perform a coronary calcium score to give a better understanding of whether aggressive lipid-lowering is imperative.  Greater than 50% of the time during this office visit was spent in education, counseling, and coordination of care  related to underlying disease process and testing as outlined.    Medication Adjustments/Labs and Tests Ordered: Current medicines are reviewed at length with the patient today.  Concerns regarding medicines are outlined above.  Orders Placed This Encounter  Procedures  . CT CARDIAC SCORING  . EKG 12-Lead   Meds ordered this encounter  Medications  . hydrochlorothiazide (MICROZIDE) 12.5 MG capsule    Sig: Take 1 capsule (12.5 mg total) by mouth every morning.    Dispense:  90 capsule    Refill:  3  . metoprolol succinate (TOPROL-XL) 50 MG 24 hr tablet    Sig: Take 1.5 tablets (75 mg) by mouth daily with or immediately following a meal.    Dispense:  135 tablet    Refill:  3  . potassium chloride SA (K-DUR,KLOR-CON) 20 MEQ tablet    Sig: Take 1 tablet (20 mEq total) by mouth daily.    Dispense:  90 tablet    Refill:  3    Patient Instructions  Medication Instructions:  Your physician recommends that you continue on your current medications as directed. Please refer to the Current Medication list given to you today.  If you need a refill on your cardiac medications before your next appointment, please call your pharmacy.   Lab work: None If you have labs (blood work) drawn today and your tests are completely normal, you will receive your results only by: Marland Kitchen MyChart Message (if you have MyChart) OR . A paper copy in the mail If you have any lab test that is abnormal or we need to change your treatment, we will call you to review the results.  Testing/Procedures: Your physician recommends that you have a Calcium Score completed.  Follow-Up: At [facility], you and your health needs are our priority.  As part of our continuing mission to provide you with exceptional heart care, we have created designated Provider Care Teams.  These Care Teams include your primary Cardiologist (physician) and Advanced Practice Providers (APPs -  Physician Assistants and Nurse Practitioners)  who all work together to provide you with the care you need, when you need it. You will need a follow up  appointment in 12 months.  Please call our office 2 months in advance to schedule this appointment.  You may see Sinclair Grooms, MD or one of the following Advanced Practice Providers on your designated Care Team:   Truitt Merle, NP Cecilie Kicks, NP . Kathyrn Drown, NP  Any Other Special Instructions Will Be Listed Below (If Applicable).       Signed, Sinclair Grooms, MD  04/18/2018 5:30 PM    [place]

## 2018-05-02 ENCOUNTER — Other Ambulatory Visit: Payer: 59

## 2018-05-15 MED FILL — METOPROLOL SUCCINATE ER 50: 50 | 90 days supply | Qty: 135 | Fill #0

## 2018-05-15 MED FILL — HYDROCHLOROTHIAZIDE 12.5 MG: 12.5 | 90 days supply | Qty: 90 | Fill #0

## 2018-05-26 MED FILL — POTASSIUM CHLORIDE CRYS ER: 20 | 90 days supply | Qty: 90 | Fill #0

## 2018-06-12 ENCOUNTER — Other Ambulatory Visit: Payer: Self-pay | Admitting: Interventional Cardiology

## 2018-06-12 MED FILL — CANDESARTAN CILEXETIL 16 MG: 16 | 90 days supply | Qty: 90 | Fill #0

## 2018-08-15 MED FILL — METOPROLOL SUCCINATE ER 50: 50 | 90 days supply | Qty: 135 | Fill #1

## 2018-08-15 MED FILL — HYDROCHLOROTHIAZIDE 12.5 MG: 12.5 | 90 days supply | Qty: 90 | Fill #1

## 2018-09-03 MED FILL — POTASSIUM CHLORIDE CRYS ER: 20 | 90 days supply | Qty: 90 | Fill #1

## 2018-09-04 MED FILL — CANDESARTAN CILEXETIL 16 MG: 16 | 90 days supply | Qty: 90 | Fill #1

## 2018-09-19 DIAGNOSIS — Z0100 Encounter for examination of eyes and vision without abnormal findings: Secondary | ICD-10-CM | POA: Diagnosis not present

## 2018-11-01 ENCOUNTER — Encounter: Payer: Self-pay | Admitting: General Surgery

## 2018-11-01 MED FILL — HYDROCHLOROTHIAZIDE 12.5 MG: 12.5 | 90 days supply | Qty: 90 | Fill #2

## 2018-11-01 MED FILL — METOPROLOL SUCCINATE ER 50: 50 | 90 days supply | Qty: 135 | Fill #2

## 2018-11-02 ENCOUNTER — Ambulatory Visit (INDEPENDENT_AMBULATORY_CARE_PROVIDER_SITE_OTHER): Payer: 59 | Admitting: Internal Medicine

## 2018-11-02 ENCOUNTER — Other Ambulatory Visit: Payer: Self-pay

## 2018-11-02 ENCOUNTER — Encounter: Payer: Self-pay | Admitting: Internal Medicine

## 2018-11-02 VITALS — Ht 65.0 in

## 2018-11-02 DIAGNOSIS — R112 Nausea with vomiting, unspecified: Secondary | ICD-10-CM

## 2018-11-02 DIAGNOSIS — K219 Gastro-esophageal reflux disease without esophagitis: Secondary | ICD-10-CM | POA: Diagnosis not present

## 2018-11-02 DIAGNOSIS — R4702 Dysphasia: Secondary | ICD-10-CM

## 2018-11-02 DIAGNOSIS — R1013 Epigastric pain: Secondary | ICD-10-CM

## 2018-11-02 MED ORDER — ESOMEPRAZOLE MAGNESIUM 40 MG PO CPDR: 40.0000 mg | DELAYED_RELEASE_CAPSULE | Freq: Two times a day (BID) | ORAL | 11 refills | Status: DC

## 2018-11-02 MED FILL — ESOMEPRAZOLE MAG DR 40 MG C: 40 | 30 days supply | Qty: 60 | Fill #0

## 2018-11-02 NOTE — Patient Instructions (Signed)
If you are age 65 or older, your body mass index should be between 23-30. Your Body mass index is 35.61 kg/m. If this is out of the aforementioned range listed, please consider follow up with your Primary Care Provider.  If you are age 90 or younger, your body mass index should be between 19-25. Your Body mass index is 35.61 kg/m. If this is out of the aformentioned range listed, please consider follow up with your Primary Care Provider.   We have sent the following medications to your pharmacy for you to pick up at your convenience: Nexium 40 mg  Twice daily  EGD - to be scheduled in June per patients request.  We will contact the patient when the schedule becomes available.  You have been scheduled for an abdominal ultrasound at [place] on 11/13/18 at 10:15 am. Please arrive 15 minutes prior to your appointment for registration. Make certain not to have anything to eat or drink after midnight the day prior to your appointment. Should you need to reschedule your appointment, please contact radiology at [phone]. This test typically takes about 30 minutes to perform.  We will contact you with results.  Surveillance colonoscopy May 2022.  Thank you for choosing me and [facility].   [name], MD

## 2018-11-02 NOTE — Progress Notes (Signed)
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 female, [employer] employee, who schedules this WebEx telemedicine audiovisual visit with chief complaint of abdominal pain and dysphasia.  Patient was last seen 3 years ago when she underwent surveillance colonoscopy and upper endoscopy.  Colonoscopy revealed diverticulosis.  Follow-up in 5 years due to a history of colon polyps and family history of colon cancer was recommended.  Upper endoscopy revealed mild esophagitis and esophageal stricture.  She was told to resume Nexium 40 mg daily which she has been on since.  She tells me that she was doing well until January 2020 when she began to develop episodic abdominal pain associated with nausea and vomiting as well as chills.  The vomitus typically contains the last meal.  No bleeding.  She has been experiencing such episodes about every 7 or 8 days.  The pain is in the epigastric region with some radiation into the back.  Discomfort is rated between 1 and 5 out of 10.  Typically lasts 1 or 2 days.  Decreased appetite during these episodes.  However, no weight loss.  GI review of systems is otherwise negative.  She does not use significant alcohol.  Her gallbladder is intact  REVIEW OF SYSTEMS:  All non-GI ROS negative unless otherwise stated in the HPI.    Past Medical History:  Diagnosis Date  . ASYMPTOMATIC POSTMENOPAUSAL STATUS   . Blood transfusion without reported diagnosis   . GERD   . HYPERLIPIDEMIA   . HYPERTENSION   . OBESITY   . SCOLIOSIS   . UMBILICAL HERNIORRHAPHY, HX OF    age 77    Past Surgical History:  Procedure Laterality Date  . COLONOSCOPY  2012   Tics  . [place]  . [place]  . UPPER GASTROINTESTINAL ENDOSCOPY    . VAGINAL HYSTERECTOMY  1992    Social History Janice Rodriguez  reports that she is a non-smoker but has been exposed to tobacco smoke. She has never used smokeless tobacco. She reports current  alcohol use. She reports that she does not use drugs.  family history includes Breast cancer in her mother; Colon cancer in her father and paternal aunt; Diabetes in her brother and another family member; Diabetes (age of onset: 91) in her mother; Heart attack in her brother; Heart disease in her father; Hyperlipidemia in an other family member; Hypertension in an other family member; Sarcoidosis in her brother; Stroke in her brother and father.  No Known Allergies     PHYSICAL EXAMINATION: Patient looks well.  Alert and oriented.  Cooperative. No additional physical exam information with telemedicine visit  ASSESSMENT:  1.  67-month history of intermittent epigastric pain with nausea and vomiting.  Possible etiologies include exacerbation of GERD, gastric ulcer, biliary colic, pancreatitis. 2.  GERD.  Classic symptoms controlled with PPI 3.  Intermittent solid food dysphasia.  Known esophageal stricture on prior endoscopy.  Now symptomatic 4.  History of colon polyps and family history of colon cancer.  Surveillance up-to-date  PLAN:  1.  Increase Nexium to 40 mg TWICE daily 2.  SCHEDULE EGD in the [place].The nature of the procedure, as well as the risks, benefits, and alternatives were carefully and thoroughly reviewed with the patient. Ample time for discussion and questions allowed. The patient understood, was satisfied, and agreed to proceed. 3.  SCHEDULE abdominal ultrasound "epigastric pain, vomiting.  Evaluate".  We will contact the patient with the results as soon  as they are available and have been reviewed 4.  Surveillance colonoscopy around May 2022 This WebEx telemedicine audiovisual encounter during the coronavirus pandemic was initiated by the patient and consented for by the patient.  She was in her home and I was in my office during the encounter.  She understands her may be an associated professional charge for this service.

## 2018-11-02 NOTE — H&P (View-Only) (Signed)
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 female, [employer] employee, who schedules this WebEx telemedicine audiovisual visit with chief complaint of abdominal pain and dysphasia.  Patient was last seen 3 years ago when she underwent surveillance colonoscopy and upper endoscopy.  Colonoscopy revealed diverticulosis.  Follow-up in 5 years due to a history of colon polyps and family history of colon cancer was recommended.  Upper endoscopy revealed mild esophagitis and esophageal stricture.  She was told to resume Nexium 40 mg daily which she has been on since.  She tells me that she was doing well until January 2020 when she began to develop episodic abdominal pain associated with nausea and vomiting as well as chills.  The vomitus typically contains the last meal.  No bleeding.  She has been experiencing such episodes about every 7 or 8 days.  The pain is in the epigastric region with some radiation into the back.  Discomfort is rated between 1 and 5 out of 10.  Typically lasts 1 or 2 days.  Decreased appetite during these episodes.  However, no weight loss.  GI review of systems is otherwise negative.  She does not use significant alcohol.  Her gallbladder is intact  REVIEW OF SYSTEMS:  All non-GI ROS negative unless otherwise stated in the HPI.    Past Medical History:  Diagnosis Date  . ASYMPTOMATIC POSTMENOPAUSAL STATUS   . Blood transfusion without reported diagnosis   . GERD   . HYPERLIPIDEMIA   . HYPERTENSION   . OBESITY   . SCOLIOSIS   . UMBILICAL HERNIORRHAPHY, HX OF    age 39    Past Surgical History:  Procedure Laterality Date  . COLONOSCOPY  2012   [place]  . [place]  . [place]  . UPPER GASTROINTESTINAL ENDOSCOPY    . VAGINAL HYSTERECTOMY  1992    Social History Janice Rodriguez  reports that she is a non-smoker but has been exposed to tobacco smoke. She has never used smokeless tobacco. She reports current  alcohol use. She reports that she does not use drugs.  family history includes Breast cancer in her mother; Colon cancer in her father and paternal aunt; Diabetes in her brother and another family member; Diabetes (age of onset: 39) in her mother; Heart attack in her brother; Heart disease in her father; Hyperlipidemia in an other family member; Hypertension in an other family member; Sarcoidosis in her brother; Stroke in her brother and father.  No Known Allergies     PHYSICAL EXAMINATION: Patient looks well.  Alert and oriented.  Cooperative. No additional physical exam information with telemedicine visit  ASSESSMENT:  1.  54-month history of intermittent epigastric pain with nausea and vomiting.  Possible etiologies include exacerbation of GERD, gastric ulcer, biliary colic, pancreatitis. 2.  GERD.  Classic symptoms controlled with PPI 3.  Intermittent solid food dysphasia.  Known esophageal stricture on prior endoscopy.  Now symptomatic 4.  History of colon polyps and family history of colon cancer.  Surveillance up-to-date  PLAN:  1.  Increase Nexium to 40 mg TWICE daily 2.  SCHEDULE EGD in the [place].The nature of the procedure, as well as the risks, benefits, and alternatives were carefully and thoroughly reviewed with the patient. Ample time for discussion and questions allowed. The patient understood, was satisfied, and agreed to proceed. 3.  SCHEDULE abdominal ultrasound "epigastric pain, vomiting.  Evaluate".  We will contact the patient with the results as soon  as they are available and have been reviewed 4.  Surveillance colonoscopy around May 2022 This WebEx telemedicine audiovisual encounter during the coronavirus pandemic was initiated by the patient and consented for by the patient.  She was in her home and I was in my office during the encounter.  She understands her may be an associated professional charge for this service.

## 2018-11-02 NOTE — Addendum Note (Signed)
Addended by: Candie Mile on: 11/02/2018 04:04 PM   Modules accepted: Orders

## 2018-11-10 ENCOUNTER — Telehealth: Payer: Self-pay | Admitting: Internal Medicine

## 2018-11-10 NOTE — Telephone Encounter (Signed)
Pt would like to speak with Peter Congo regarding a procedure that she needs. Pls call her.

## 2018-11-10 NOTE — Telephone Encounter (Signed)
Patient was told to call back for her EGD when she is ready to schedule. Patient states she was calling back to see if any dates in May are available. Went over dates in May and patient states she still wants to discuss it with her husband and see when his surgery is going to be scheduled. Pt will call back to schedule.

## 2018-11-13 ENCOUNTER — Telehealth: Payer: Self-pay

## 2018-11-13 ENCOUNTER — Ambulatory Visit
Admission: RE | Admit: 2018-11-13 | Discharge: 2018-11-13 | Disposition: A | Discharge status: Home / Self Care | Payer: 59 | Source: Ambulatory Visit | Attending: Internal Medicine | Admitting: Internal Medicine

## 2018-11-13 ENCOUNTER — Encounter: Payer: Self-pay | Admitting: Internal Medicine

## 2018-11-13 ENCOUNTER — Other Ambulatory Visit: Payer: Self-pay

## 2018-11-13 ENCOUNTER — Ambulatory Visit: Payer: 59 | Admitting: *Deleted

## 2018-11-13 VITALS — Ht 66.0 in | Wt 195.2 lb

## 2018-11-13 DIAGNOSIS — R1013 Epigastric pain: Secondary | ICD-10-CM

## 2018-11-13 DIAGNOSIS — R4702 Dysphasia: Secondary | ICD-10-CM

## 2018-11-13 DIAGNOSIS — R112 Nausea with vomiting, unspecified: Secondary | ICD-10-CM

## 2018-11-13 DIAGNOSIS — K838 Other specified diseases of biliary tract: Secondary | ICD-10-CM

## 2018-11-13 NOTE — Progress Notes (Addendum)
No egg or soy allergy known to patient  No issues with past sedation with any surgeries  or procedures, no intubation problems  No diet pills per patient No home 02 use per patient  No blood thinners per patient  Pt denies issues with constipation  No A fib or A flutter  EMMI video sent to pt's e mail    PV done over the phone- pt's instructions sent via MyChart as egd is Wednesday 5-20 and will not arrive in the mail in time.  Pt states he has to go to the lab tomorrow morning so I printed her instructions and she will pick them up in the morning- I also sent them via my chart   Pt's states no medical or surgical changes since previsit or office visit.  11-02-2018  Pt came into PV this morning 11-14-2018 for a weight- she was down to 195.2lb - pt states this is unintentional weight loss - Janice Rodriguez

## 2018-11-13 NOTE — Telephone Encounter (Signed)
Covid-19 travel screening questions  Have you traveled in the last 14 days?no If yes where?  Do you now or have you had a fever in the last 14 days?no  Do you have any respiratory symptoms of shortness of breath or cough now or in the last 14 days?no  Do you have a medical history of Congestive Heart Failure?  Do you have a medical history of lung disease?  Do you have any family members or close contacts with diagnosed or suspected Covid-19?no      Pt advised of care partner policy and need to bring a mask, pt agreed.

## 2018-11-14 ENCOUNTER — Other Ambulatory Visit (INDEPENDENT_AMBULATORY_CARE_PROVIDER_SITE_OTHER): Payer: 59

## 2018-11-14 ENCOUNTER — Other Ambulatory Visit (HOSPITAL_COMMUNITY)
Admission: RE | Admit: 2018-11-14 | Discharge: 2018-11-14 | Disposition: A | Discharge status: Home / Self Care | Payer: 59 | Source: Ambulatory Visit | Attending: Internal Medicine | Admitting: Internal Medicine

## 2018-11-14 ENCOUNTER — Other Ambulatory Visit: Payer: Self-pay

## 2018-11-14 ENCOUNTER — Telehealth: Payer: Self-pay

## 2018-11-14 DIAGNOSIS — K838 Other specified diseases of biliary tract: Secondary | ICD-10-CM | POA: Diagnosis not present

## 2018-11-14 DIAGNOSIS — R112 Nausea with vomiting, unspecified: Secondary | ICD-10-CM

## 2018-11-14 DIAGNOSIS — Z1159 Encounter for screening for other viral diseases: Secondary | ICD-10-CM | POA: Diagnosis not present

## 2018-11-14 DIAGNOSIS — R1013 Epigastric pain: Secondary | ICD-10-CM

## 2018-11-14 LAB — CBC WITH DIFFERENTIAL/PLATELET
Basophils Absolute: 0.1 10*3/uL (ref 0.0–0.1)
Basophils Relative: 0.9 % (ref 0.0–3.0)
Eosinophils Absolute: 0.2 10*3/uL (ref 0.0–0.7)
Eosinophils Relative: 1.9 % (ref 0.0–5.0)
HCT: 36.1 % (ref 36.0–46.0)
Hemoglobin: 11.9 g/dL — ABNORMAL LOW (ref 12.0–15.0)
Lymphocytes Relative: 29.1 % (ref 12.0–46.0)
Lymphs Abs: 2.5 10*3/uL (ref 0.7–4.0)
MCHC: 33 g/dL (ref 30.0–36.0)
MCV: 81.9 fl (ref 78.0–100.0)
Monocytes Absolute: 0.6 10*3/uL (ref 0.1–1.0)
Monocytes Relative: 6.9 % (ref 3.0–12.0)
Neutro Abs: 5.2 10*3/uL (ref 1.4–7.7)
Neutrophils Relative %: 61.2 % (ref 43.0–77.0)
Platelets: 273 10*3/uL (ref 150.0–400.0)
RBC: 4.41 Mil/uL (ref 3.87–5.11)
RDW: 17.4 % — ABNORMAL HIGH (ref 11.5–15.5)
WBC: 8.5 10*3/uL (ref 4.0–10.5)

## 2018-11-14 LAB — COMPREHENSIVE METABOLIC PANEL
ALT: 108 U/L — ABNORMAL HIGH (ref 0–35)
AST: 137 U/L — ABNORMAL HIGH (ref 0–37)
Albumin: 4 g/dL (ref 3.5–5.2)
Alkaline Phosphatase: 396 U/L — ABNORMAL HIGH (ref 39–117)
BUN: 8 mg/dL (ref 6–23)
CO2: 25 mEq/L (ref 19–32)
Calcium: 9.2 mg/dL (ref 8.4–10.5)
Chloride: 102 mEq/L (ref 96–112)
Creatinine, Ser: 0.91 mg/dL (ref 0.40–1.20)
GFR: 74.99 mL/min (ref >60.00)
Glucose, Bld: 111 mg/dL — ABNORMAL HIGH (ref 70–99)
Potassium: 3.8 mEq/L (ref 3.5–5.1)
Sodium: 139 mEq/L (ref 135–145)
Total Bilirubin: 2.2 mg/dL — ABNORMAL HIGH (ref 0.2–1.2)
Total Protein: 7.9 g/dL (ref 6.0–8.3)

## 2018-11-14 LAB — LIPASE: Lipase: 11 U/L (ref 11.0–59.0)

## 2018-11-14 MED ORDER — CIPROFLOXACIN HCL 500 MG PO TABS: 500.0000 mg | ORAL_TABLET | Freq: Two times a day (BID) | ORAL | 1 refills | Status: DC

## 2018-11-14 MED FILL — CIPROFLOXACIN HCL 500 MG TA: 500 | 7 days supply | Qty: 14 | Fill #0

## 2018-11-14 NOTE — Telephone Encounter (Signed)
Cipro 500mg  twice a day with instructions to go ahead and take two doses today sent to [facility].

## 2018-11-14 NOTE — Telephone Encounter (Signed)
Patient notified of the results and recommendaitons EGD in [place] cancelled ERCP scheduled for 11/17/18 10:30 at [facility].  Patient will need to arrive at 9:00 am She will come ASAP to get screened for Covid-19.  I spoke with the pre-testing area and they will wait on the patient ( she is on her way). Rx sent  Patient will call me back to review instructions after her testing today

## 2018-11-14 NOTE — Telephone Encounter (Signed)
-----   Message from Irene Shipper, MD sent at 11/14/2018  2:18 PM EDT ----- Regarding: Result note from earlier Notes recorded by Irene Shipper, MD on 11/14/2018 at 12:28 PM EDT  Barbera Setters, I called the patient to review her lab work. She has symptomatic choledocholithiasis and needs the following. She is a Cytogeneticist.  1. Cancel 8 AM EGD in [facility] tomorrow May 20 2. Prescribe ciprofloxacin 500 mg twice daily; #14; 1 refill; start today (2 doses today); [facility] 3. Schedule ERCP with general anesthesia at either [facility] or [facility] this week. I am off on Friday but will come in to do the case. This is an urgent case that she has been having chills, pain, and vomiting. He has a Friday works, great, let me know when. If not, let me know. She will need 1 dose of IV ciprofloxacin prior.   Thank you.   Dr. Henrene Pastor

## 2018-11-14 NOTE — Telephone Encounter (Signed)
Covid-19 travel screening questions  Have you traveled in the last 14 days? If yes where? No Do you now or have you had a fever in the last 14 days? No Do you have any respiratory symptoms of shortness of breath or cough now or in the last 14 days? No Do you have any family members or close contacts with diagnosed or suspected Covid-19? No      

## 2018-11-14 NOTE — Telephone Encounter (Signed)
-----   Message from Irene Shipper, MD sent at 11/14/2018  2:18 PM EDT ----- Regarding: Result note from earlier Notes recorded by Irene Shipper, MD on 11/14/2018 at 12:28 PM EDT  Janice Rodriguez, I called the patient to review her lab work. She has symptomatic choledocholithiasis and needs the following. She is a Cytogeneticist.  1. Cancel 8 AM EGD in [facility] tomorrow May 20 2. Prescribe ciprofloxacin 500 mg twice daily; #14; 1 refill; start today (2 doses today); [facility] 3. Schedule ERCP with general anesthesia at either [facility] or [facility] this week. I am off on Friday but will come in to do the case. This is an urgent case that she has been having chills, pain, and vomiting. He has a Friday works, great, let me know when. If not, let me know. She will need 1 dose of IV ciprofloxacin prior.   Thank you.   Dr. Henrene Pastor

## 2018-11-14 NOTE — Telephone Encounter (Signed)
Patient notified of the results and recommendations. She verbalized understanding of verbal instructions to arrive at 9:30 and be NPO after midnight.  She also verbalized understanding to get in both doses of Cipro today.

## 2018-11-15 ENCOUNTER — Encounter: Payer: 59 | Admitting: Internal Medicine

## 2018-11-15 LAB — NOVEL CORONAVIRUS, NAA (HOSP ORDER, SEND-OUT TO REF LAB; TAT 18-24 HRS): SARS-CoV-2, NAA: NOT DETECTED

## 2018-11-16 ENCOUNTER — Encounter (HOSPITAL_COMMUNITY): Payer: Self-pay

## 2018-11-16 ENCOUNTER — Telehealth: Payer: Self-pay | Admitting: Internal Medicine

## 2018-11-16 NOTE — Telephone Encounter (Signed)
Patient would like to know if she needs to still take any of her medication tomorrow. She has a procedure scheduled for tomorrow at [facility].

## 2018-11-16 NOTE — Progress Notes (Signed)
Spoke with patient who denies and signs or symptoms of the COVID virus. Pt also denies being in close contact with anyone who has been exposed or could have been exposed since her test came back negative. Jobe Igo, RN

## 2018-11-16 NOTE — Telephone Encounter (Signed)
Patient called back and let her know she could take her meds in am 4 hrs. Before her procedure ( patient not on any anti-coags. orDM meds)

## 2018-11-16 NOTE — Anesthesia Preprocedure Evaluation (Addendum)
Anesthesia Evaluation  Patient identified by MRN, date of birth, ID band Patient awake    Reviewed: Allergy & Precautions, NPO status , Patient's Chart, lab work & pertinent test results  Airway Mallampati: II  TM Distance: >3 FB Neck ROM: Full    Dental no notable dental hx. (+) Teeth Intact   Pulmonary neg pulmonary ROS,    Pulmonary exam normal breath sounds clear to auscultation       Cardiovascular hypertension, Pt. on medications Normal cardiovascular exam Rhythm:Regular Rate:Normal     Neuro/Psych negative neurological ROS  negative psych ROS   GI/Hepatic GERD  ,  Endo/Other    Renal/GU      Musculoskeletal   Abdominal   Peds  Hematology   Anesthesia Other Findings   Reproductive/Obstetrics                            Anesthesia Physical Anesthesia Plan  ASA: II  Anesthesia Plan: General   Post-op Pain Management:    Induction: Intravenous, Cricoid pressure planned and Rapid sequence  PONV Risk Score and Plan: Treatment may vary due to age or medical condition, Ondansetron and Dexamethasone  Airway Management Planned: Oral ETT  Additional Equipment:   Intra-op Plan:   Post-operative Plan: Extubation in OR  Informed Consent: I have reviewed the patients History and Physical, chart, labs and discussed the procedure including the risks, benefits and alternatives for the proposed anesthesia with the patient or authorized representative who has indicated his/her understanding and acceptance.     Dental advisory given  Plan Discussed with:   Anesthesia Plan Comments:         Anesthesia Quick Evaluation

## 2018-11-17 ENCOUNTER — Ambulatory Visit (HOSPITAL_COMMUNITY): Payer: 59 | Admitting: Anesthesiology

## 2018-11-17 ENCOUNTER — Ambulatory Visit (HOSPITAL_COMMUNITY): Payer: 59

## 2018-11-17 ENCOUNTER — Encounter (HOSPITAL_COMMUNITY): Payer: Self-pay

## 2018-11-17 ENCOUNTER — Encounter (HOSPITAL_COMMUNITY)
Admission: RE | Disposition: A | Discharge status: Home / Self Care | Payer: Self-pay | Source: Home / Self Care | Attending: Internal Medicine

## 2018-11-17 ENCOUNTER — Other Ambulatory Visit: Payer: Self-pay

## 2018-11-17 ENCOUNTER — Ambulatory Visit (HOSPITAL_COMMUNITY)
Admission: RE | Admit: 2018-11-17 | Discharge: 2018-11-17 | Disposition: A | Discharge status: Home / Self Care | Payer: 59 | Attending: Internal Medicine | Admitting: Internal Medicine

## 2018-11-17 DIAGNOSIS — R4702 Dysphasia: Secondary | ICD-10-CM | POA: Insufficient documentation

## 2018-11-17 DIAGNOSIS — R1011 Right upper quadrant pain: Secondary | ICD-10-CM

## 2018-11-17 DIAGNOSIS — R109 Unspecified abdominal pain: Secondary | ICD-10-CM | POA: Diagnosis present

## 2018-11-17 DIAGNOSIS — I10 Essential (primary) hypertension: Secondary | ICD-10-CM | POA: Diagnosis not present

## 2018-11-17 DIAGNOSIS — K8050 Calculus of bile duct without cholangitis or cholecystitis without obstruction: Secondary | ICD-10-CM | POA: Diagnosis not present

## 2018-11-17 DIAGNOSIS — K838 Other specified diseases of biliary tract: Secondary | ICD-10-CM

## 2018-11-17 DIAGNOSIS — K219 Gastro-esophageal reflux disease without esophagitis: Secondary | ICD-10-CM | POA: Diagnosis not present

## 2018-11-17 DIAGNOSIS — K8051 Calculus of bile duct without cholangitis or cholecystitis with obstruction: Secondary | ICD-10-CM

## 2018-11-17 DIAGNOSIS — K8070 Calculus of gallbladder and bile duct without cholecystitis without obstruction: Secondary | ICD-10-CM | POA: Insufficient documentation

## 2018-11-17 DIAGNOSIS — K828 Other specified diseases of gallbladder: Secondary | ICD-10-CM | POA: Insufficient documentation

## 2018-11-17 DIAGNOSIS — Z86010 Personal history of colonic polyps: Secondary | ICD-10-CM | POA: Diagnosis not present

## 2018-11-17 DIAGNOSIS — R112 Nausea with vomiting, unspecified: Secondary | ICD-10-CM

## 2018-11-17 DIAGNOSIS — R1013 Epigastric pain: Secondary | ICD-10-CM

## 2018-11-17 DIAGNOSIS — E559 Vitamin D deficiency, unspecified: Secondary | ICD-10-CM | POA: Diagnosis not present

## 2018-11-17 DIAGNOSIS — R945 Abnormal results of liver function studies: Secondary | ICD-10-CM | POA: Insufficient documentation

## 2018-11-17 DIAGNOSIS — Z800 Family history of malignant neoplasm of digestive organs: Secondary | ICD-10-CM | POA: Insufficient documentation

## 2018-11-17 DIAGNOSIS — Z7722 Contact with and (suspected) exposure to environmental tobacco smoke (acute) (chronic): Secondary | ICD-10-CM | POA: Diagnosis not present

## 2018-11-17 HISTORY — PX: REMOVAL OF STONES: SHX5545

## 2018-11-17 HISTORY — PX: SPHINCTEROTOMY: SHX5544

## 2018-11-17 HISTORY — DX: Cystocele, unspecified: N81.10

## 2018-11-17 HISTORY — DX: Presence of spectacles and contact lenses: Z97.3

## 2018-11-17 HISTORY — DX: Personal history of other diseases of the circulatory system: Z86.79

## 2018-11-17 HISTORY — PX: ENDOSCOPIC RETROGRADE CHOLANGIOPANCREATOGRAPHY (ERCP) WITH PROPOFOL: SHX5810

## 2018-11-17 HISTORY — DX: Vitamin D deficiency, unspecified: E55.9

## 2018-11-17 SURGERY — ENDOSCOPIC RETROGRADE CHOLANGIOPANCREATOGRAPHY (ERCP) WITH PROPOFOL
Anesthesia: General

## 2018-11-17 MED ORDER — PROPOFOL 10 MG/ML IV BOLUS
INTRAVENOUS | Status: DC | PRN
  Administered 2018-11-17: 30 mg via INTRAVENOUS
  Administered 2018-11-17: 170 mg via INTRAVENOUS

## 2018-11-17 MED ORDER — GLUCAGON HCL RDNA (DIAGNOSTIC) 1 MG IJ SOLR
INTRAMUSCULAR | Status: AC
  Filled 2018-11-17: qty 1

## 2018-11-17 MED ORDER — INDOMETHACIN 50 MG RE SUPP
RECTAL | Status: AC
  Filled 2018-11-17: qty 2

## 2018-11-17 MED ORDER — CIPROFLOXACIN IN D5W 400 MG/200ML IV SOLN
INTRAVENOUS | Status: AC
  Filled 2018-11-17: qty 200

## 2018-11-17 MED ORDER — EPHEDRINE SULFATE 50 MG/ML IJ SOLN
INTRAMUSCULAR | Status: DC | PRN
  Administered 2018-11-17: 5 mg via INTRAVENOUS

## 2018-11-17 MED ORDER — ONDANSETRON HCL 4 MG/2ML IJ SOLN
INTRAMUSCULAR | Status: DC | PRN
  Administered 2018-11-17: 4 mg via INTRAVENOUS

## 2018-11-17 MED ORDER — FENTANYL CITRATE (PF) 100 MCG/2ML IJ SOLN
INTRAMUSCULAR | Status: AC
  Filled 2018-11-17: qty 2

## 2018-11-17 MED ORDER — CIPROFLOXACIN IN D5W 400 MG/200ML IV SOLN
400.0000 mg | Freq: Once | INTRAVENOUS | Status: AC
  Administered 2018-11-17: 400 mg via INTRAVENOUS

## 2018-11-17 MED ORDER — SODIUM CHLORIDE 0.9 % IV SOLN: INTRAVENOUS | Status: DC

## 2018-11-17 MED ORDER — PROPOFOL 10 MG/ML IV BOLUS
INTRAVENOUS | Status: AC
  Filled 2018-11-17: qty 20

## 2018-11-17 MED ORDER — GLUCAGON HCL RDNA (DIAGNOSTIC) 1 MG IJ SOLR
INTRAMUSCULAR | Status: DC | PRN
  Administered 2018-11-17: .5 mg via INTRAVENOUS

## 2018-11-17 MED ORDER — LIDOCAINE HCL (CARDIAC) PF 100 MG/5ML IV SOSY
PREFILLED_SYRINGE | INTRAVENOUS | Status: DC | PRN
  Administered 2018-11-17: 75 mg via INTRAVENOUS
  Administered 2018-11-17: 25 mg via INTRATRACHEAL

## 2018-11-17 MED ORDER — PHENYLEPHRINE HCL (PRESSORS) 10 MG/ML IV SOLN
INTRAVENOUS | Status: DC | PRN
  Administered 2018-11-17 (×2): 80 ug via INTRAVENOUS

## 2018-11-17 MED ORDER — DEXAMETHASONE SODIUM PHOSPHATE 10 MG/ML IJ SOLN
INTRAMUSCULAR | Status: DC | PRN
  Administered 2018-11-17: 5 mg via INTRAVENOUS

## 2018-11-17 MED ORDER — LACTATED RINGERS IV SOLN
INTRAVENOUS | Status: DC
  Administered 2018-11-17 (×2): via INTRAVENOUS

## 2018-11-17 MED ORDER — INDOMETHACIN 50 MG RE SUPP: 100.0000 mg | Freq: Once | RECTAL | Status: DC

## 2018-11-17 MED ORDER — SODIUM CHLORIDE 0.9 % IV SOLN
INTRAVENOUS | Status: DC | PRN
  Administered 2018-11-17: 40 mL

## 2018-11-17 MED ORDER — FENTANYL CITRATE (PF) 100 MCG/2ML IJ SOLN
INTRAMUSCULAR | Status: DC | PRN
  Administered 2018-11-17: 100 ug via INTRAVENOUS

## 2018-11-17 MED ORDER — SUCCINYLCHOLINE CHLORIDE 20 MG/ML IJ SOLN
INTRAMUSCULAR | Status: DC | PRN
  Administered 2018-11-17: 80 mg via INTRAVENOUS

## 2018-11-17 NOTE — Transfer of Care (Signed)
Immediate Anesthesia Transfer of Care Note  Patient: Janice Rodriguez  Procedure(s) Performed: ENDOSCOPIC RETROGRADE CHOLANGIOPANCREATOGRAPHY (ERCP) WITH PROPOFOL (N/A ) SPHINCTEROTOMY REMOVAL OF STONES  Patient Location: PACU  Anesthesia Type:General  Level of Consciousness: drowsy, patient cooperative and responds to stimulation  Airway & Oxygen Therapy: Patient Spontanous Breathing and Patient connected to face mask oxygen  Post-op Assessment: Report given to RN and Post -op Vital signs reviewed and stable  Post vital signs: Reviewed and stable  Last Vitals:  Vitals Value Taken Time  BP 138/78 11/17/2018 12:10 PM  Temp    Pulse 71 11/17/2018 12:12 PM  Resp 14 11/17/2018 12:12 PM  SpO2 100 % 11/17/2018 12:12 PM  Vitals shown include unvalidated device data.  Last Pain:  Vitals:   11/17/18 1209  TempSrc: Oral  PainSc: 0-No pain         Complications: No apparent anesthesia complications

## 2018-11-17 NOTE — Anesthesia Procedure Notes (Signed)
Procedure Name: Intubation Date/Time: 11/17/2018 11:05 AM Performed by: Lissa Morales, CRNA Pre-anesthesia Checklist: Patient identified, Emergency Drugs available, Suction available and Patient being monitored Patient Re-evaluated:Patient Re-evaluated prior to induction Oxygen Delivery Method: Circle system utilized Preoxygenation: Pre-oxygenation with 100% oxygen Induction Type: IV induction Laryngoscope Size: Mac and 4 Grade View: Grade I Tube type: Oral Tube size: 7.0 mm Number of attempts: 1 Airway Equipment and Method: Stylet and Oral airway Placement Confirmation: ETT inserted through vocal cords under direct vision,  positive ETCO2 and breath sounds checked- equal and bilateral Secured at: 21 cm Tube secured with: Tape Dental Injury: Teeth and Oropharynx as per pre-operative assessment

## 2018-11-17 NOTE — Discharge Instructions (Signed)
YOU HAD AN ENDOSCOPIC PROCEDURE TODAY: Refer to the procedure report and other information in the discharge instructions given to you for any specific questions about what was found during the examination. If this information does not answer your questions, please call [place] office at [phone] to clarify.   YOU SHOULD EXPECT: Some feelings of bloating in the abdomen. Passage of more gas than usual. Walking can help get rid of the air that was put into your GI tract during the procedure and reduce the bloating. If you had a lower endoscopy (such as a colonoscopy or flexible sigmoidoscopy) you may notice spotting of blood in your stool or on the toilet paper. Some abdominal soreness may be present for a day or two, also.  DIET: Your first meal following the procedure should be a light meal and then it is ok to progress to your normal diet. A half-sandwich or bowl of soup is an example of a good first meal. Heavy or fried foods are harder to digest and may make you feel nauseous or bloated. Drink plenty of fluids but you should avoid alcoholic beverages for 24 hours. If you had a esophageal dilation, please see attached instructions for diet.    ACTIVITY: Your care partner should take you home directly after the procedure. You should plan to take it easy, moving slowly for the rest of the day. You can resume normal activity the day after the procedure however YOU SHOULD NOT DRIVE, use power tools, machinery or perform tasks that involve climbing or major physical exertion for 24 hours (because of the sedation medicines used during the test).   SYMPTOMS TO REPORT IMMEDIATELY: A gastroenterologist can be reached at any hour. Please call [phone]  for any of the following symptoms:   Following upper endoscopy (EGD, EUS, ERCP, esophageal dilation) Vomiting of blood or coffee ground material  New, significant abdominal pain  New, significant chest pain or pain under the shoulder blades  Painful or  persistently difficult swallowing  New shortness of breath  Black, tarry-looking or red, bloody stools  FOLLOW UP:  If any biopsies were taken you will be contacted by phone or by letter within the next 1-3 weeks. Call [phone]  if you have not heard about the biopsies in 3 weeks.  Please also call with any specific questions about appointments or follow up tests.  

## 2018-11-17 NOTE — Anesthesia Postprocedure Evaluation (Signed)
Anesthesia Post Note  Patient: Janice Rodriguez  Procedure(s) Performed: ENDOSCOPIC RETROGRADE CHOLANGIOPANCREATOGRAPHY (ERCP) WITH PROPOFOL (N/A ) SPHINCTEROTOMY REMOVAL OF STONES     Patient location during evaluation: PACU Anesthesia Type: General Level of consciousness: awake and alert Pain management: pain level controlled Vital Signs Assessment: post-procedure vital signs reviewed and stable Respiratory status: spontaneous breathing, nonlabored ventilation, respiratory function stable and patient connected to nasal cannula oxygen Cardiovascular status: blood pressure returned to baseline and stable Postop Assessment: no apparent nausea or vomiting Anesthetic complications: no    Last Vitals:  Vitals:   11/17/18 1220 11/17/18 1230  BP: (!) 144/75 (!) 151/79  Pulse: 64 (!) 59  Resp: 15 19  Temp:    SpO2: 97% 97%    Last Pain:  Vitals:   11/17/18 1209  TempSrc: Oral  PainSc: 0-No pain                 [name]

## 2018-11-17 NOTE — Interval H&P Note (Signed)
History and Physical Interval Note:  11/17/2018 9:58 AM   INTERVAL WORKUP REVEALED GALLSTONES, DILATED CBD, AND ELEVATED LFT'S. SUSPECT SYMPTOMATIC CHOLEDOCHOLITHIASIS. NOW FOR ERCP.  [name]  has presented today for surgery, with the diagnosis of CBD stone.  The various methods of treatment have been discussed with the patient and family. After consideration of risks, benefits and other options for treatment, the patient has consented to  Procedure(s): ENDOSCOPIC RETROGRADE CHOLANGIOPANCREATOGRAPHY (ERCP) WITH PROPOFOL (N/A) as a surgical intervention.  The patient's history has been reviewed, patient examined, no change in status, stable for surgery.  I have reviewed the patient's chart and labs.  Questions were answered to the patient's satisfaction.     Janice Rodriguez

## 2018-11-17 NOTE — Op Note (Signed)
[facility] Patient Name: Janice Rodriguez Procedure Date: 11/17/2018 MRN: [id number] Attending MD: Docia Chuck. Henrene Pastor , MD Date of Birth: May 15, 1956 CSN: [id number] Age: 63 Admit Type: Outpatient Procedure:                ERCP with biliary sphincterotomy and common bile                            duct stone extraction Indications:              Abdominal pain in the right upper quadrant,                            gallstones and biliary ductal dilation on                            ultrasound, Abnormal liver function test Providers:                Docia Chuck. Henrene Pastor, MD, Cleda Daub, RN, Baird Cancer,                            RN, Cherylynn Ridges, Technician, Rosario Adie,                            CRNA Referring MD:             [name], DO Medicines:                General Anesthesia, See the Anesthesia note for                            documentation of the administered medications Complications:            No immediate complications. Estimated Blood Loss:     Estimated blood loss: none. Procedure:                Pre-Anesthesia Assessment:                           - Prior to the procedure, a History and Physical                            was performed, and patient medications and                            allergies were reviewed. The patient is competent.                            The risks and benefits of the procedure and the                            sedation options and risks were discussed with the                            patient. All questions were answered and informed  consent was obtained. Patient identification and                            proposed procedure were verified by the physician.                            Mental Status Examination: alert and oriented.                            Airway Examination: normal oropharyngeal airway and                            neck mobility. Respiratory Examination: clear to                             auscultation. CV Examination: normal. Prophylactic                            Antibiotics: The patient does not require                            prophylactic antibiotics. Prior Anticoagulants: The                            patient has taken no previous anticoagulant or                            antiplatelet agents. ASA Grade Assessment: II - A                            patient with mild systemic disease. After reviewing                            the risks and benefits, the patient was deemed in                            satisfactory condition to undergo the procedure.                            The anesthesia plan was to use moderate sedation /                            analgesia (conscious sedation). Immediately prior                            to administration of medications, the patient was                            re-assessed for adequacy to receive sedatives. The                            heart rate, respiratory rate, oxygen saturations,  blood pressure, adequacy of pulmonary ventilation,                            and response to care were monitored throughout the                            procedure. The physical status of the patient was                            re-assessed after the procedure.                           Preoperative IV ciprofloxacin was administered.                            After obtaining informed consent, the scope was                            passed under direct vision. Throughout the                            procedure, the patient's blood pressure, pulse, and                            oxygen saturations were monitored continuously. The                            [id number]-Q180V ([id number] Olympus duodenoscope was                            introduced through the mouth, and used to inject                            contrast into and used to inject contrast into the                            bile  duct and ventral pancreatic duct. The ERCP was                            accomplished without difficulty. The patient                            tolerated the procedure well. Scope In: Scope Out: Findings:      The esophagus was successfully intubated under direct vision. The scope       was advanced to a normal major papilla in the descending duodenum       without detailed examination of the pharynx, larynx and associated       structures, and upper GI tract. The upper GI tract was grossly normal.       Initial injection of contrast yielded a normal partial pancreatogram.       The cannula position was adjusted and the the bile duct was deeply       cannulated with a hydrophilic guidewire followed by the traction       (standard)  sphincterotome. Contrast was injected. I personally       interpreted the bile duct images. Opacification of the entire biliary       tree except for the cystic duct and gallbladder was successful. The main       bile duct was diffusely dilated, with several stones measuring up to 12       mm in both the proximal and distal portions of the bile duct. The       largest diameter of the bile duct was 15 mm. A 0.035 inch straight       standard wire was passed into the proximal biliary tree and secured.       Biliary sphincterotomy was made with a traction (standard)       sphincterotome using ERBE electrocautery. The sphincterotomy size was       deemed large but appropriate for the distal bile duct diameter. There       was no post-sphincterotomy bleeding. The biliary tree was swept with an       15?"18 mm balloon starting at the bifurcation. All stones were removed.       No stones remained. The bile duct drained readily. Impression:               1. Choledocholithiasis status post ERCP with                            sphincterotomy and successful bile duct stone                            extraction.                           2. Cholelithiasis Moderate  Sedation:      none Recommendation:           1. Standard post ERCP observation including IV                            fluid and indomethacin rectal suppository                            administration                           2. Discharge home later today if doing well                           3. Initial diet clear liquids to be advanced as                            tolerated                           4. GENERAL SURGICAL REFERRAL for laparoscopic                            cholecystectomy                           5. Discontinue oral ciprofloxacin as you are taking  6. FOLLOW UP LFTS next week.. Procedure Code(s):        --- Professional ---                           [phone], Endoscopic retrograde                            cholangiopancreatography (ERCP); with removal of                            calculi/debris from biliary/pancreatic duct(s)                           43262, Endoscopic retrograde                            cholangiopancreatography (ERCP); with                            sphincterotomy/papillotomy Diagnosis Code(s):        --- Professional ---                           K80.51, Calculus of bile duct without cholangitis                            or cholecystitis with obstruction                           R10.11, Right upper quadrant pain                           R94.5, Abnormal results of liver function studies CPT copyright 2019 American Medical Association. All rights reserved. The codes documented in this report are preliminary and upon coder review may  be revised to meet current compliance requirements. Docia Chuck. Henrene Pastor, MD 11/17/2018 12:11:12 PM This report has been signed electronically. Number of Addenda: 0

## 2018-11-21 ENCOUNTER — Encounter (HOSPITAL_COMMUNITY): Payer: Self-pay | Admitting: Internal Medicine

## 2018-11-25 ENCOUNTER — Other Ambulatory Visit: Payer: Self-pay

## 2018-11-25 ENCOUNTER — Inpatient Hospital Stay (HOSPITAL_COMMUNITY)
Admission: EM | Admit: 2018-11-25 | Discharge: 2018-12-03 | DRG: 445 | Disposition: A | Discharge status: Home Health | Payer: 59 | Attending: Internal Medicine | Admitting: Internal Medicine

## 2018-11-25 ENCOUNTER — Encounter (HOSPITAL_COMMUNITY): Payer: Self-pay | Admitting: Emergency Medicine

## 2018-11-25 ENCOUNTER — Emergency Department (HOSPITAL_COMMUNITY): Payer: 59

## 2018-11-25 DIAGNOSIS — N179 Acute kidney failure, unspecified: Secondary | ICD-10-CM | POA: Diagnosis not present

## 2018-11-25 DIAGNOSIS — E7849 Other hyperlipidemia: Secondary | ICD-10-CM | POA: Diagnosis not present

## 2018-11-25 DIAGNOSIS — B370 Candidal stomatitis: Secondary | ICD-10-CM | POA: Diagnosis not present

## 2018-11-25 DIAGNOSIS — K810 Acute cholecystitis: Secondary | ICD-10-CM | POA: Diagnosis not present

## 2018-11-25 DIAGNOSIS — E876 Hypokalemia: Secondary | ICD-10-CM | POA: Diagnosis present

## 2018-11-25 DIAGNOSIS — Z803 Family history of malignant neoplasm of breast: Secondary | ICD-10-CM | POA: Diagnosis not present

## 2018-11-25 DIAGNOSIS — R7881 Bacteremia: Secondary | ICD-10-CM | POA: Diagnosis present

## 2018-11-25 DIAGNOSIS — R740 Nonspecific elevation of levels of transaminase and lactic acid dehydrogenase [LDH]: Secondary | ICD-10-CM | POA: Diagnosis not present

## 2018-11-25 DIAGNOSIS — E785 Hyperlipidemia, unspecified: Secondary | ICD-10-CM | POA: Diagnosis not present

## 2018-11-25 DIAGNOSIS — K8309 Other cholangitis: Secondary | ICD-10-CM | POA: Diagnosis not present

## 2018-11-25 DIAGNOSIS — D62 Acute posthemorrhagic anemia: Secondary | ICD-10-CM | POA: Diagnosis not present

## 2018-11-25 DIAGNOSIS — R1110 Vomiting, unspecified: Secondary | ICD-10-CM | POA: Diagnosis not present

## 2018-11-25 DIAGNOSIS — R1011 Right upper quadrant pain: Secondary | ICD-10-CM | POA: Diagnosis not present

## 2018-11-25 DIAGNOSIS — K833 Fistula of bile duct: Secondary | ICD-10-CM | POA: Diagnosis not present

## 2018-11-25 DIAGNOSIS — Z1623 Resistance to quinolones and fluoroquinolones: Secondary | ICD-10-CM | POA: Diagnosis present

## 2018-11-25 DIAGNOSIS — D638 Anemia in other chronic diseases classified elsewhere: Secondary | ICD-10-CM | POA: Diagnosis present

## 2018-11-25 DIAGNOSIS — M419 Scoliosis, unspecified: Secondary | ICD-10-CM | POA: Diagnosis present

## 2018-11-25 DIAGNOSIS — Z90710 Acquired absence of both cervix and uterus: Secondary | ICD-10-CM

## 2018-11-25 DIAGNOSIS — Z20828 Contact with and (suspected) exposure to other viral communicable diseases: Secondary | ICD-10-CM | POA: Diagnosis not present

## 2018-11-25 DIAGNOSIS — Z8249 Family history of ischemic heart disease and other diseases of the circulatory system: Secondary | ICD-10-CM

## 2018-11-25 DIAGNOSIS — Z823 Family history of stroke: Secondary | ICD-10-CM | POA: Diagnosis not present

## 2018-11-25 DIAGNOSIS — Z79899 Other long term (current) drug therapy: Secondary | ICD-10-CM

## 2018-11-25 DIAGNOSIS — K8051 Calculus of bile duct without cholangitis or cholecystitis with obstruction: Secondary | ICD-10-CM | POA: Diagnosis not present

## 2018-11-25 DIAGNOSIS — K9189 Other postprocedural complications and disorders of digestive system: Secondary | ICD-10-CM

## 2018-11-25 DIAGNOSIS — K8067 Calculus of gallbladder and bile duct with acute and chronic cholecystitis with obstruction: Secondary | ICD-10-CM | POA: Diagnosis present

## 2018-11-25 DIAGNOSIS — K82A1 Gangrene of gallbladder in cholecystitis: Secondary | ICD-10-CM | POA: Diagnosis present

## 2018-11-25 DIAGNOSIS — K811 Chronic cholecystitis: Secondary | ICD-10-CM | POA: Diagnosis not present

## 2018-11-25 DIAGNOSIS — Z833 Family history of diabetes mellitus: Secondary | ICD-10-CM | POA: Diagnosis not present

## 2018-11-25 DIAGNOSIS — Z9049 Acquired absence of other specified parts of digestive tract: Secondary | ICD-10-CM | POA: Diagnosis not present

## 2018-11-25 DIAGNOSIS — Z800 Family history of malignant neoplasm of digestive organs: Secondary | ICD-10-CM

## 2018-11-25 DIAGNOSIS — E559 Vitamin D deficiency, unspecified: Secondary | ICD-10-CM | POA: Diagnosis not present

## 2018-11-25 DIAGNOSIS — Z7982 Long term (current) use of aspirin: Secondary | ICD-10-CM

## 2018-11-25 DIAGNOSIS — I10 Essential (primary) hypertension: Secondary | ICD-10-CM | POA: Diagnosis not present

## 2018-11-25 DIAGNOSIS — I959 Hypotension, unspecified: Secondary | ICD-10-CM | POA: Diagnosis not present

## 2018-11-25 DIAGNOSIS — Z780 Asymptomatic menopausal state: Secondary | ICD-10-CM

## 2018-11-25 DIAGNOSIS — R509 Fever, unspecified: Secondary | ICD-10-CM | POA: Diagnosis not present

## 2018-11-25 DIAGNOSIS — K838 Other specified diseases of biliary tract: Secondary | ICD-10-CM | POA: Diagnosis not present

## 2018-11-25 DIAGNOSIS — K210 Gastro-esophageal reflux disease with esophagitis: Secondary | ICD-10-CM | POA: Diagnosis present

## 2018-11-25 DIAGNOSIS — Z6831 Body mass index (BMI) 31.0-31.9, adult: Secondary | ICD-10-CM

## 2018-11-25 DIAGNOSIS — B9620 Unspecified Escherichia coli [E. coli] as the cause of diseases classified elsewhere: Secondary | ICD-10-CM | POA: Diagnosis present

## 2018-11-25 DIAGNOSIS — Z7722 Contact with and (suspected) exposure to environmental tobacco smoke (acute) (chronic): Secondary | ICD-10-CM | POA: Diagnosis present

## 2018-11-25 DIAGNOSIS — E860 Dehydration: Secondary | ICD-10-CM | POA: Diagnosis present

## 2018-11-25 DIAGNOSIS — K839 Disease of biliary tract, unspecified: Secondary | ICD-10-CM

## 2018-11-25 DIAGNOSIS — E669 Obesity, unspecified: Secondary | ICD-10-CM | POA: Diagnosis present

## 2018-11-25 LAB — CBC WITH DIFFERENTIAL/PLATELET
Abs Immature Granulocytes: 0.07 10*3/uL (ref 0.00–0.07)
Basophils Absolute: 0 10*3/uL (ref 0.0–0.1)
Basophils Relative: 0 %
Eosinophils Absolute: 0 10*3/uL (ref 0.0–0.5)
Eosinophils Relative: 0 %
HCT: 35.4 % — ABNORMAL LOW (ref 36.0–46.0)
Hemoglobin: 11.5 g/dL — ABNORMAL LOW (ref 12.0–15.0)
Immature Granulocytes: 1 %
Lymphocytes Relative: 9 %
Lymphs Abs: 1.3 10*3/uL (ref 0.7–4.0)
MCH: 26.4 pg (ref 26.0–34.0)
MCHC: 32.5 g/dL (ref 30.0–36.0)
MCV: 81.2 fL (ref 80.0–100.0)
Monocytes Absolute: 0.8 10*3/uL (ref 0.1–1.0)
Monocytes Relative: 5 %
Neutro Abs: 12.4 10*3/uL — ABNORMAL HIGH (ref 1.7–7.7)
Neutrophils Relative %: 85 %
Platelets: 264 10*3/uL (ref 150–400)
RBC: 4.36 MIL/uL (ref 3.87–5.11)
RDW: 16.4 % — ABNORMAL HIGH (ref 11.5–15.5)
WBC: 14.5 10*3/uL — ABNORMAL HIGH (ref 4.0–10.5)
nRBC: 0 % (ref 0.0–0.2)

## 2018-11-25 LAB — COMPREHENSIVE METABOLIC PANEL
ALT: 16 U/L (ref 0–44)
AST: 19 U/L (ref 15–41)
Albumin: 3.1 g/dL — ABNORMAL LOW (ref 3.5–5.0)
Alkaline Phosphatase: 160 U/L — ABNORMAL HIGH (ref 38–126)
Anion gap: 13 (ref 5–15)
BUN: 9 mg/dL (ref 8–23)
CO2: 25 mmol/L (ref 22–32)
Calcium: 9 mg/dL (ref 8.9–10.3)
Chloride: 100 mmol/L (ref 98–111)
Creatinine, Ser: 1.44 mg/dL — ABNORMAL HIGH (ref 0.44–1.00)
GFR calc Af Amer: 44 mL/min — ABNORMAL LOW (ref >60)
GFR calc non Af Amer: 38 mL/min — ABNORMAL LOW (ref >60)
Glucose, Bld: 131 mg/dL — ABNORMAL HIGH (ref 70–99)
Potassium: 3.5 mmol/L (ref 3.5–5.1)
Sodium: 138 mmol/L (ref 135–145)
Total Bilirubin: 1.7 mg/dL — ABNORMAL HIGH (ref 0.3–1.2)
Total Protein: 7.5 g/dL (ref 6.5–8.1)

## 2018-11-25 LAB — LACTIC ACID, PLASMA: Lactic Acid, Venous: 1.2 mmol/L (ref 0.5–1.9)

## 2018-11-25 LAB — LIPASE, BLOOD: Lipase: 21 U/L (ref 11–51)

## 2018-11-25 LAB — SARS CORONAVIRUS 2 BY RT PCR (HOSPITAL ORDER, PERFORMED IN ~~LOC~~ HOSPITAL LAB): SARS Coronavirus 2: NEGATIVE

## 2018-11-25 MED ORDER — SODIUM CHLORIDE 0.9 % IV BOLUS
1000.0000 mL | Freq: Once | INTRAVENOUS | Status: AC
  Administered 2018-11-25: 1000 mL via INTRAVENOUS

## 2018-11-25 MED ORDER — ONDANSETRON HCL 4 MG PO TABS: 4.0000 mg | ORAL_TABLET | Freq: Four times a day (QID) | ORAL | Status: DC | PRN

## 2018-11-25 MED ORDER — SENNOSIDES-DOCUSATE SODIUM 8.6-50 MG PO TABS: 1.0000 | ORAL_TABLET | Freq: Every evening | ORAL | Status: DC | PRN

## 2018-11-25 MED ORDER — IOHEXOL 300 MG/ML  SOLN
100.0000 mL | Freq: Once | INTRAMUSCULAR | Status: AC | PRN
  Administered 2018-11-25: 80 mL via INTRAVENOUS

## 2018-11-25 MED ORDER — ONDANSETRON HCL 4 MG/2ML IJ SOLN: 4.0000 mg | Freq: Four times a day (QID) | INTRAMUSCULAR | Status: DC | PRN

## 2018-11-25 MED ORDER — ACETAMINOPHEN 650 MG RE SUPP: 650.0000 mg | Freq: Four times a day (QID) | RECTAL | Status: DC | PRN

## 2018-11-25 MED ORDER — OXYCODONE HCL 5 MG PO TABS
5.0000 mg | ORAL_TABLET | ORAL | Status: DC | PRN
  Administered 2018-11-26 – 2018-11-28 (×5): 5 mg via ORAL
  Filled 2018-11-25 (×5): qty 1

## 2018-11-25 MED ORDER — PIPERACILLIN-TAZOBACTAM 3.375 G IVPB 30 MIN
3.3750 g | Freq: Once | INTRAVENOUS | Status: AC
  Administered 2018-11-25: 3.375 g via INTRAVENOUS
  Filled 2018-11-25: qty 50

## 2018-11-25 MED ORDER — PANTOPRAZOLE SODIUM 40 MG PO TBEC
40.0000 mg | DELAYED_RELEASE_TABLET | Freq: Every day | ORAL | Status: DC
  Administered 2018-11-25 – 2018-12-03 (×8): 40 mg via ORAL
  Filled 2018-11-25 (×8): qty 1

## 2018-11-25 MED ORDER — SODIUM CHLORIDE 0.9 % IV BOLUS
1000.0000 mL | Freq: Once | INTRAVENOUS | Status: AC
  Administered 2018-11-25: 16:00:00 1000 mL via INTRAVENOUS

## 2018-11-25 MED ORDER — ACETAMINOPHEN 325 MG PO TABS
650.0000 mg | ORAL_TABLET | Freq: Four times a day (QID) | ORAL | Status: DC | PRN
  Administered 2018-11-25 – 2018-12-03 (×7): 650 mg via ORAL
  Filled 2018-11-25 (×8): qty 2

## 2018-11-25 MED ORDER — SODIUM CHLORIDE 0.9 % IV SOLN
INTRAVENOUS | Status: DC
  Administered 2018-11-25: 23:00:00 via INTRAVENOUS

## 2018-11-25 MED ORDER — PIPERACILLIN-TAZOBACTAM 3.375 G IVPB
3.3750 g | Freq: Three times a day (TID) | INTRAVENOUS | Status: DC
  Administered 2018-11-25 – 2018-11-27 (×4): 3.375 g via INTRAVENOUS
  Filled 2018-11-25 (×4): qty 50

## 2018-11-25 MED ORDER — METOPROLOL SUCCINATE ER 25 MG PO TB24
75.0000 mg | ORAL_TABLET | Freq: Every day | ORAL | Status: DC
  Administered 2018-11-26 – 2018-12-03 (×8): 75 mg via ORAL
  Filled 2018-11-25 (×8): qty 3

## 2018-11-25 NOTE — ED Triage Notes (Signed)
Pt. Stated, Janice Rodriguez had stomach pain and its probably my gall bladder.  Janice Rodriguez been hurting since May 22. This morning I vomited up bile.

## 2018-11-25 NOTE — ED Notes (Signed)
ED TO INPATIENT HANDOFF REPORT  ED Nurse Name and Phone #: Francisco Eyerly [phone]  S Name/Age/Gender Janice Rodriguez 63 y.o. female Room/Bed: 037C/037C  Code Status   Code Status: Not on file  Home/SNF/Other Home Patient oriented to: self, place, time and situation Is this baseline? Yes   Triage Complete: Triage complete  Chief Complaint Gall Bladder pain/fever  Triage Note Pt. Stated, Janice Rodriguez had stomach pain and its probably my gall bladder.  Janice Rodriguez been hurting since May 22. This morning I vomited up bile.   Allergies No Known Allergies  Level of Care/Admitting Diagnosis ED Disposition    ED Disposition Condition Comment   Admit  Hospital Area: [place] [[id number]]  Level of Care: Med-Surg [16]  Covid Evaluation: Confirmed COVID Negative  Diagnosis: Cholangitis [576.1.ICD-9-CM]  Admitting Physician: Lenore Cordia [[id number]]  Attending Physician: Lenore Cordia [[id number]]  Estimated length of stay: past midnight tomorrow  Certification:: I certify this patient will need inpatient services for at least 2 midnights  PT Class (Do Not Modify): Inpatient [101]  PT Acc Code (Do Not Modify): Private [1]       B Medical/Surgery History Past Medical History:  Diagnosis Date  . ASYMPTOMATIC POSTMENOPAUSAL STATUS   . Blood transfusion without reported diagnosis 1980's  . Female cystocele   . Gall stones, common bile duct    dialated bile duct per [place] called today 11-13-2018  . GERD   . History of irregular heartbeat   . HYPERLIPIDEMIA   . HYPERTENSION   . Left atrial enlargement 12/2013   Noted on ECHO  . LVH (left ventricular hypertrophy) 12/2013   Mild, noted on ECHO  . OBESITY   . SCOLIOSIS   . 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 UMBILICAL HERNIORRHAPHY, HX OF    age 75  . Vitamin D deficiency   . Wears glasses    Past Surgical History:  Procedure  Laterality Date  . COLONOSCOPY  2012   [place]  . COLONOSCOPY  10/2015  . [place]  . ENDOSCOPIC RETROGRADE CHOLANGIOPANCREATOGRAPHY (ERCP) WITH PROPOFOL N/A 11/17/2018   Procedure: ENDOSCOPIC RETROGRADE CHOLANGIOPANCREATOGRAPHY (ERCP) WITH PROPOFOL;  Surgeon: Irene Shipper, MD;  Location: [facility] ENDOSCOPY;  Service: Endoscopy;  Laterality: N/A;  . REMOVAL OF STONES  11/17/2018   Procedure: REMOVAL OF STONES;  Surgeon: Irene Shipper, MD;  Location: [facility] ENDOSCOPY;  Service: Endoscopy;;  . [name]  11/17/2018   Procedure: SPHINCTEROTOMY;  Surgeon: Irene Shipper, MD;  Location: [facility] ENDOSCOPY;  Service: Endoscopy;;  . [place]  . UPPER GASTROINTESTINAL ENDOSCOPY  10/2015  . VAGINAL HYSTERECTOMY  1992     A IV Location/Drains/Wounds Patient Lines/Drains/Airways Status   Active Line/Drains/Airways    Name:   Placement date:   Placement time:   Site:   Days:   Peripheral IV 11/25/18 Right Antecubital   11/25/18    1530    Antecubital   less than 1          Intake/Output Last 24 hours  Intake/Output Summary (Last 24 hours) at 11/25/2018 2024 Last data filed at 11/25/2018 1755 Gross per 24 hour  Intake 1050 ml  Output -  Net 1050 ml    Labs/Imaging Results for orders placed or performed during the hospital encounter of 11/25/18 (from the past 48 hour(s))  CBC with Differential     Status: Abnormal  Collection Time: 11/25/18  3:30 PM  Result Value Ref Range   WBC 14.5 (H) 4.0 - 10.5 K/uL   RBC 4.36 3.87 - 5.11 MIL/uL   Hemoglobin 11.5 (L) 12.0 - 15.0 g/dL   HCT 35.4 (L) 36.0 - 46.0 %   MCV 81.2 80.0 - 100.0 fL   MCH 26.4 26.0 - 34.0 pg   MCHC 32.5 30.0 - 36.0 g/dL   RDW 16.4 (H) 11.5 - 15.5 %   Platelets 264 150 - 400 K/uL   nRBC 0.0 0.0 - 0.2 %   Neutrophils Relative % 85 %   Neutro Abs 12.4 (H) 1.7 - 7.7 K/uL   Lymphocytes Relative 9 %   Lymphs Abs 1.3 0.7 - 4.0 K/uL   Monocytes Relative 5 %   Monocytes Absolute 0.8 0.1 - 1.0 K/uL    Eosinophils Relative 0 %   Eosinophils Absolute 0.0 0.0 - 0.5 K/uL   Basophils Relative 0 %   Basophils Absolute 0.0 0.0 - 0.1 K/uL   Immature Granulocytes 1 %   Abs Immature Granulocytes 0.07 0.00 - 0.07 K/uL    Comment: Performed at [facility], [address], [place], [place] [address]  Lipase, blood     Status: None   Collection Time: 11/25/18  3:30 PM  Result Value Ref Range   Lipase 21 11 - 51 U/L    Comment: Performed at [facility] [address] [place], [place] [address]  Comprehensive metabolic panel     Status: Abnormal   Collection Time: 11/25/18  3:30 PM  Result Value Ref Range   Sodium 138 135 - 145 mmol/L   Potassium 3.5 3.5 - 5.1 mmol/L   Chloride 100 98 - 111 mmol/L   CO2 25 22 - 32 mmol/L   Glucose, Bld 131 (H) 70 - 99 mg/dL   BUN 9 8 - 23 mg/dL   Creatinine, Ser 1.44 (H) 0.44 - 1.00 mg/dL   Calcium 9.0 8.9 - 10.3 mg/dL   Total Protein 7.5 6.5 - 8.1 g/dL   Albumin 3.1 (L) 3.5 - 5.0 g/dL   AST 19 15 - 41 U/L   ALT 16 0 - 44 U/L   Alkaline Phosphatase 160 (H) 38 - 126 U/L   Total Bilirubin 1.7 (H) 0.3 - 1.2 mg/dL   GFR calc non Af Amer 38 (L) >60 mL/min   GFR calc Af Amer 44 (L) >60 mL/min   Anion gap 13 5 - 15    Comment: Performed at [facility] [address] [place], [place] [address]  Lactic acid, plasma     Status: None   Collection Time: 11/25/18  3:30 PM  Result Value Ref Range   Lactic Acid, Venous 1.2 0.5 - 1.9 mmol/L    Comment: Performed at [facility] [address] [place], [place] [address]  SARS Coronavirus 2 (CEPHEID - Performed in [facility] hospital lab), Hosp Order     Status: None   Collection Time: 11/25/18  4:50 PM  Result Value Ref Range   SARS Coronavirus 2 NEGATIVE NEGATIVE    Comment: (NOTE) If result is NEGATIVE SARS-CoV-2 target nucleic acids are NOT DETECTED. The SARS-CoV-2 RNA is generally detectable in upper and lower  respiratory specimens during the acute phase of infection. The  lowest  concentration of SARS-CoV-2 viral copies this assay can detect is 250  copies / mL. A negative result does not preclude SARS-CoV-2 infection  and should not  be used as the sole basis for treatment or other  patient management decisions.  A negative result may occur with  improper specimen collection / handling, submission of specimen other  than nasopharyngeal swab, presence of viral mutation(s) within the  areas targeted by this assay, and inadequate number of viral copies  (<250 copies / mL). A negative result must be combined with clinical  observations, patient history, and epidemiological information. If result is POSITIVE SARS-CoV-2 target nucleic acids are DETECTED. The SARS-CoV-2 RNA is generally detectable in upper and lower  respiratory specimens dur ing the acute phase of infection.  Positive  results are indicative of active infection with SARS-CoV-2.  Clinical  correlation with patient history and other diagnostic information is  necessary to determine patient infection status.  Positive results do  not rule out bacterial infection or co-infection with other viruses. If result is PRESUMPTIVE POSTIVE SARS-CoV-2 nucleic acids MAY BE PRESENT.   A presumptive positive result was obtained on the submitted specimen  and confirmed on repeat testing.  While 2019 novel coronavirus  (SARS-CoV-2) nucleic acids may be present in the submitted sample  additional confirmatory testing may be necessary for epidemiological  and / or clinical management purposes  to differentiate between  SARS-CoV-2 and other Sarbecovirus currently known to infect humans.  If clinically indicated additional testing with an alternate test  methodology [phone]) is advised. The SARS-CoV-2 RNA is generally  detectable in upper and lower respiratory sp ecimens during the acute  phase of infection. The expected result is Negative. Fact Sheet for Patients:   StrictlyIdeas.no Fact Sheet for Healthcare Providers: BankingDealers.co.za This test is not yet approved or cleared by the [place] FDA and has been authorized for detection and/or diagnosis of SARS-CoV-2 by FDA under an Emergency Use Authorization (EUA).  This EUA will remain in effect (meaning this test can be used) for the duration of the COVID-19 declaration under Section 564(b)(1) of the Act, 21 U.S.C. section 360bbb-3(b)(1), unless the authorization is terminated or revoked sooner. Performed at [facility], [place] [address], [place], [place] [address]    Ct Abdomen Pelvis W Contrast  Result Date: 11/25/2018 CLINICAL DATA:  Right upper quadrant pain. ERCP Nov 17, 2018. Nausea for several days. Fever last night. Vomiting this morning. EXAM: CT ABDOMEN AND PELVIS WITH CONTRAST TECHNIQUE: Multidetector CT imaging of the abdomen and pelvis was performed using the standard protocol following bolus administration of intravenous contrast. CONTRAST:  47mL OMNIPAQUE IOHEXOL 300 MG/ML  SOLN COMPARISON:  Ultrasound Nov 13, 2018 FINDINGS: Lower chest: There is a small hiatal hernia. The lung bases and lower chest are otherwise normal. Hepatobiliary: No liver masses are identified. The gallbladder is somewhat thick walled. There is fat stranding adjacent to the gallbladder. There is also a small amount of fluid in the right upper quadrant, extending inferior to the gallbladder. There is air in the bile ducts consistent with recent ERCP and sphincterotomy. Intra and extrahepatic biliary duct prominence remains. Portal vein is patent. Pancreas: The fat stranding in fluid in the right upper quadrant abuts the pancreatic head. However, the pancreas itself is grossly unremarkable. Spleen: Normal in size without focal abnormality. Adrenals/Urinary Tract: Adrenal glands are normal. The kidneys are unremarkable. No hydronephrosis or perinephric stranding. The  ureters are normal. The bladder is unremarkable. Stomach/Bowel: There is a small hiatal hernia. The stomach is otherwise normal. The duodenum is unremarkable. The remainder of the small bowel is normal. Colonic diverticulosis is seen without diverticulitis. Visualized appendix  is normal. Vascular/Lymphatic: Atherosclerotic changes are seen in the tortuous nonaneurysmal aorta. No adenopathy. Reproductive: Status post hysterectomy. No adnexal masses. Other: No abdominal wall hernia or abnormality. No abdominopelvic ascites. Musculoskeletal: Scoliotic curvature lumbar spine, apex to the right. Degenerative changes in the thoracolumbar spine including the facets. IMPRESSION: 1. There is mild gallbladder wall thickening. There is fat stranding and fluid in the right upper quadrant abutting the duodenum, gallbladder, and pancreatic head. Findings are not specific but concerning for acute cholecystitis or cholangitis. The patient has a normal lipase today making pancreatitis unlikely. The duodenum demonstrates no definitive wall thickening making duodenitis less likely. Electronically Signed   By: Dorise Bullion III M.D   On: 11/25/2018 18:18    Pending Labs Unresulted Labs (From admission, onward)    Start     Ordered   11/25/18 1505  Urinalysis, Routine w reflex microscopic  ONCE - STAT,   STAT     11/25/18 1504   11/25/18 1501  Culture, blood (routine x 2)  BLOOD CULTURE X 2,   STAT     11/25/18 1502          Vitals/Pain Today's Vitals   11/25/18 1700 11/25/18 1715 11/25/18 1730 11/25/18 1909  BP: 134/72 137/72 135/70 135/71  Pulse: 74 72 74 76  Resp: 17 19 20 20   Temp:      TempSrc:      SpO2: 98% 99% 99% 100%  PainSc:        Isolation Precautions No active isolations  Medications Medications  sodium chloride 0.9 % bolus 1,000 mL (0 mLs Intravenous Stopped 11/25/18 1755)  piperacillin-tazobactam (ZOSYN) IVPB 3.375 g (0 g Intravenous Stopped 11/25/18 1635)  sodium chloride 0.9 % bolus  1,000 mL (1,000 mLs Intravenous New Bag/Given 11/25/18 1732)  iohexol (OMNIPAQUE) 300 MG/ML solution 100 mL (80 mLs Intravenous Contrast Given 11/25/18 1741)    Mobility walks Low fall risk   Focused Assessments    R Recommendations: See Admitting Provider Note  Report given to:   Additional Notes:

## 2018-11-25 NOTE — Progress Notes (Signed)
Pharmacy Antibiotic Note  Janice Rodriguez is a 63 y.o. female admitted on 11/25/2018 with abdominal pain.  Pharmacy has been consulted for zosyn dosing. Pt is afebrile but WBC is elevated. Scr is also elevated at 1.44. Lactic acid is normal. CT shows possible cholecystitis vs cholangitis.   Plan: Zosyn 3.375gm IV Q8H (4 hr inf0 F/u renal fxn, C&S, clinical status *Pharmacy will sign off as no dose adjustments are anticipated.      Temp (24hrs), Avg:98.6 F (37 C), Min:98.6 F (37 C), Max:98.6 F (37 C)  Recent Labs  Lab 11/25/18 1530  WBC 14.5*  CREATININE 1.44*  LATICACIDVEN 1.2    Estimated Creatinine Clearance: 43.7 mL/min (A) (by C-G formula based on SCr of 1.44 mg/dL (H)).    No Known Allergies  Thank you for allowing pharmacy to be a part of this patient's care.  Brinley Treanor, Rande Lawman 11/25/2018 8:44 PM

## 2018-11-25 NOTE — H&P (View-Only) (Signed)
Reason for Consult: gallstones Referring Physician: Pickering  Janice Rodriguez is an 63 y.o. female.  HPI:  Patient is a 63-year-old female who has been having intermittent abdominal pain going to her back for around 3 to 4 weeks.  It started to become more severe and she called Dr. Perry who is her GI doctor.  He worked her up and she was seen to have a dilated common bile duct on ultrasound as well as gallstones.  She underwent ERCP last week and had numerous gallstones removed from her common bile duct as well as a sphincterotomy performed.  She states that she never felt entirely well but did improve.  However the pain continued to be severe.  She also started having shaking chills again today and was told to come to the emergency department.  Upon her evaluation here, her bilirubin is 1.7 which is down from previous.  Her alk phos is still elevated but her transaminases have returned to normal.  Lipase is normal.  White count is elevated.  CT was performed showing mild gallbladder wall thickening.  When asked, she also states that she has been losing weight.    She has no knowledge of gallbladder disease in her family.  Of note, she has had an umbilical hernia repair when she was 6.   Past Medical History:  Diagnosis Date  . ASYMPTOMATIC POSTMENOPAUSAL STATUS   . Blood transfusion without reported diagnosis 1980's  . Female cystocele   . Gall stones, common bile duct    dialated bile duct per US called today 11-13-2018  . GERD   . History of irregular heartbeat   . HYPERLIPIDEMIA   . HYPERTENSION   . Left atrial enlargement 12/2013   Noted on ECHO  . LVH (left ventricular hypertrophy) 12/2013   Mild, noted on ECHO  . OBESITY   . SCOLIOSIS   . 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 UMBILICAL HERNIORRHAPHY, HX OF    age 6  . Vitamin D deficiency   . Wears glasses     Past Surgical  History:  Procedure Laterality Date  . COLONOSCOPY  2012   Tics  . COLONOSCOPY  10/2015  . ECTOPIC PREGNANCY SURGERY  1983  . ENDOSCOPIC RETROGRADE CHOLANGIOPANCREATOGRAPHY (ERCP) WITH PROPOFOL N/A 11/17/2018   Procedure: ENDOSCOPIC RETROGRADE CHOLANGIOPANCREATOGRAPHY (ERCP) WITH PROPOFOL;  Surgeon: Perry, John N, MD;  Location: [facility];  Service: Endoscopy;  Laterality: N/A;  . REMOVAL OF STONES  11/17/2018   Procedure: REMOVAL OF STONES;  Surgeon: Perry, John N, MD;  Location: [facility];  Service: Endoscopy;;  . SPHINCTEROTOMY  11/17/2018   Procedure: SPHINCTEROTOMY;  Surgeon: Perry, John N, MD;  Location: [facility];  Service: Endoscopy;;  . UMBILICAL HERNIA REPAIR  1963  . UPPER GASTROINTESTINAL ENDOSCOPY  10/2015  . VAGINAL HYSTERECTOMY  1992    Family History  Problem Relation Age of Onset  . Breast cancer Mother   . Diabetes Mother 55  . Stroke Father   . Heart disease Father   . Colon cancer Father        not sure of age dx  . Stroke Brother   . Diabetes Brother   . Sarcoidosis Brother   . Colon cancer Paternal Aunt   . Diabetes Other   . Hypertension Other   . Hyperlipidemia Other   . Heart attack Brother   . Esophageal cancer Neg Hx   . Pancreatic   cancer Neg Hx   . Rectal cancer Neg Hx   . Stomach cancer Neg Hx   . Colon polyps Neg Hx     Social History:  reports that she is a non-smoker but has been exposed to tobacco smoke. She has never used smokeless tobacco. She reports current alcohol use. She reports that she does not use drugs.  Allergies: No Known Allergies  Medications: I have reviewed the patient's current medications.  Results for orders placed or performed during the hospital encounter of 11/25/18 (from the past 48 hour(s))  CBC with Differential     Status: Abnormal   Collection Time: 11/25/18  3:30 PM  Result Value Ref Range   WBC 14.5 (H) 4.0 - 10.5 K/uL   RBC 4.36 3.87 - 5.11 MIL/uL   Hemoglobin 11.5 (L) 12.0 - 15.0 g/dL   HCT  35.4 (L) 36.0 - 46.0 %   MCV 81.2 80.0 - 100.0 fL   MCH 26.4 26.0 - 34.0 pg   MCHC 32.5 30.0 - 36.0 g/dL   RDW 16.4 (H) 11.5 - 15.5 %   Platelets 264 150 - 400 K/uL   nRBC 0.0 0.0 - 0.2 %   Neutrophils Relative % 85 %   Neutro Abs 12.4 (H) 1.7 - 7.7 K/uL   Lymphocytes Relative 9 %   Lymphs Abs 1.3 0.7 - 4.0 K/uL   Monocytes Relative 5 %   Monocytes Absolute 0.8 0.1 - 1.0 K/uL   Eosinophils Relative 0 %   Eosinophils Absolute 0.0 0.0 - 0.5 K/uL   Basophils Relative 0 %   Basophils Absolute 0.0 0.0 - 0.1 K/uL   Immature Granulocytes 1 %   Abs Immature Granulocytes 0.07 0.00 - 0.07 K/uL    Comment: Performed at [facility], [address] [place], [place] [address]  Lipase, blood     Status: None   Collection Time: 11/25/18  3:30 PM  Result Value Ref Range   Lipase 21 11 - 51 U/L    Comment: Performed at [facility], [address] [place], [place] [address]  Comprehensive metabolic panel     Status: Abnormal   Collection Time: 11/25/18  3:30 PM  Result Value Ref Range   Sodium 138 135 - 145 mmol/L   Potassium 3.5 3.5 - 5.1 mmol/L   Chloride 100 98 - 111 mmol/L   CO2 25 22 - 32 mmol/L   Glucose, Bld 131 (H) 70 - 99 mg/dL   BUN 9 8 - 23 mg/dL   Creatinine, Ser 1.44 (H) 0.44 - 1.00 mg/dL   Calcium 9.0 8.9 - 10.3 mg/dL   Total Protein 7.5 6.5 - 8.1 g/dL   Albumin 3.1 (L) 3.5 - 5.0 g/dL   AST 19 15 - 41 U/L   ALT 16 0 - 44 U/L   Alkaline Phosphatase 160 (H) 38 - 126 U/L   Total Bilirubin 1.7 (H) 0.3 - 1.2 mg/dL   GFR calc non Af Amer 38 (L) >60 mL/min   GFR calc Af Amer 44 (L) >60 mL/min   Anion gap 13 5 - 15    Comment: Performed at [facility], [address] [place], [place] [address]  Lactic acid, plasma     Status: None   Collection Time: 11/25/18  3:30 PM  Result Value Ref Range   Lactic Acid, Venous 1.2 0.5 - 1.9 mmol/L    Comment: Performed at [facility], [address] [place], [place]   [id number]  SARS Coronavirus 2 (CEPHEID -  Performed in [facility] hospital lab), Hosp Order     Status: None   Collection Time: 11/25/18  4:50 PM  Result Value Ref Range   SARS Coronavirus 2 NEGATIVE NEGATIVE    Comment: (NOTE) If result is NEGATIVE SARS-CoV-2 target nucleic acids are NOT DETECTED. The SARS-CoV-2 RNA is generally detectable in upper and lower  respiratory specimens during the acute phase of infection. The lowest  concentration of SARS-CoV-2 viral copies this assay can detect is 250  copies / mL. A negative result does not preclude SARS-CoV-2 infection  and should not be used as the sole basis for treatment or other  patient management decisions.  A negative result may occur with  improper specimen collection / handling, submission of specimen other  than nasopharyngeal swab, presence of viral mutation(s) within the  areas targeted by this assay, and inadequate number of viral copies  (<250 copies / mL). A negative result must be combined with clinical  observations, patient history, and epidemiological information. If result is POSITIVE SARS-CoV-2 target nucleic acids are DETECTED. The SARS-CoV-2 RNA is generally detectable in upper and lower  respiratory specimens dur ing the acute phase of infection.  Positive  results are indicative of active infection with SARS-CoV-2.  Clinical  correlation with patient history and other diagnostic information is  necessary to determine patient infection status.  Positive results do  not rule out bacterial infection or co-infection with other viruses. If result is PRESUMPTIVE POSTIVE SARS-CoV-2 nucleic acids MAY BE PRESENT.   A presumptive positive result was obtained on the submitted specimen  and confirmed on repeat testing.  While 2019 novel coronavirus  (SARS-CoV-2) nucleic acids may be present in the submitted sample  additional confirmatory testing may be necessary for epidemiological  and / or clinical management purposes  to differentiate between  SARS-CoV-2  and other Sarbecovirus currently known to infect humans.  If clinically indicated additional testing with an alternate test  methodology [phone]) is advised. The SARS-CoV-2 RNA is generally  detectable in upper and lower respiratory sp ecimens during the acute  phase of infection. The expected result is Negative. Fact Sheet for Patients:  StrictlyIdeas.no Fact Sheet for Healthcare Providers: BankingDealers.co.za This test is not yet approved or cleared by the [place] FDA and has been authorized for detection and/or diagnosis of SARS-CoV-2 by FDA under an Emergency Use Authorization (EUA).  This EUA will remain in effect (meaning this test can be used) for the duration of the COVID-19 declaration under Section 564(b)(1) of the Act, 21 U.S.C. section 360bbb-3(b)(1), unless the authorization is terminated or revoked sooner. Performed at [facility], [place] [address] [place], [place] [address]     Ct Abdomen Pelvis W Contrast  Result Date: 11/25/2018 CLINICAL DATA:  Right upper quadrant pain. ERCP Nov 17, 2018. Nausea for several days. Fever last night. Vomiting this morning. EXAM: CT ABDOMEN AND PELVIS WITH CONTRAST TECHNIQUE: Multidetector CT imaging of the abdomen and pelvis was performed using the standard protocol following bolus administration of intravenous contrast. CONTRAST:  38m OMNIPAQUE IOHEXOL 300 MG/ML  SOLN COMPARISON:  Ultrasound Nov 13, 2018 FINDINGS: Lower chest: There is a small hiatal hernia. The lung bases and lower chest are otherwise normal. Hepatobiliary: No liver masses are identified. The gallbladder is somewhat thick walled. There is fat stranding adjacent to the gallbladder. There is also a small amount of fluid in the right upper quadrant, extending inferior to the gallbladder. There  is air in the bile ducts consistent with recent ERCP and sphincterotomy. Intra and extrahepatic biliary duct prominence  remains. Portal vein is patent. Pancreas: The fat stranding in fluid in the right upper quadrant abuts the pancreatic head. However, the pancreas itself is grossly unremarkable. Spleen: Normal in size without focal abnormality. Adrenals/Urinary Tract: Adrenal glands are normal. The kidneys are unremarkable. No hydronephrosis or perinephric stranding. The ureters are normal. The bladder is unremarkable. Stomach/Bowel: There is a small hiatal hernia. The stomach is otherwise normal. The duodenum is unremarkable. The remainder of the small bowel is normal. Colonic diverticulosis is seen without diverticulitis. Visualized appendix is normal. Vascular/Lymphatic: Atherosclerotic changes are seen in the tortuous nonaneurysmal aorta. No adenopathy. Reproductive: Status post hysterectomy. No adnexal masses. Other: No abdominal wall hernia or abnormality. No abdominopelvic ascites. Musculoskeletal: Scoliotic curvature lumbar spine, apex to the right. Degenerative changes in the thoracolumbar spine including the facets. IMPRESSION: 1. There is mild gallbladder wall thickening. There is fat stranding and fluid in the right upper quadrant abutting the duodenum, gallbladder, and pancreatic head. Findings are not specific but concerning for acute cholecystitis or cholangitis. The patient has a normal lipase today making pancreatitis unlikely. The duodenum demonstrates no definitive wall thickening making duodenitis less likely. Electronically Signed   By: Dorise Bullion III M.D   On: 11/25/2018 18:18    Review of Systems  Constitutional: Positive for chills and fever.  HENT: Negative.   Respiratory: Negative.   Cardiovascular: Negative.   Gastrointestinal: Positive for abdominal pain and nausea.  Genitourinary: Negative.   Musculoskeletal: Negative.   Skin: Negative.   Neurological: Negative.   Endo/Heme/Allergies: Negative.   Psychiatric/Behavioral: Negative.    Blood pressure 135/71, pulse 76, temperature 98.6  F (37 C), temperature source Oral, resp. rate 20, SpO2 100 %. Physical Exam  Constitutional: She is oriented to person, place, and time. She appears well-developed and well-nourished. She appears distressed (looks uncomfortable).  HENT:  Head: Normocephalic and atraumatic.  Eyes: Pupils are equal, round, and reactive to light. Conjunctivae are normal. Right eye exhibits no discharge. Left eye exhibits no discharge. No scleral icterus.  Neck: Neck supple. No tracheal deviation present.  Cardiovascular: Normal rate, regular rhythm and intact distal pulses.  Respiratory: Effort normal. No respiratory distress. She exhibits no tenderness.  GI: Soft. She exhibits no distension. There is abdominal tenderness (mild tenderness RUQ). There is no rebound and no guarding.  Musculoskeletal:        General: No tenderness, deformity or edema.  Neurological: She is alert and oriented to person, place, and time. Coordination normal.  Skin: Skin is warm and dry. No rash noted. She is not diaphoretic. No erythema. No pallor.  Psychiatric: She has a normal mood and affect. Her behavior is normal. Judgment and thought content normal.    Assessment/Plan: Acute calculous cholecystitis S/p ERCP/sphincterotomy for choledocholithiasis Hyperbilirubinemia  NPO after midnight. Would plan lap chole tomorrow.  Operative surgeon will determine whether cholangiogram is performed.   The surgical procedure was described to the patient in detail.  The patient was given educational material.  I discussed the incision type and location, the location of the gallbladder, the anatomy of the bile ducts and arteries, and the typical progression of surgery.  I discussed the possibility of converting to an open operation.  I advised of the risks of bleeding, infection, damage to other structures (such as the bile duct, intestine or liver), bile leak, need for other procedures or surgeries.   We discussed the  risk of blood clot.  We  discussed the recovery period and post operative restrictions.        Jakera Beaupre 11/25/2018, 7:48 PM     

## 2018-11-25 NOTE — ED Provider Notes (Signed)
[facility] Provider Note   CSN: [id number] Arrival date & time: 11/25/18  1351    History   Chief Complaint Chief Complaint  Patient presents with  . Abdominal Pain  . Fever    HPI Janice Rodriguez is a 63 y.o. female.     HPI Patient presents with right upper quadrant pain.  8 days ago had ERCP with stone extraction and sphincterotomy by Dr. Henrene Pastor.  Has had some vomiting today.  States the pain began shortly after the procedure but was good for about a day.  States fevers up to 101.7 last night.  Pain is far in the right upper quadrant.  She is worried that her gallbladder.  Slight diarrhea.  Which is not unusual for her.  Vomit up bile.  Had been on Cipro while in the hospital. Past Medical History:  Diagnosis Date  . ASYMPTOMATIC POSTMENOPAUSAL STATUS   . Blood transfusion without reported diagnosis 1980's  . Female cystocele   . Gall stones, common bile duct    dialated bile duct per [place] called today 11-13-2018  . GERD   . History of irregular heartbeat   . HYPERLIPIDEMIA   . HYPERTENSION   . Left atrial enlargement 12/2013   Noted on ECHO  . LVH (left ventricular hypertrophy) 12/2013   Mild, noted on ECHO  . OBESITY   . SCOLIOSIS   . 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 UMBILICAL HERNIORRHAPHY, HX OF    age 61  . Vitamin D deficiency   . Wears glasses     Patient Active Problem List   Diagnosis Date Noted  . Bile duct stone   . Other hyperlipidemia 03/30/2017  . Routine general medical examination at a health care facility 11/21/2015  . Vitamin D deficiency 10/17/2014  . Palpitations 11/05/2013  . Moderate obesity 07/08/2010  . Essential hypertension 06/24/2010  . GERD 06/24/2010  . SCOLIOSIS 06/24/2010    Past Surgical History:  Procedure Laterality Date  . COLONOSCOPY  2012   [place]  . COLONOSCOPY  10/2015  .  [place]  . ENDOSCOPIC RETROGRADE CHOLANGIOPANCREATOGRAPHY (ERCP) WITH PROPOFOL N/A 11/17/2018   Procedure: ENDOSCOPIC RETROGRADE CHOLANGIOPANCREATOGRAPHY (ERCP) WITH PROPOFOL;  Surgeon: Irene Shipper, MD;  Location: [facility];  Service: Endoscopy;  Laterality: N/A;  . REMOVAL OF STONES  11/17/2018   Procedure: REMOVAL OF STONES;  Surgeon: Irene Shipper, MD;  Location: [facility];  Service: Endoscopy;;  . [name]  11/17/2018   Procedure: SPHINCTEROTOMY;  Surgeon: Irene Shipper, MD;  Location: [facility];  Service: Endoscopy;;  . [place]  . UPPER GASTROINTESTINAL ENDOSCOPY  10/2015  . VAGINAL HYSTERECTOMY  1992     OB History   No obstetric history on file.      Home Medications    Prior to Admission medications   Medication Sig Start Date End Date Taking? Authorizing Provider  aspirin 81 MG tablet Take 81 mg by mouth daily.    [provider]  calcium carbonate (TUMS - DOSED IN MG ELEMENTAL CALCIUM) 500 MG chewable tablet Chew 1 tablet by mouth as needed for indigestion or heartburn. Reported on 11/21/2015    [provider]  candesartan (ATACAND) 16 MG tablet TAKE 1 TABLET BY MOUTH ONCE DAILY 06/12/18   Belva Crome, MD  Cholecalciferol (VITAMIN D3 PO) Take 1 tablet by mouth daily.  [provider]  ciprofloxacin (CIPRO) 500 MG tablet Take 1 tablet (500 mg total) by mouth 2 (two) times daily. Go ahead and take 2 doses today 11/14/18   Irene Shipper, MD  esomeprazole (NEXIUM) 20 MG capsule Take 20 mg by mouth 2 (two) times a day. Will tke these 20 mg OTC until runs out then will start the script per Dr Henrene Pastor    [provider]  esomeprazole (NEXIUM) 40 MG capsule Take 1 capsule (40 mg total) by mouth 2 (two) times daily before a meal. Patient not taking: Reported on 11/13/2018 11/02/18   Irene Shipper, MD  hydrochlorothiazide (MICROZIDE) 12.5 MG capsule Take 1 capsule (12.5 mg total) by mouth  every morning. 04/18/18   Belva Crome, MD  metoprolol succinate (TOPROL-XL) 50 MG 24 hr tablet Take 1.5 tablets (75 mg) by mouth daily with or immediately following a meal. 04/18/18   Belva Crome, MD  potassium chloride SA (K-DUR,KLOR-CON) 20 MEQ tablet Take 1 tablet (20 mEq total) by mouth daily. 04/18/18   Belva Crome, MD  Probiotic Product (PROBIOTIC DAILY PO) Take 1 capsule by mouth daily as needed ((Patient Perference)). Reported on 11/21/2015    [provider]  vitamin C (ASCORBIC ACID) 500 MG tablet Take 500 mg by mouth daily.    [provider]    Family History Family History  Problem Relation Age of Onset  . Breast cancer Mother   . Diabetes Mother 54  . Stroke Father   . Heart disease Father   . Colon cancer Father        not sure of age dx  . Stroke Brother   . Diabetes Brother   . Sarcoidosis Brother   . Colon cancer Paternal Aunt   . Diabetes Other   . Hypertension Other   . Hyperlipidemia Other   . Heart attack Brother   . Esophageal cancer Neg Hx   . Pancreatic cancer Neg Hx   . Rectal cancer Neg Hx   . Stomach cancer Neg Hx   . Colon polyps Neg Hx     Social History Social History   Tobacco Use  . Smoking status: Passive Smoke Exposure - Never Smoker  . Smokeless tobacco: Never Used  Substance Use Topics  . Alcohol use: Yes    Alcohol/week: 0.0 standard drinks    Comment: occas wine  . Drug use: No     Allergies   Patient has no known allergies.   Review of Systems Review of Systems  Constitutional: Positive for appetite change, chills and fever.  HENT: Negative for congestion.   Respiratory: Negative for shortness of breath.   Gastrointestinal: Positive for abdominal pain, diarrhea, nausea and vomiting.  Genitourinary: Negative for flank pain.  Musculoskeletal: Negative for back pain.  Skin: Negative for rash.  Neurological: Negative for weakness.  Psychiatric/Behavioral: Negative for confusion.     Physical  Exam Updated Vital Signs BP 135/70   Pulse 74   Temp 98.6 F (37 C) (Oral)   Resp 20   SpO2 99%   Physical Exam Vitals signs and nursing note reviewed.  Constitutional:      Appearance: She is well-developed.  HENT:     Head: Atraumatic.  Eyes:     General: No scleral icterus. Cardiovascular:     Rate and Rhythm: Regular rhythm.  Pulmonary:     Breath sounds: Normal breath sounds.  Abdominal:     Hernia: No hernia is present.  Comments: Right upper quadrant tenderness without rebound or guarding.  Skin:    General: Skin is warm.     Capillary Refill: Capillary refill takes less than 2 seconds.  Neurological:     Mental Status: She is alert.     Comments: Patient is awake pleasant and appears to be at her baseline.      ED Treatments / Results  Labs (all labs ordered are listed, but only abnormal results are displayed) Labs Reviewed  CBC WITH DIFFERENTIAL/PLATELET - Abnormal; Notable for the following components:      Result Value   WBC 14.5 (*)    Hemoglobin 11.5 (*)    HCT 35.4 (*)    RDW 16.4 (*)    Neutro Abs 12.4 (*)    All other components within normal limits  COMPREHENSIVE METABOLIC PANEL - Abnormal; Notable for the following components:   Glucose, Bld 131 (*)    Creatinine, Ser 1.44 (*)    Albumin 3.1 (*)    Alkaline Phosphatase 160 (*)    Total Bilirubin 1.7 (*)    GFR calc non Af Amer 38 (*)    GFR calc Af Amer 44 (*)    All other components within normal limits  SARS CORONAVIRUS 2 (HOSPITAL ORDER, [facility])  CULTURE, BLOOD (ROUTINE X 2)  CULTURE, BLOOD (ROUTINE X 2)  LIPASE, BLOOD  LACTIC ACID, PLASMA  URINALYSIS, ROUTINE W REFLEX MICROSCOPIC    EKG None  Radiology Ct Abdomen Pelvis W Contrast  Result Date: 11/25/2018 CLINICAL DATA:  Right upper quadrant pain. ERCP Nov 17, 2018. Nausea for several days. Fever last night. Vomiting this morning. EXAM: CT ABDOMEN AND PELVIS WITH CONTRAST TECHNIQUE:  Multidetector CT imaging of the abdomen and pelvis was performed using the standard protocol following bolus administration of intravenous contrast. CONTRAST:  63m OMNIPAQUE IOHEXOL 300 MG/ML  SOLN COMPARISON:  Ultrasound Nov 13, 2018 FINDINGS: Lower chest: There is a small hiatal hernia. The lung bases and lower chest are otherwise normal. Hepatobiliary: No liver masses are identified. The gallbladder is somewhat thick walled. There is fat stranding adjacent to the gallbladder. There is also a small amount of fluid in the right upper quadrant, extending inferior to the gallbladder. There is air in the bile ducts consistent with recent ERCP and sphincterotomy. Intra and extrahepatic biliary duct prominence remains. Portal vein is patent. Pancreas: The fat stranding in fluid in the right upper quadrant abuts the pancreatic head. However, the pancreas itself is grossly unremarkable. Spleen: Normal in size without focal abnormality. Adrenals/Urinary Tract: Adrenal glands are normal. The kidneys are unremarkable. No hydronephrosis or perinephric stranding. The ureters are normal. The bladder is unremarkable. Stomach/Bowel: There is a small hiatal hernia. The stomach is otherwise normal. The duodenum is unremarkable. The remainder of the small bowel is normal. Colonic diverticulosis is seen without diverticulitis. Visualized appendix is normal. Vascular/Lymphatic: Atherosclerotic changes are seen in the tortuous nonaneurysmal aorta. No adenopathy. Reproductive: Status post hysterectomy. No adnexal masses. Other: No abdominal wall hernia or abnormality. No abdominopelvic ascites. Musculoskeletal: Scoliotic curvature lumbar spine, apex to the right. Degenerative changes in the thoracolumbar spine including the facets. IMPRESSION: 1. There is mild gallbladder wall thickening. There is fat stranding and fluid in the right upper quadrant abutting the duodenum, gallbladder, and pancreatic head. Findings are not specific but  concerning for acute cholecystitis or cholangitis. The patient has a normal lipase today making pancreatitis unlikely. The duodenum demonstrates no definitive wall thickening making duodenitis less  likely. Electronically Signed   By: Dorise Bullion III M.D   On: 11/25/2018 18:18    Procedures Procedures (including critical care time)  Medications Ordered in ED Medications  sodium chloride 0.9 % bolus 1,000 mL (0 mLs Intravenous Stopped 11/25/18 1755)  piperacillin-tazobactam (ZOSYN) IVPB 3.375 g (0 g Intravenous Stopped 11/25/18 1635)  sodium chloride 0.9 % bolus 1,000 mL (1,000 mLs Intravenous New Bag/Given 11/25/18 1732)  iohexol (OMNIPAQUE) 300 MG/ML solution 100 mL (80 mLs Intravenous Contrast Given 11/25/18 1741)     Initial Impression / Assessment and Plan / ED Course  I have reviewed the triage vital signs and the nursing notes.  Pertinent labs & imaging results that were available during my care of the patient were reviewed by me and considered in my medical decision making (see chart for details).        Patient with abdominal pain and fever with recent ERCP with stone removal and sphincterotomy.  LFTs show mildly elevated bili and alk phos but both improved from prior.  White count elevated.  Febrile yesterday.  CT scan showed possible cholecystitis or cholangitis.  Discussed with general surgery and will admit to internal medicine.  I had discussed with [facility] early in the course of care also.  Final Clinical Impressions(s) / ED Diagnoses   Final diagnoses:  Cholangitis    ED Discharge Orders    None       Davonna Belling, MD 11/25/18 [phone]

## 2018-11-25 NOTE — ED Notes (Signed)
Patient back from CT.

## 2018-11-25 NOTE — ED Notes (Signed)
Patient transported to CT 

## 2018-11-25 NOTE — H&P (Signed)
History and Physical    Janice Rodriguez XLK:[id number] DOB: June 08, 1956 DOA: 11/25/2018  PCP: Janice Morning, DO  Patient coming from: Home  I have personally briefly reviewed patient's old medical records in [place]  Chief Complaint: Right upper quadrant pain, chills, fever nausea and vomiting  HPI: Janice Rodriguez is a 63 y.o. female with medical history significant for hypertension who has been having intermittent right upper quadrant abdominal pain which radiates to her back for 3 to 4 weeks.  She has been evaluated by her GI physician, Dr. Henrene Pastor.  She had abdominal ultrasound on 11/13/2018 which showed a dilated common bile duct.  She was started on oral ciprofloxacin.  She underwent ERCP on 11/17/2018 was found to have choledocholithiasis status post ERCP with sphincterotomy and successful bile duct stone extraction.  Since her ERCP, she has had continued intermittent mild right upper quadrant pain which has been 6-7 out of 10 at its most intense with continued radiation to her back.  Pain has been well controlled with as needed Tylenol.  This Rodriguez she developed shaking chills and fever at home.  She had nausea with one episode of bilious emesis.  Due to worsening symptoms she presented to the ED for further evaluation.  She denies any associated chest pain, palpitations, dyspnea, diarrhea, constipation, or dysuria.  She takes a daily aspirin 81 mg last dose this Rodriguez 11/25/2018.  She has no known history of CAD or CVA.  ED Course:  Initial vitals showed BP 99/61, pulse 99, RR 16, temp 98.6 Fahrenheit, SPO2 98% on room air.  Labs are notable for WBC 14.5, hemoglobin 11.5, platelets 264,000, BUN 9, creatinine 1.44, AST 19, ALT 16, alk phos 160, total bilirubin 1.7, lipase 21, lactic acid 1.2.  Blood cultures were obtained in process.  SARS-CoV-2 test was negative.  CT abdomen/pelvis showed gallbladder wall thickening changes suspicious for acute cholecystitis or  cholangitis.    General surgery were consulted and recommended medical admission and plan for cholecystectomy in a.m.  EDP also discussed case with [facility] who are aware of case.  Patient was given 2 L normal saline and started on IV Zosyn.  The hospitalist service was consulted to admit.  Review of Systems: All systems reviewed and are negative except as documented in history of present illness above.   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, CXR: No definite evidence to suggest active intrathoracic tuberculosis    UMBILICAL HERNIORRHAPHY, HX OF    age 24   Vitamin D deficiency    Wears glasses     Past Surgical History:  Procedure Laterality Date   COLONOSCOPY  2012   Tics   COLONOSCOPY  10/2015   ECTOPIC PREGNANCY SURGERY  1983   ENDOSCOPIC RETROGRADE CHOLANGIOPANCREATOGRAPHY (ERCP) WITH PROPOFOL N/A 11/17/2018   Procedure: ENDOSCOPIC RETROGRADE CHOLANGIOPANCREATOGRAPHY (ERCP) WITH PROPOFOL;  Surgeon: Irene Shipper, MD;  Location: [facility];  Service: Endoscopy;  Laterality:  N/A;   REMOVAL OF STONES  11/17/2018   Procedure: REMOVAL OF STONES;  Surgeon: Irene Shipper, MD;  Location: [facility];  Service: Endoscopy;;   SPHINCTEROTOMY  11/17/2018   Procedure: [name];  Surgeon: Irene Shipper, MD;  Location: [facility];  Service: Endoscopy;;   [place]   UPPER GASTROINTESTINAL ENDOSCOPY  10/2015   VAGINAL HYSTERECTOMY  1992    Social History:  reports  that she is a non-smoker but has been exposed to tobacco smoke. She has never used smokeless tobacco. She reports current alcohol use. She reports that she does not use drugs.  No Known Allergies  Family History  Problem Relation Age of Onset   Breast cancer Mother    Diabetes Mother 29   Stroke Father    Heart disease Father    Colon cancer Father        not sure of age dx   Stroke Brother    Diabetes Brother    Sarcoidosis Brother    Colon cancer Paternal Aunt    Diabetes Other    Hypertension Other    Hyperlipidemia Other    Heart attack Brother    Esophageal cancer Neg Hx    Pancreatic cancer Neg Hx    Rectal cancer Neg Hx    Stomach cancer Neg Hx    Colon polyps Neg Hx      Prior to Admission medications   Medication Sig Start Date End Date Taking? Authorizing Provider  aspirin 81 MG tablet Take 81 mg by mouth daily.    [provider]  calcium carbonate (TUMS - DOSED IN MG ELEMENTAL CALCIUM) 500 MG chewable tablet Chew 1 tablet by mouth as needed for indigestion or heartburn. Reported on 11/21/2015    [provider]  candesartan (ATACAND) 16 MG tablet TAKE 1 TABLET BY MOUTH ONCE DAILY 06/12/18   Belva Crome, MD  Cholecalciferol (VITAMIN D3 PO) Take 1 tablet by mouth daily.     [provider]  ciprofloxacin (CIPRO) 500 MG tablet Take 1 tablet (500 mg total) by mouth 2 (two) times daily. Go ahead and take 2 doses today 11/14/18   Irene Shipper, MD  esomeprazole (NEXIUM) 20 MG capsule Take 20 mg by mouth 2 (two) times a day. Will tke these 20 mg OTC until runs out then will start the script per Dr Henrene Pastor    [provider]  esomeprazole (NEXIUM) 40 MG capsule Take 1 capsule (40 mg total) by mouth 2 (two) times daily before a meal. Patient not taking: Reported on 11/13/2018 11/02/18   Irene Shipper, MD  hydrochlorothiazide (MICROZIDE) 12.5 MG capsule Take 1 capsule (12.5 mg total) by mouth every Rodriguez. 04/18/18   Belva Crome, MD  metoprolol succinate (TOPROL-XL) 50 MG 24 hr tablet Take 1.5 tablets (75 mg) by mouth daily with or immediately following a meal. 04/18/18   Belva Crome, MD  potassium chloride SA (K-DUR,KLOR-CON) 20 MEQ tablet Take 1 tablet (20 mEq total) by mouth daily. 04/18/18   Belva Crome, MD  Probiotic Product (PROBIOTIC DAILY PO) Take 1 capsule by mouth daily as needed ((Patient Perference)). Reported on 11/21/2015    [provider]  vitamin C (ASCORBIC ACID) 500 MG tablet Take 500 mg by mouth daily.    [provider]    Physical Exam: Vitals:   11/25/18 1700 11/25/18 1715 11/25/18 1730 11/25/18 1909  BP: 134/72 137/72 135/70 135/71  Pulse: 74 72  74 76  Resp: _0 Temp:      TempSrc:      SpO2: 98% 99% 99% 100%    Constitutional: Resting supine in bed, NAD, calm, comfortable Eyes: PERRL, lids and conjunctivae normal ENMT: Mucous membranes are moist. Posterior pharynx clear of any exudate or lesions. Neck: normal, supple, no masses. Respiratory: clear to auscultation bilaterally, no wheezing, no crackles. Normal respiratory effort. No accessory muscle use.  Cardiovascular: Regular rate and rhythm, no murmurs / rubs / gallops. No extremity edema.  Abdomen: Tender palpation right upper quadrant. No hepatosplenomegaly. Bowel sounds positive.  Musculoskeletal: no clubbing / cyanosis. No joint deformity upper and lower extremities. Good ROM, no contractures. Normal muscle tone.  Skin: no rashes, lesions, ulcers. No induration Neurologic: CN 2-12 grossly intact. Sensation intact, Strength 5/5 in all 4.  Psychiatric: Normal judgment and insight. Alert and oriented x 3. Normal mood.     Labs on Admission: I have personally reviewed following labs and imaging studies  CBC: Recent Labs  Lab 11/25/18 1530  WBC 14.5*  NEUTROABS 12.4*  HGB 11.5*  HCT 35.4*  MCV 81.2  PLT 213   Basic Metabolic Panel: Recent Labs  Lab 11/25/18 1530  NA 138  K 3.5   CL 100  CO2 25  GLUCOSE 131*  BUN 9  CREATININE 1.44*  CALCIUM 9.0   GFR: Estimated Creatinine Clearance: 43.7 mL/min (A) (by C-G formula based on SCr of 1.44 mg/dL (H)). Liver Function Tests: Recent Labs  Lab 11/25/18 1530  AST 19  ALT 16  ALKPHOS 160*  BILITOT 1.7*  PROT 7.5  ALBUMIN 3.1*   Recent Labs  Lab 11/25/18 1530  LIPASE 21   No results for input(s): AMMONIA in the last 168 hours. Coagulation Profile: No results for input(s): INR, PROTIME in the last 168 hours. Cardiac Enzymes: No results for input(s): CKTOTAL, CKMB, CKMBINDEX, TROPONINI in the last 168 hours. BNP (last 3 results) No results for input(s): PROBNP in the last 8760 hours. HbA1C: No results for input(s): HGBA1C in the last 72 hours. CBG: No results for input(s): GLUCAP in the last 168 hours. Lipid Profile: No results for input(s): CHOL, HDL, LDLCALC, TRIG, CHOLHDL, LDLDIRECT in the last 72 hours. Thyroid Function Tests: No results for input(s): TSH, T4TOTAL, FREET4, T3FREE, THYROIDAB in the last 72 hours. Anemia Panel: No results for input(s): VITAMINB12, FOLATE, FERRITIN, TIBC, IRON, RETICCTPCT in the last 72 hours. Urine analysis:    Component Value Date/Time   COLORURINE YELLOW 10/17/2014 0909   APPEARANCEUR CLEAR 10/17/2014 0909   LABSPEC 1.010 10/17/2014 0909   PHURINE 5.5 10/17/2014 0909   GLUCOSEU NEGATIVE 10/17/2014 0909   HGBUR TRACE-INTACT (A) 10/17/2014 0909   BILIRUBINUR NEGATIVE 10/17/2014 0909   KETONESUR NEGATIVE 10/17/2014 0909   UROBILINOGEN 0.2 10/17/2014 0909   NITRITE NEGATIVE 10/17/2014 0909   LEUKOCYTESUR LARGE (A) 10/17/2014 0909    Radiological Exams on Admission: Ct Abdomen Pelvis W Contrast  Result Date: 11/25/2018 CLINICAL DATA:  Right upper quadrant pain. ERCP Nov 17, 2018. Nausea for several days. Fever last night. Vomiting this Rodriguez. EXAM: CT ABDOMEN AND PELVIS WITH CONTRAST TECHNIQUE: Multidetector CT imaging of the abdomen and pelvis was performed  using the standard protocol following bolus administration of intravenous contrast. CONTRAST:  68m OMNIPAQUE IOHEXOL 300 MG/ML  SOLN COMPARISON:  Ultrasound Nov 13, 2018 FINDINGS: Lower chest: There is a small hiatal hernia. The lung bases and lower chest are otherwise normal. Hepatobiliary: No liver masses are identified.  The gallbladder is somewhat thick walled. There is fat stranding adjacent to the gallbladder. There is also a small amount of fluid in the right upper quadrant, extending inferior to the gallbladder. There is air in the bile ducts consistent with recent ERCP and sphincterotomy. Intra and extrahepatic biliary duct prominence remains. Portal vein is patent. Pancreas: The fat stranding in fluid in the right upper quadrant abuts the pancreatic head. However, the pancreas itself is grossly unremarkable. Spleen: Normal in size without focal abnormality. Adrenals/Urinary Tract: Adrenal glands are normal. The kidneys are unremarkable. No hydronephrosis or perinephric stranding. The ureters are normal. The bladder is unremarkable. Stomach/Bowel: There is a small hiatal hernia. The stomach is otherwise normal. The duodenum is unremarkable. The remainder of the small bowel is normal. Colonic diverticulosis is seen without diverticulitis. Visualized appendix is normal. Vascular/Lymphatic: Atherosclerotic changes are seen in the tortuous nonaneurysmal aorta. No adenopathy. Reproductive: Status post hysterectomy. No adnexal masses. Other: No abdominal wall hernia or abnormality. No abdominopelvic ascites. Musculoskeletal: Scoliotic curvature lumbar spine, apex to the right. Degenerative changes in the thoracolumbar spine including the facets. IMPRESSION: 1. There is mild gallbladder wall thickening. There is fat stranding and fluid in the right upper quadrant abutting the duodenum, gallbladder, and pancreatic head. Findings are not specific but concerning for acute cholecystitis or cholangitis. The patient has  a normal lipase today making pancreatitis unlikely. The duodenum demonstrates no definitive wall thickening making duodenitis less likely. Electronically Signed   By: Dorise Bullion III M.D   On: 11/25/2018 18:18    EKG: Ordered and pending.  Assessment/Plan Principal Problem:   Cholangitis Active Problems:   Essential hypertension   AKI (acute kidney injury) ([place])  Janice Rodriguez is a 63 y.o. female with medical history significant for hypertension who was admitted for acute calculus cholecystitis versus cholangitis.   Acute calculus cholecystitis versus cholangitis: -S/p ERCP with sphincterotomy and successful bile duct stone extraction on 11/17/2018 -Continue IV Zosyn -Follow blood cultures -General surgery consulted, plan for lap chole in a.m. -Keep n.p.o., hold pharmacologic VTE prophylaxis -Antiemetics and pain medications as needed  Acute kidney injury: Likely prerenal from acute illness.  Given 2 L normal saline in the ED.  Received IV contrast with CT imaging. -Continue gentle IV fluid hydration overnight and recheck labs in a.m.  Hypertension: Currently stable.  Continue home Toprol-XL. Holding HCTZ and candesartan due to AKI.  DVT prophylaxis: SCDs Code Status: Full code, confirmed with patient Family Communication: Patient declined, she will communicate with family Disposition Plan: Pending surgical intervention and postop recovery Consults called: General surgery, GI consulted by EDP Admission status: Inpatient, patient likely requires greater than 2 midnight stay for management of acute cholecystitis with cholangitis requiring surgical intervention and IV antibiotics.   Zada Finders MD [facility]  If 7PM-7AM, please contact night-coverage [email or web address]  11/25/2018, 10:02 PM

## 2018-11-25 NOTE — Consult Note (Signed)
Reason for Consult: gallstones Referring Physician: [name] is an 63 y.o. female.  HPI:  Patient is a 63 year old female who has been having intermittent abdominal pain going to her back for around 3 to 4 weeks.  It started to become more severe and she called Dr. Henrene Pastor who is her GI doctor.  He worked her up and she was seen to have a dilated common bile duct on ultrasound as well as gallstones.  She underwent ERCP last week and had numerous gallstones removed from her common bile duct as well as a sphincterotomy performed.  She states that she never felt entirely well but did improve.  However the pain continued to be severe.  She also started having shaking chills again today and was told to come to the emergency department.  Upon her evaluation here, her bilirubin is 1.7 which is down from previous.  Her alk phos is still elevated but her transaminases have returned to normal.  Lipase is normal.  White count is elevated.  CT was performed showing mild gallbladder wall thickening.  When asked, she also states that she has been losing weight.    She has no knowledge of gallbladder disease in her family.  Of note, she has had an umbilical hernia repair when she was 6.   Past Medical History:  Diagnosis Date  . ASYMPTOMATIC POSTMENOPAUSAL STATUS   . Blood transfusion without reported diagnosis 1980's  . Female cystocele   . Gall stones, common bile duct    dialated bile duct per [place] called today 11-13-2018  . GERD   . History of irregular heartbeat   . HYPERLIPIDEMIA   . HYPERTENSION   . Left atrial enlargement 12/2013   Noted on ECHO  . LVH (left ventricular hypertrophy) 12/2013   Mild, noted on ECHO  . OBESITY   . SCOLIOSIS   . 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 UMBILICAL HERNIORRHAPHY, HX OF    age 23  . Vitamin D deficiency   . Wears glasses     Past Surgical  History:  Procedure Laterality Date  . COLONOSCOPY  2012   [place]  . COLONOSCOPY  10/2015  . [place]  . ENDOSCOPIC RETROGRADE CHOLANGIOPANCREATOGRAPHY (ERCP) WITH PROPOFOL N/A 11/17/2018   Procedure: ENDOSCOPIC RETROGRADE CHOLANGIOPANCREATOGRAPHY (ERCP) WITH PROPOFOL;  Surgeon: Irene Shipper, MD;  Location: [facility];  Service: Endoscopy;  Laterality: N/A;  . REMOVAL OF STONES  11/17/2018   Procedure: REMOVAL OF STONES;  Surgeon: Irene Shipper, MD;  Location: [facility];  Service: Endoscopy;;  . [name]  11/17/2018   Procedure: SPHINCTEROTOMY;  Surgeon: Irene Shipper, MD;  Location: [facility];  Service: Endoscopy;;  . [place]  . UPPER GASTROINTESTINAL ENDOSCOPY  10/2015  . VAGINAL HYSTERECTOMY  1992    Family History  Problem Relation Age of Onset  . Breast cancer Mother   . Diabetes Mother 47  . Stroke Father   . Heart disease Father   . Colon cancer Father        not sure of age dx  . Stroke Brother   . Diabetes Brother   . Sarcoidosis Brother   . Colon cancer Paternal Aunt   . Diabetes Other   . Hypertension Other   . Hyperlipidemia Other   . Heart attack Brother   . Esophageal cancer Neg Hx   . Pancreatic  cancer Neg Hx   . Rectal cancer Neg Hx   . Stomach cancer Neg Hx   . Colon polyps Neg Hx     Social History:  reports that she is a non-smoker but has been exposed to tobacco smoke. She has never used smokeless tobacco. She reports current alcohol use. She reports that she does not use drugs.  Allergies: No Known Allergies  Medications: I have reviewed the patient's current medications.  Results for orders placed or performed during the hospital encounter of 11/25/18 (from the past 48 hour(s))  CBC with Differential     Status: Abnormal   Collection Time: 11/25/18  3:30 PM  Result Value Ref Range   WBC 14.5 (H) 4.0 - 10.5 K/uL   RBC 4.36 3.87 - 5.11 MIL/uL   Hemoglobin 11.5 (L) 12.0 - 15.0 g/dL   HCT  35.4 (L) 36.0 - 46.0 %   MCV 81.2 80.0 - 100.0 fL   MCH 26.4 26.0 - 34.0 pg   MCHC 32.5 30.0 - 36.0 g/dL   RDW 16.4 (H) 11.5 - 15.5 %   Platelets 264 150 - 400 K/uL   nRBC 0.0 0.0 - 0.2 %   Neutrophils Relative % 85 %   Neutro Abs 12.4 (H) 1.7 - 7.7 K/uL   Lymphocytes Relative 9 %   Lymphs Abs 1.3 0.7 - 4.0 K/uL   Monocytes Relative 5 %   Monocytes Absolute 0.8 0.1 - 1.0 K/uL   Eosinophils Relative 0 %   Eosinophils Absolute 0.0 0.0 - 0.5 K/uL   Basophils Relative 0 %   Basophils Absolute 0.0 0.0 - 0.1 K/uL   Immature Granulocytes 1 %   Abs Immature Granulocytes 0.07 0.00 - 0.07 K/uL    Comment: Performed at [facility], [address], [place], [place] [address]  Lipase, blood     Status: None   Collection Time: 11/25/18  3:30 PM  Result Value Ref Range   Lipase 21 11 - 51 U/L    Comment: Performed at [facility] [address] [place], [place] [address]  Comprehensive metabolic panel     Status: Abnormal   Collection Time: 11/25/18  3:30 PM  Result Value Ref Range   Sodium 138 135 - 145 mmol/L   Potassium 3.5 3.5 - 5.1 mmol/L   Chloride 100 98 - 111 mmol/L   CO2 25 22 - 32 mmol/L   Glucose, Bld 131 (H) 70 - 99 mg/dL   BUN 9 8 - 23 mg/dL   Creatinine, Ser 1.44 (H) 0.44 - 1.00 mg/dL   Calcium 9.0 8.9 - 10.3 mg/dL   Total Protein 7.5 6.5 - 8.1 g/dL   Albumin 3.1 (L) 3.5 - 5.0 g/dL   AST 19 15 - 41 U/L   ALT 16 0 - 44 U/L   Alkaline Phosphatase 160 (H) 38 - 126 U/L   Total Bilirubin 1.7 (H) 0.3 - 1.2 mg/dL   GFR calc non Af Amer 38 (L) >60 mL/min   GFR calc Af Amer 44 (L) >60 mL/min   Anion gap 13 5 - 15    Comment: Performed at [facility] [address] [place], [place] [address]  Lactic acid, plasma     Status: None   Collection Time: 11/25/18  3:30 PM  Result Value Ref Range   Lactic Acid, Venous 1.2 0.5 - 1.9 mmol/L    Comment: Performed at [facility] [address], [place], [place]  [address]  SARS Coronavirus 2 (CEPHEID -  Performed in [facility] hospital lab), Hosp Order     Status: None   Collection Time: 11/25/18  4:50 PM  Result Value Ref Range   SARS Coronavirus 2 NEGATIVE NEGATIVE    Comment: (NOTE) If result is NEGATIVE SARS-CoV-2 target nucleic acids are NOT DETECTED. The SARS-CoV-2 RNA is generally detectable in upper and lower  respiratory specimens during the acute phase of infection. The lowest  concentration of SARS-CoV-2 viral copies this assay can detect is 250  copies / mL. A negative result does not preclude SARS-CoV-2 infection  and should not be used as the sole basis for treatment or other  patient management decisions.  A negative result may occur with  improper specimen collection / handling, submission of specimen other  than nasopharyngeal swab, presence of viral mutation(s) within the  areas targeted by this assay, and inadequate number of viral copies  (<250 copies / mL). A negative result must be combined with clinical  observations, patient history, and epidemiological information. If result is POSITIVE SARS-CoV-2 target nucleic acids are DETECTED. The SARS-CoV-2 RNA is generally detectable in upper and lower  respiratory specimens dur ing the acute phase of infection.  Positive  results are indicative of active infection with SARS-CoV-2.  Clinical  correlation with patient history and other diagnostic information is  necessary to determine patient infection status.  Positive results do  not rule out bacterial infection or co-infection with other viruses. If result is PRESUMPTIVE POSTIVE SARS-CoV-2 nucleic acids MAY BE PRESENT.   A presumptive positive result was obtained on the submitted specimen  and confirmed on repeat testing.  While 2019 novel coronavirus  (SARS-CoV-2) nucleic acids may be present in the submitted sample  additional confirmatory testing may be necessary for epidemiological  and / or clinical management purposes  to differentiate between  SARS-CoV-2  and other Sarbecovirus currently known to infect humans.  If clinically indicated additional testing with an alternate test  methodology [phone]) is advised. The SARS-CoV-2 RNA is generally  detectable in upper and lower respiratory sp ecimens during the acute  phase of infection. The expected result is Negative. Fact Sheet for Patients:  StrictlyIdeas.no Fact Sheet for Healthcare Providers: BankingDealers.co.za This test is not yet approved or cleared by the [place] FDA and has been authorized for detection and/or diagnosis of SARS-CoV-2 by FDA under an Emergency Use Authorization (EUA).  This EUA will remain in effect (meaning this test can be used) for the duration of the COVID-19 declaration under Section 564(b)(1) of the Act, 21 U.S.C. section 360bbb-3(b)(1), unless the authorization is terminated or revoked sooner. Performed at [facility], [place] [address] [place], [place] [address]     Ct Abdomen Pelvis W Contrast  Result Date: 11/25/2018 CLINICAL DATA:  Right upper quadrant pain. ERCP Nov 17, 2018. Nausea for several days. Fever last night. Vomiting this morning. EXAM: CT ABDOMEN AND PELVIS WITH CONTRAST TECHNIQUE: Multidetector CT imaging of the abdomen and pelvis was performed using the standard protocol following bolus administration of intravenous contrast. CONTRAST:  38m OMNIPAQUE IOHEXOL 300 MG/ML  SOLN COMPARISON:  Ultrasound Nov 13, 2018 FINDINGS: Lower chest: There is a small hiatal hernia. The lung bases and lower chest are otherwise normal. Hepatobiliary: No liver masses are identified. The gallbladder is somewhat thick walled. There is fat stranding adjacent to the gallbladder. There is also a small amount of fluid in the right upper quadrant, extending inferior to the gallbladder. There  is air in the bile ducts consistent with recent ERCP and sphincterotomy. Intra and extrahepatic biliary duct prominence  remains. Portal vein is patent. Pancreas: The fat stranding in fluid in the right upper quadrant abuts the pancreatic head. However, the pancreas itself is grossly unremarkable. Spleen: Normal in size without focal abnormality. Adrenals/Urinary Tract: Adrenal glands are normal. The kidneys are unremarkable. No hydronephrosis or perinephric stranding. The ureters are normal. The bladder is unremarkable. Stomach/Bowel: There is a small hiatal hernia. The stomach is otherwise normal. The duodenum is unremarkable. The remainder of the small bowel is normal. Colonic diverticulosis is seen without diverticulitis. Visualized appendix is normal. Vascular/Lymphatic: Atherosclerotic changes are seen in the tortuous nonaneurysmal aorta. No adenopathy. Reproductive: Status post hysterectomy. No adnexal masses. Other: No abdominal wall hernia or abnormality. No abdominopelvic ascites. Musculoskeletal: Scoliotic curvature lumbar spine, apex to the right. Degenerative changes in the thoracolumbar spine including the facets. IMPRESSION: 1. There is mild gallbladder wall thickening. There is fat stranding and fluid in the right upper quadrant abutting the duodenum, gallbladder, and pancreatic head. Findings are not specific but concerning for acute cholecystitis or cholangitis. The patient has a normal lipase today making pancreatitis unlikely. The duodenum demonstrates no definitive wall thickening making duodenitis less likely. Electronically Signed   By: Dorise Bullion III M.D   On: 11/25/2018 18:18    Review of Systems  Constitutional: Positive for chills and fever.  HENT: Negative.   Respiratory: Negative.   Cardiovascular: Negative.   Gastrointestinal: Positive for abdominal pain and nausea.  Genitourinary: Negative.   Musculoskeletal: Negative.   Skin: Negative.   Neurological: Negative.   Endo/Heme/Allergies: Negative.   Psychiatric/Behavioral: Negative.    Blood pressure 135/71, pulse 76, temperature 98.6  F (37 C), temperature source Oral, resp. rate 20, SpO2 100 %. Physical Exam  Constitutional: She is oriented to person, place, and time. She appears well-developed and well-nourished. She appears distressed (looks uncomfortable).  HENT:  Head: Normocephalic and atraumatic.  Eyes: Pupils are equal, round, and reactive to light. Conjunctivae are normal. Right eye exhibits no discharge. Left eye exhibits no discharge. No scleral icterus.  Neck: Neck supple. No tracheal deviation present.  Cardiovascular: Normal rate, regular rhythm and intact distal pulses.  Respiratory: Effort normal. No respiratory distress. She exhibits no tenderness.  GI: Soft. She exhibits no distension. There is abdominal tenderness (mild tenderness RUQ). There is no rebound and no guarding.  Musculoskeletal:        General: No tenderness, deformity or edema.  Neurological: She is alert and oriented to person, place, and time. Coordination normal.  Skin: Skin is warm and dry. No rash noted. She is not diaphoretic. No erythema. No pallor.  Psychiatric: She has a normal mood and affect. Her behavior is normal. Judgment and thought content normal.    Assessment/Plan: Acute calculous cholecystitis S/p ERCP/sphincterotomy for choledocholithiasis Hyperbilirubinemia  NPO after midnight. Would plan lap chole tomorrow.  Operative surgeon will determine whether cholangiogram is performed.   The surgical procedure was described to the patient in detail.  The patient was given educational material.  I discussed the incision type and location, the location of the gallbladder, the anatomy of the bile ducts and arteries, and the typical progression of surgery.  I discussed the possibility of converting to an open operation.  I advised of the risks of bleeding, infection, damage to other structures (such as the bile duct, intestine or liver), bile leak, need for other procedures or surgeries.   We discussed the  risk of blood clot.  We  discussed the recovery period and post operative restrictions.        Stark Klein 11/25/2018, 7:48 PM

## 2018-11-26 ENCOUNTER — Inpatient Hospital Stay (HOSPITAL_COMMUNITY): Payer: 59 | Admitting: Certified Registered Nurse Anesthetist

## 2018-11-26 ENCOUNTER — Encounter (HOSPITAL_COMMUNITY)
Admission: EM | Disposition: A | Discharge status: Home Health | Payer: Self-pay | Source: Home / Self Care | Attending: Internal Medicine

## 2018-11-26 ENCOUNTER — Encounter (HOSPITAL_COMMUNITY): Payer: Self-pay | Admitting: *Deleted

## 2018-11-26 HISTORY — PX: CHOLECYSTECTOMY: SHX55

## 2018-11-26 LAB — COMPREHENSIVE METABOLIC PANEL
ALT: 13 U/L (ref 0–44)
AST: 16 U/L (ref 15–41)
Albumin: 2.6 g/dL — ABNORMAL LOW (ref 3.5–5.0)
Alkaline Phosphatase: 132 U/L — ABNORMAL HIGH (ref 38–126)
Anion gap: 11 (ref 5–15)
BUN: 5 mg/dL — ABNORMAL LOW (ref 8–23)
CO2: 25 mmol/L (ref 22–32)
Calcium: 8.3 mg/dL — ABNORMAL LOW (ref 8.9–10.3)
Chloride: 104 mmol/L (ref 98–111)
Creatinine, Ser: 0.94 mg/dL (ref 0.44–1.00)
GFR calc Af Amer: >60 mL/min (ref >60)
GFR calc non Af Amer: >60 mL/min (ref >60)
Glucose, Bld: 105 mg/dL — ABNORMAL HIGH (ref 70–99)
Potassium: 3 mmol/L — ABNORMAL LOW (ref 3.5–5.1)
Sodium: 140 mmol/L (ref 135–145)
Total Bilirubin: 1.4 mg/dL — ABNORMAL HIGH (ref 0.3–1.2)
Total Protein: 6.1 g/dL — ABNORMAL LOW (ref 6.5–8.1)

## 2018-11-26 LAB — BLOOD CULTURE ID PANEL (REFLEXED)

## 2018-11-26 LAB — CBC
HCT: 29.9 % — ABNORMAL LOW (ref 36.0–46.0)
Hemoglobin: 9.9 g/dL — ABNORMAL LOW (ref 12.0–15.0)
MCH: 26.5 pg (ref 26.0–34.0)
MCHC: 33.1 g/dL (ref 30.0–36.0)
MCV: 80.2 fL (ref 80.0–100.0)
Platelets: 220 10*3/uL (ref 150–400)
RBC: 3.73 MIL/uL — ABNORMAL LOW (ref 3.87–5.11)
RDW: 16.3 % — ABNORMAL HIGH (ref 11.5–15.5)
WBC: 10 10*3/uL (ref 4.0–10.5)
nRBC: 0 % (ref 0.0–0.2)

## 2018-11-26 LAB — URINALYSIS, ROUTINE W REFLEX MICROSCOPIC
Bilirubin Urine: NEGATIVE
Glucose, UA: NEGATIVE mg/dL
Hgb urine dipstick: NEGATIVE
Ketones, ur: 5 mg/dL — AB
Nitrite: NEGATIVE
Protein, ur: NEGATIVE mg/dL
Specific Gravity, Urine: 1.03 (ref 1.005–1.030)
pH: 5 (ref 5.0–8.0)

## 2018-11-26 LAB — SURGICAL PCR SCREEN
MRSA, PCR: NEGATIVE
Staphylococcus aureus: NEGATIVE

## 2018-11-26 LAB — HIV ANTIBODY (ROUTINE TESTING W REFLEX): HIV Screen 4th Generation wRfx: NONREACTIVE

## 2018-11-26 SURGERY — LAPAROSCOPIC CHOLECYSTECTOMY WITH INTRAOPERATIVE CHOLANGIOGRAM
Anesthesia: General | Site: Abdomen

## 2018-11-26 MED ORDER — POTASSIUM CHLORIDE 10 MEQ/100ML IV SOLN: 10.0000 meq | INTRAVENOUS | Status: DC

## 2018-11-26 MED ORDER — SODIUM CHLORIDE 0.9 % IR SOLN
Status: DC | PRN
  Administered 2018-11-26: 3000 mL

## 2018-11-26 MED ORDER — POTASSIUM CHLORIDE 10 MEQ/100ML IV SOLN
10.0000 meq | INTRAVENOUS | Status: DC
  Administered 2018-11-26 (×2): 10 meq via INTRAVENOUS
  Filled 2018-11-26 (×4): qty 100

## 2018-11-26 MED ORDER — MORPHINE SULFATE (PF) 2 MG/ML IV SOLN
1.0000 mg | INTRAVENOUS | Status: DC | PRN
  Administered 2018-11-26: 2 mg via INTRAVENOUS
  Filled 2018-11-26: qty 1

## 2018-11-26 MED ORDER — SODIUM CHLORIDE 0.9 % IV SOLN
INTRAVENOUS | Status: DC | PRN
  Administered 2018-11-26: 15 ug/min via INTRAVENOUS

## 2018-11-26 MED ORDER — LACTATED RINGERS IV SOLN
INTRAVENOUS | Status: DC | PRN
  Administered 2018-11-26: 13:00:00 via INTRAVENOUS

## 2018-11-26 MED ORDER — BUPIVACAINE-EPINEPHRINE (PF) 0.25% -1:200000 IJ SOLN
INTRAMUSCULAR | Status: AC
  Filled 2018-11-26: qty 30

## 2018-11-26 MED ORDER — HYDROMORPHONE HCL 1 MG/ML IJ SOLN: 0.2500 mg | INTRAMUSCULAR | Status: DC | PRN

## 2018-11-26 MED ORDER — KETOROLAC TROMETHAMINE 30 MG/ML IJ SOLN
INTRAMUSCULAR | Status: AC
  Filled 2018-11-26: qty 1

## 2018-11-26 MED ORDER — MAGNESIUM SULFATE 2 GM/50ML IV SOLN
2.0000 g | Freq: Once | INTRAVENOUS | Status: AC
  Administered 2018-11-26: 2 g via INTRAVENOUS
  Filled 2018-11-26: qty 50

## 2018-11-26 MED ORDER — ACETAMINOPHEN 10 MG/ML IV SOLN
INTRAVENOUS | Status: AC
  Filled 2018-11-26: qty 100

## 2018-11-26 MED ORDER — OXYCODONE HCL 5 MG/5ML PO SOLN: 5.0000 mg | Freq: Once | ORAL | Status: DC | PRN

## 2018-11-26 MED ORDER — 0.9 % SODIUM CHLORIDE (POUR BTL) OPTIME
TOPICAL | Status: DC | PRN
  Administered 2018-11-26: 1000 mL

## 2018-11-26 MED ORDER — DEXAMETHASONE SODIUM PHOSPHATE 10 MG/ML IJ SOLN
INTRAMUSCULAR | Status: DC | PRN
  Administered 2018-11-26: 10 mg via INTRAVENOUS

## 2018-11-26 MED ORDER — MIDAZOLAM HCL 2 MG/2ML IJ SOLN
INTRAMUSCULAR | Status: AC
  Filled 2018-11-26: qty 2

## 2018-11-26 MED ORDER — SUGAMMADEX SODIUM 200 MG/2ML IV SOLN
INTRAVENOUS | Status: DC | PRN
  Administered 2018-11-26: 200 mg via INTRAVENOUS

## 2018-11-26 MED ORDER — PROPOFOL 10 MG/ML IV BOLUS
INTRAVENOUS | Status: DC | PRN
  Administered 2018-11-26: 150 mg via INTRAVENOUS

## 2018-11-26 MED ORDER — ROCURONIUM BROMIDE 10 MG/ML (PF) SYRINGE
PREFILLED_SYRINGE | INTRAVENOUS | Status: DC | PRN
  Administered 2018-11-26: 40 mg via INTRAVENOUS

## 2018-11-26 MED ORDER — LIDOCAINE 2% (20 MG/ML) 5 ML SYRINGE
INTRAMUSCULAR | Status: DC | PRN
  Administered 2018-11-26: 60 mg via INTRAVENOUS

## 2018-11-26 MED ORDER — PIPERACILLIN-TAZOBACTAM 3.375 G IVPB 30 MIN
3.3750 g | Freq: Once | INTRAVENOUS | Status: AC
  Administered 2018-11-26: 16:00:00 3.375 g via INTRAVENOUS
  Filled 2018-11-26 (×2): qty 50

## 2018-11-26 MED ORDER — BUPIVACAINE-EPINEPHRINE 0.25% -1:200000 IJ SOLN
INTRAMUSCULAR | Status: DC | PRN
  Administered 2018-11-26: 13 mL

## 2018-11-26 MED ORDER — ACETAMINOPHEN 10 MG/ML IV SOLN: 1000.0000 mg | Freq: Once | INTRAVENOUS | Status: DC | PRN

## 2018-11-26 MED ORDER — ONDANSETRON HCL 4 MG/2ML IJ SOLN
INTRAMUSCULAR | Status: DC | PRN
  Administered 2018-11-26: 4 mg via INTRAVENOUS

## 2018-11-26 MED ORDER — FENTANYL CITRATE (PF) 250 MCG/5ML IJ SOLN
INTRAMUSCULAR | Status: DC | PRN
  Administered 2018-11-26 (×4): 50 ug via INTRAVENOUS

## 2018-11-26 MED ORDER — PHENYLEPHRINE HCL (PRESSORS) 10 MG/ML IV SOLN
INTRAVENOUS | Status: DC | PRN
  Administered 2018-11-26: 80 ug via INTRAVENOUS

## 2018-11-26 MED ORDER — KETOROLAC TROMETHAMINE 30 MG/ML IJ SOLN: 15.0000 mg | Freq: Once | INTRAMUSCULAR | Status: DC

## 2018-11-26 MED ORDER — OXYCODONE HCL 5 MG PO TABS: 5.0000 mg | ORAL_TABLET | Freq: Once | ORAL | Status: DC | PRN

## 2018-11-26 MED ORDER — MIDAZOLAM HCL 2 MG/2ML IJ SOLN
INTRAMUSCULAR | Status: DC | PRN
  Administered 2018-11-26: 2 mg via INTRAVENOUS

## 2018-11-26 MED ORDER — FENTANYL CITRATE (PF) 250 MCG/5ML IJ SOLN
INTRAMUSCULAR | Status: AC
  Filled 2018-11-26: qty 5

## 2018-11-26 MED ORDER — KETOROLAC TROMETHAMINE 15 MG/ML IJ SOLN
INTRAMUSCULAR | Status: AC
  Administered 2018-11-26: 15 mg
  Filled 2018-11-26: qty 1

## 2018-11-26 MED ORDER — ACETAMINOPHEN 10 MG/ML IV SOLN
INTRAVENOUS | Status: DC | PRN
  Administered 2018-11-26: 1000 mg via INTRAVENOUS

## 2018-11-26 MED ORDER — PROMETHAZINE HCL 25 MG/ML IJ SOLN: 6.2500 mg | INTRAMUSCULAR | Status: DC | PRN

## 2018-11-26 SURGICAL SUPPLY — 52 items
ADH SKN CLS APL DERMABOND .7 (GAUZE/BANDAGES/DRESSINGS) ×1
APPLIER CLIP 5 13 M/L LIGAMAX5 (MISCELLANEOUS) ×3
APR CLP MED LRG 5 ANG JAW (MISCELLANEOUS) ×1
BAG SPEC RTRVL 10 TROC 200 (ENDOMECHANICALS) ×1
BIOPATCH WHT 1IN DISK W/4.0 H (GAUZE/BANDAGES/DRESSINGS) ×2 IMPLANT
BLADE CLIPPER SURG (BLADE) IMPLANT
CANISTER SUCT 3000ML PPV (MISCELLANEOUS) ×3 IMPLANT
CHLORAPREP W/TINT 26ML (MISCELLANEOUS) ×3 IMPLANT
CLIP APPLIE 5 13 M/L LIGAMAX5 (MISCELLANEOUS) ×1 IMPLANT
CLOSURE WOUND 1/2 X4 (GAUZE/BANDAGES/DRESSINGS) ×1
COVER MAYO STAND STRL (DRAPES) IMPLANT
COVER SURGICAL LIGHT HANDLE (MISCELLANEOUS) ×3 IMPLANT
COVER WAND RF STERILE (DRAPES) ×3 IMPLANT
DERMABOND ADVANCED (GAUZE/BANDAGES/DRESSINGS) ×2
DERMABOND ADVANCED .7 DNX12 (GAUZE/BANDAGES/DRESSINGS) ×1 IMPLANT
DRAIN CHANNEL 19F RND (DRAIN) ×2 IMPLANT
DRAPE C-ARM 42X72 X-RAY (DRAPES) IMPLANT
DRSG TEGADERM 4X4.75 (GAUZE/BANDAGES/DRESSINGS) ×2 IMPLANT
ELECT REM PT RETURN 9FT ADLT (ELECTROSURGICAL) ×3
ELECTRODE REM PT RTRN 9FT ADLT (ELECTROSURGICAL) ×1 IMPLANT
EVACUATOR SILICONE 100CC (DRAIN) ×2 IMPLANT
GLOVE BIO SURGEON STRL SZ7 (GLOVE) ×5 IMPLANT
GLOVE BIOGEL PI IND STRL 7.5 (GLOVE) ×1 IMPLANT
GLOVE BIOGEL PI INDICATOR 7.5 (GLOVE) ×4
GOWN STRL REUS W/ TWL LRG LVL3 (GOWN DISPOSABLE) ×3 IMPLANT
GOWN STRL REUS W/TWL LRG LVL3 (GOWN DISPOSABLE) ×9
GRASPER SUT TROCAR 14GX15 (MISCELLANEOUS) ×3 IMPLANT
HEMOSTAT SNOW SURGICEL 2X4 (HEMOSTASIS) ×4 IMPLANT
IV NS IRRIG 3000ML ARTHROMATIC (IV SOLUTION) ×2 IMPLANT
KIT BASIN OR (CUSTOM PROCEDURE TRAY) ×3 IMPLANT
KIT TURNOVER KIT B (KITS) ×3 IMPLANT
NS IRRIG 1000ML POUR BTL (IV SOLUTION) ×3 IMPLANT
PAD ARMBOARD 7.5X6 YLW CONV (MISCELLANEOUS) ×3 IMPLANT
POUCH RETRIEVAL ECOSAC 10 (ENDOMECHANICALS) ×1 IMPLANT
POUCH RETRIEVAL ECOSAC 10MM (ENDOMECHANICALS) ×2
SCISSORS LAP 5X35 DISP (ENDOMECHANICALS) ×3 IMPLANT
SET CHOLANGIOGRAPH 5 50 .035 (SET/KITS/TRAYS/PACK) IMPLANT
SET IRRIG TUBING LAPAROSCOPIC (IRRIGATION / IRRIGATOR) ×3 IMPLANT
SET TUBE SMOKE EVAC HIGH FLOW (TUBING) ×3 IMPLANT
SLEEVE ENDOPATH XCEL 5M (ENDOMECHANICALS) ×6 IMPLANT
SPECIMEN JAR SMALL (MISCELLANEOUS) ×3 IMPLANT
STRIP CLOSURE SKIN 1/2X4 (GAUZE/BANDAGES/DRESSINGS) ×2 IMPLANT
SURGICEL SNOW 2X4 (HEMOSTASIS) ×2 IMPLANT
SUT MNCRL AB 4-0 PS2 18 (SUTURE) ×3 IMPLANT
SUT VICRYL 0 UR6 27IN ABS (SUTURE) ×3 IMPLANT
TOWEL OR 17X24 6PK STRL BLUE (TOWEL DISPOSABLE) ×3 IMPLANT
TOWEL OR 17X26 10 PK STRL BLUE (TOWEL DISPOSABLE) ×3 IMPLANT
TRAY LAPAROSCOPIC MC (CUSTOM PROCEDURE TRAY) ×3 IMPLANT
TROCAR OPTI BLUNT TIP 12M 100M (ENDOMECHANICALS) ×2 IMPLANT
TROCAR XCEL BLUNT TIP 100MML (ENDOMECHANICALS) ×3 IMPLANT
TROCAR XCEL NON-BLD 5MMX100MML (ENDOMECHANICALS) ×3 IMPLANT
WATER STERILE IRR 1000ML POUR (IV SOLUTION) ×3 IMPLANT

## 2018-11-26 NOTE — Progress Notes (Signed)
PROGRESS NOTE  Sonya Gunnoe KWI:[id number] DOB: [birth year] DOA: 11/25/2018 PCP: [name], DO  HPI/Recap of past 24 hours: Alleene Stoy is a 63 y.o. female with medical history significant for hypertension who has been having intermittent right upper quadrant abdominal pain which radiates to her back for 3 to 4 weeks.  She has been evaluated by her GI physician, Dr. Henrene Pastor.  She had abdominal ultrasound on 11/13/2018 which showed a dilated common bile duct.  She was started on oral ciprofloxacin.  She underwent ERCP on 11/17/2018 was found to have choledocholithiasis status post ERCP with sphincterotomy and successful bile duct stone extraction.  Since her ERCP, she has had continued intermittent mild right upper quadrant pain which has been 6-7 out of 10 at its most intense with continued radiation to her back.  Pain has been well controlled with as needed Tylenol.  This morning she developed shaking chills and fever at home.  She had nausea with one episode of bilious emesis.  Due to worsening symptoms she presented to the ED for further evaluation.  She denies any associated chest pain, palpitations, dyspnea, diarrhea, constipation, or dysuria.  She takes a daily aspirin 81 mg last dose this morning 11/25/2018.  She has no known history of CAD or CVA.  ED Course:  Initial vitals showed BP 99/61, pulse 99, RR 16, temp 98.6 Fahrenheit, SPO2 98% on room air.  Labs are notable for WBC 14.5, hemoglobin 11.5, platelets 264,000, BUN 9, creatinine 1.44, AST 19, ALT 16, alk phos 160, total bilirubin 1.7, lipase 21, lactic acid 1.2.  Blood cultures were obtained in process.  SARS-CoV-2 test was negative.  CT abdomen/pelvis showed gallbladder wall thickening changes suspicious for acute cholecystitis or cholangitis.    General surgery were consulted and recommended medical admission and plan for cholecystectomy in a.m.  EDP also discussed case with [facility] who are aware of case.   Patient was given 2 L normal saline and started on IV Zosyn.  The hospitalist service was consulted to admit.  11/26/18: Patient seen and examined at her bedside.  Reports her abdominal pain is well controlled and does not have nausea at this time.  Plan for laparoscopic cholecystectomy with intraoperative cholangiogram this morning.  Patient has positive blood cultures 2 out of 2 bottles grew E. coli.  Currently on IV Zosyn.  Post surgery will switch to Rocephin 2 g daily.   Assessment/Plan: Principal Problem:   Cholangitis Active Problems:   Essential hypertension   AKI (acute kidney injury) ([place])   Acute cholecystitis or cholangitis as evidenced by CT abdomen pelvis with contrast done on 11/25/2018 Surgical intervention this morning with planned laparoscopic cholecystectomy with intraoperative cholangiogram Recommendations per general surgery Normal lipase 21 on 11/25/2018  E. coli bacteremia likely GI source Blood cultures drawn on 11/25/2018 grew E. coli 2 out of 2 bottles Currently on Zosyn Switch to Rocephin 2 g daily Repeat blood cultures x2 tomorrow Leukocytosis is trending down, vital signs stable  Hypokalemia Potassium 3.0 Repleted with KCl IV 40 mEq IV magnesium 2 g once  AKI, resolved, likely with prerenal Presented with creatinine of 1.44 with GFR of 44 Creatinine today is 0.94 with GFR greater than 60 Monitor urine output Repeat BMP in the morning  Hypertension Blood pressure is stable At home on metoprolol succinate 50 mg daily, HCTZ 12.5 mg daily, candesartan 16 mg daily Continue to hold off HCTZ and candesartan to avoid hypotension in the setting of bacteremia  GERD Continue PPI  Hyperbilirubinemia Total bilirubin trending down from 1.7-1.4 this morning  Elevated alkaline phosphatase Levels are trending down AST and ALT are normal  Chronic normocytic anemia Hemoglobin appears at baseline 11.5 with MCV 81 No sign of overt bleeding   Risks: Patient  is high risk for decompensation due to E. coli bacteremia in the setting of acute cholecystitis versus cholangitis, multiple comorbidities, and advanced age.  Patient will require at least 2 midnights for further evaluation and treatment of present condition.  DVT prophylaxis: SCDs Code Status: Full code, confirmed with patient Family Communication: Patient declined, she will communicate with family Disposition Plan: Pending surgical intervention and postop recovery Consults called: General surgery, GI consulted by EDP    Objective: Vitals:   11/25/18 1909 11/25/18 2210 11/26/18 0553 11/26/18 0945  BP: 135/71 139/78 (!) 146/85   Pulse: 76 86 99   Resp: _0 Temp:  99.7 F (37.6 C) 99.6 F (37.6 C)   TempSrc:  Oral Oral   SpO2: 100% 100% 98%   Weight:  87.5 kg  87.5 kg  Height:  _1  (1.676 m)  _2  (1.676 m)    Intake/Output Summary (Last 24 hours) at 11/26/2018 1309 Last data filed at 11/26/2018 0423 Gross per 24 hour  Intake 2656.51 ml  Output 700 ml  Net 1956.51 ml   Filed Weights   11/25/18 2210 11/26/18 0945  Weight: 87.5 kg 87.5 kg    Exam:   General: 63 y.o. year-old female well developed well nourished in no acute distress.  Alert and oriented x3.  Cardiovascular: Regular rate and rhythm with no rubs or gallops.  No thyromegaly or JVD noted.    Respiratory: Clear to auscultation with no wheezes or rales. Good inspiratory effort.  Abdomen: Mild tenderness right upper quadrant.  Hypoactive bowel sounds.  Musculoskeletal: No lower extremity edema. 2/4 pulses in all 4 extremities.  Psychiatry: Mood is appropriate for condition and setting   Data Reviewed: CBC: Recent Labs  Lab 11/25/18 1530 11/26/18 0321  WBC 14.5* 10.0  NEUTROABS 12.4*  --   HGB 11.5* 9.9*  HCT 35.4* 29.9*  MCV 81.2 80.2  PLT 264 097   Basic Metabolic Panel: Recent Labs  Lab 11/25/18 1530 11/26/18 0321  NA 138 140  K 3.5 3.0*  CL 100 104  CO2 25 25  GLUCOSE 131*  105*  BUN 9 5*  CREATININE 1.44* 0.94  CALCIUM 9.0 8.3*   GFR: Estimated Creatinine Clearance: 66.5 mL/min (by C-G formula based on SCr of 0.94 mg/dL). Liver Function Tests: Recent Labs  Lab 11/25/18 1530 11/26/18 0321  AST 19 16  ALT 16 13  ALKPHOS 160* 132*  BILITOT 1.7* 1.4*  PROT 7.5 6.1*  ALBUMIN 3.1* 2.6*   Recent Labs  Lab 11/25/18 1530  LIPASE 21   No results for input(s): AMMONIA in the last 168 hours. Coagulation Profile: No results for input(s): INR, PROTIME in the last 168 hours. Cardiac Enzymes: No results for input(s): CKTOTAL, CKMB, CKMBINDEX, TROPONINI in the last 168 hours. BNP (last 3 results) No results for input(s): PROBNP in the last 8760 hours. HbA1C: No results for input(s): HGBA1C in the last 72 hours. CBG: No results for input(s): GLUCAP in the last 168 hours. Lipid Profile: No results for input(s): CHOL, HDL, LDLCALC, TRIG, CHOLHDL, LDLDIRECT in the last 72 hours. Thyroid Function Tests: No results for input(s): TSH, T4TOTAL, FREET4, T3FREE, THYROIDAB in the last 72 hours. Anemia Panel: No results for input(s): VITAMINB12, FOLATE,  FERRITIN, TIBC, IRON, RETICCTPCT in the last 72 hours. Urine analysis:    Component Value Date/Time   COLORURINE YELLOW 11/26/2018 0050   APPEARANCEUR CLEAR 11/26/2018 0050   LABSPEC 1.030 11/26/2018 0050   PHURINE 5.0 11/26/2018 0050   GLUCOSEU NEGATIVE 11/26/2018 0050   GLUCOSEU NEGATIVE 10/17/2014 0909   HGBUR NEGATIVE 11/26/2018 0050   BILIRUBINUR NEGATIVE 11/26/2018 0050   KETONESUR 5 (A) 11/26/2018 0050   PROTEINUR NEGATIVE 11/26/2018 0050   UROBILINOGEN 0.2 10/17/2014 0909   NITRITE NEGATIVE 11/26/2018 0050   LEUKOCYTESUR LARGE (A) 11/26/2018 0050   Sepsis Labs: _0 (procalcitonin:4,lacticidven:4)  ) Recent Results (from the past 240 hour(s))  Culture, blood (routine x 2)     Status: None (Preliminary result)   Collection Time: 11/25/18  3:30 PM  Result Value Ref Range Status   Specimen  Description BLOOD RIGHT ARM  Final   Special Requests   Final    BOTTLES DRAWN AEROBIC AND ANAEROBIC Blood Culture results may not be optimal due to an inadequate volume of blood received in culture bottles   Culture  Setup Time   Final    GRAM NEGATIVE RODS IN BOTH AEROBIC AND ANAEROBIC BOTTLES CRITICAL VALUE NOTED.  VALUE IS CONSISTENT WITH PREVIOUSLY REPORTED AND CALLED VALUE. Performed at [facility], [place] [address], [place], [place] [address]    Culture PENDING  Incomplete   Report Status PENDING  Incomplete  Culture, blood (routine x 2)     Status: None (Preliminary result)   Collection Time: 11/25/18  3:30 PM  Result Value Ref Range Status   Specimen Description BLOOD LEFT ARM  Final   Special Requests   Final    BOTTLES DRAWN AEROBIC AND ANAEROBIC Blood Culture results may not be optimal due to an inadequate volume of blood received in culture bottles   Culture  Setup Time   Final    IN BOTH AEROBIC AND ANAEROBIC BOTTLES GRAM NEGATIVE RODS CRITICAL RESULT CALLED TO, READ BACK BY AND VERIFIED WITH: [facility] [place] [id number] FCP Performed at [facility], [place] [address], [place], [place] [address]    Culture GRAM NEGATIVE RODS  Final   Report Status PENDING  Incomplete  Blood Culture ID Panel (Reflexed)     Status: Abnormal   Collection Time: 11/25/18  3:30 PM  Result Value Ref Range Status   Enterococcus species NOT DETECTED NOT DETECTED Final   Listeria monocytogenes NOT DETECTED NOT DETECTED Final   Staphylococcus species NOT DETECTED NOT DETECTED Final   Staphylococcus aureus (BCID) NOT DETECTED NOT DETECTED Final   Streptococcus species NOT DETECTED NOT DETECTED Final   Streptococcus agalactiae NOT DETECTED NOT DETECTED Final   Streptococcus pneumoniae NOT DETECTED NOT DETECTED Final   Streptococcus pyogenes NOT DETECTED NOT DETECTED Final   Acinetobacter baumannii NOT DETECTED NOT DETECTED Final   Enterobacteriaceae species DETECTED (A) NOT DETECTED Final     Comment: Enterobacteriaceae represent a large family of gram-negative bacteria, not a single organism. CRITICAL RESULT CALLED TO, READ BACK BY AND VERIFIED WITH: PHARMD MACCIA [id number] FCP    Enterobacter cloacae complex NOT DETECTED NOT DETECTED Final   Escherichia coli DETECTED (A) NOT DETECTED Final    Comment: CRITICAL RESULT CALLED TO, READ BACK BY AND VERIFIED WITH: PHARMD MACCIA [id number] FCP    Klebsiella oxytoca NOT DETECTED NOT DETECTED Final   Klebsiella pneumoniae NOT DETECTED NOT DETECTED Final   Proteus species NOT DETECTED NOT DETECTED Final   Serratia marcescens NOT DETECTED  NOT DETECTED Final   Carbapenem resistance NOT DETECTED NOT DETECTED Final   Haemophilus influenzae NOT DETECTED NOT DETECTED Final   Neisseria meningitidis NOT DETECTED NOT DETECTED Final   Pseudomonas aeruginosa NOT DETECTED NOT DETECTED Final   Candida albicans NOT DETECTED NOT DETECTED Final   Candida glabrata NOT DETECTED NOT DETECTED Final   Candida krusei NOT DETECTED NOT DETECTED Final   Candida parapsilosis NOT DETECTED NOT DETECTED Final   Candida tropicalis NOT DETECTED NOT DETECTED Final    Comment: Performed at [facility], [place] [address], [place], [place] [address]  SARS Coronavirus 2 (CEPHEID - Performed in [facility] hospital lab), Hosp Order     Status: None   Collection Time: 11/25/18  4:50 PM  Result Value Ref Range Status   SARS Coronavirus 2 NEGATIVE NEGATIVE Final    Comment: (NOTE) If result is NEGATIVE SARS-CoV-2 target nucleic acids are NOT DETECTED. The SARS-CoV-2 RNA is generally detectable in upper and lower  respiratory specimens during the acute phase of infection. The lowest  concentration of SARS-CoV-2 viral copies this assay can detect is 250  copies / mL. A negative result does not preclude SARS-CoV-2 infection  and should not be used as the sole basis for treatment or other  patient management decisions.  A negative result may occur with    improper specimen collection / handling, submission of specimen other  than nasopharyngeal swab, presence of viral mutation(s) within the  areas targeted by this assay, and inadequate number of viral copies  (<250 copies / mL). A negative result must be combined with clinical  observations, patient history, and epidemiological information. If result is POSITIVE SARS-CoV-2 target nucleic acids are DETECTED. The SARS-CoV-2 RNA is generally detectable in upper and lower  respiratory specimens dur ing the acute phase of infection.  Positive  results are indicative of active infection with SARS-CoV-2.  Clinical  correlation with patient history and other diagnostic information is  necessary to determine patient infection status.  Positive results do  not rule out bacterial infection or co-infection with other viruses. If result is PRESUMPTIVE POSTIVE SARS-CoV-2 nucleic acids MAY BE PRESENT.   A presumptive positive result was obtained on the submitted specimen  and confirmed on repeat testing.  While 2019 novel coronavirus  (SARS-CoV-2) nucleic acids may be present in the submitted sample  additional confirmatory testing may be necessary for epidemiological  and / or clinical management purposes  to differentiate between  SARS-CoV-2 and other Sarbecovirus currently known to infect humans.  If clinically indicated additional testing with an alternate test  methodology [phone]) is advised. The SARS-CoV-2 RNA is generally  detectable in upper and lower respiratory sp ecimens during the acute  phase of infection. The expected result is Negative. Fact Sheet for Patients:  StrictlyIdeas.no Fact Sheet for Healthcare Providers: BankingDealers.co.za This test is not yet approved or cleared by the [place] FDA and has been authorized for detection and/or diagnosis of SARS-CoV-2 by FDA under an Emergency Use Authorization (EUA).  This EUA will  remain in effect (meaning this test can be used) for the duration of the COVID-19 declaration under Section 564(b)(1) of the Act, 21 U.S.C. section 360bbb-3(b)(1), unless the authorization is terminated or revoked sooner. Performed at [facility], [place] [address], [place], [place] [address]   Surgical pcr screen     Status: None   Collection Time: 11/26/18 12:26 AM  Result Value Ref Range Status   MRSA, PCR NEGATIVE NEGATIVE Final  Staphylococcus aureus NEGATIVE NEGATIVE Final    Comment: (NOTE) The Xpert SA Assay (FDA approved for NASAL specimens in patients 13 years of age and older), is one component of a comprehensive surveillance program. It is not intended to diagnose infection nor to guide or monitor treatment. Performed at [facility], [place] [address] [place],  [address]       Studies: Ct Abdomen Pelvis W Contrast  Result Date: 11/25/2018 CLINICAL DATA:  Right upper quadrant pain. ERCP Nov 17, 2018. Nausea for several days. Fever last night. Vomiting this morning. EXAM: CT ABDOMEN AND PELVIS WITH CONTRAST TECHNIQUE: Multidetector CT imaging of the abdomen and pelvis was performed using the standard protocol following bolus administration of intravenous contrast. CONTRAST:  47m OMNIPAQUE IOHEXOL 300 MG/ML  SOLN COMPARISON:  Ultrasound Nov 13, 2018 FINDINGS: Lower chest: There is a small hiatal hernia. The lung bases and lower chest are otherwise normal. Hepatobiliary: No liver masses are identified. The gallbladder is somewhat thick walled. There is fat stranding adjacent to the gallbladder. There is also a small amount of fluid in the right upper quadrant, extending inferior to the gallbladder. There is air in the bile ducts consistent with recent ERCP and sphincterotomy. Intra and extrahepatic biliary duct prominence remains. Portal vein is patent. Pancreas: The fat stranding in fluid in the right upper quadrant abuts the pancreatic head. However, the  pancreas itself is grossly unremarkable. Spleen: Normal in size without focal abnormality. Adrenals/Urinary Tract: Adrenal glands are normal. The kidneys are unremarkable. No hydronephrosis or perinephric stranding. The ureters are normal. The bladder is unremarkable. Stomach/Bowel: There is a small hiatal hernia. The stomach is otherwise normal. The duodenum is unremarkable. The remainder of the small bowel is normal. Colonic diverticulosis is seen without diverticulitis. Visualized appendix is normal. Vascular/Lymphatic: Atherosclerotic changes are seen in the tortuous nonaneurysmal aorta. No adenopathy. Reproductive: Status post hysterectomy. No adnexal masses. Other: No abdominal wall hernia or abnormality. No abdominopelvic ascites. Musculoskeletal: Scoliotic curvature lumbar spine, apex to the right. Degenerative changes in the thoracolumbar spine including the facets. IMPRESSION: 1. There is mild gallbladder wall thickening. There is fat stranding and fluid in the right upper quadrant abutting the duodenum, gallbladder, and pancreatic head. Findings are not specific but concerning for acute cholecystitis or cholangitis. The patient has a normal lipase today making pancreatitis unlikely. The duodenum demonstrates no definitive wall thickening making duodenitis less likely. Electronically Signed   By: DDorise BullionIII M.D   On: 11/25/2018 18:18    Scheduled Meds:  [[UORHold] metoprolol succinate  75 mg Oral Daily   [MAR Hold] pantoprazole  40 mg Oral Daily    Continuous Infusions:  [MAR Hold] piperacillin-tazobactam (ZOSYN)  IV 3.375 g (11/26/18 0553)     LOS: 1 day     CKayleen Memos MD [facility] Pager [phone] If 7PM-7AM, please contact night-coverage [email or web address] Password [email or web address] 11/26/2018, 1:09 PM

## 2018-11-26 NOTE — Anesthesia Postprocedure Evaluation (Signed)
Anesthesia Post Note  Patient: Breya Cass  Procedure(s) Performed: LAPAROSCOPIC CHOLECYSTECTOMY WITH INTRAOPERATIVE CHOLANGIOGRAM (N/A Abdomen)     Patient location during evaluation: PACU Anesthesia Type: General Level of consciousness: awake and alert Pain management: pain level controlled Vital Signs Assessment: post-procedure vital signs reviewed and stable Respiratory status: spontaneous breathing, nonlabored ventilation, respiratory function stable and patient connected to nasal cannula oxygen Cardiovascular status: blood pressure returned to baseline and stable Postop Assessment: no apparent nausea or vomiting Anesthetic complications: no    Last Vitals:  Vitals:   11/26/18 1545 11/26/18 1628  BP: 140/76 127/75  Pulse: 69 72  Resp: 20 16  Temp: 36.8 C 37.1 C  SpO2: 98% 98%    Last Pain:  Vitals:   11/26/18 1628  TempSrc: Oral  PainSc:                  Karyl Kinnier Kayton Ripp

## 2018-11-26 NOTE — Progress Notes (Signed)
PHARMACY - PHYSICIAN COMMUNICATION CRITICAL VALUE ALERT - BLOOD CULTURE IDENTIFICATION (BCID)  Janice Rodriguez is an 63 y.o. female who presented to [facility] on 11/25/2018 with a chief complaint of abdominal pain  Assessment:  2/2 blood cx w ecoli; pending lap chole today  Name of physician (or Provider) Contacted: Dr Nevada Crane  Current antibiotics: Zosyn  Changes to prescribed antibiotics recommended:  Continue zosyn for today Consider de-escalation to Ceftriaxone Mon after surgery  Results for orders placed or performed during the hospital encounter of 11/25/18  Blood Culture ID Panel (Reflexed) (Collected: 11/25/2018  3:30 PM)  Result Value Ref Range   Enterococcus species NOT DETECTED NOT DETECTED   Listeria monocytogenes NOT DETECTED NOT DETECTED   Staphylococcus species NOT DETECTED NOT DETECTED   Staphylococcus aureus (BCID) NOT DETECTED NOT DETECTED   Streptococcus species NOT DETECTED NOT DETECTED   Streptococcus agalactiae NOT DETECTED NOT DETECTED   Streptococcus pneumoniae NOT DETECTED NOT DETECTED   Streptococcus pyogenes NOT DETECTED NOT DETECTED   Acinetobacter baumannii NOT DETECTED NOT DETECTED   Enterobacteriaceae species DETECTED (A) NOT DETECTED   Enterobacter cloacae complex NOT DETECTED NOT DETECTED   Escherichia coli DETECTED (A) NOT DETECTED   Klebsiella oxytoca NOT DETECTED NOT DETECTED   Klebsiella pneumoniae NOT DETECTED NOT DETECTED   Proteus species NOT DETECTED NOT DETECTED   Serratia marcescens NOT DETECTED NOT DETECTED   Carbapenem resistance NOT DETECTED NOT DETECTED   Haemophilus influenzae NOT DETECTED NOT DETECTED   Neisseria meningitidis NOT DETECTED NOT DETECTED   Pseudomonas aeruginosa NOT DETECTED NOT DETECTED   Candida albicans NOT DETECTED NOT DETECTED   Candida glabrata NOT DETECTED NOT DETECTED   Candida krusei NOT DETECTED NOT DETECTED   Candida parapsilosis NOT DETECTED NOT DETECTED   Candida tropicalis NOT DETECTED NOT DETECTED    Levester Fresh, PharmD, BCPS, BCCCP Clinical Pharmacist [phone]  Please check AMION for all [place] numbers  11/26/2018 7:30 AM

## 2018-11-26 NOTE — Anesthesia Preprocedure Evaluation (Addendum)
Anesthesia Evaluation  Patient identified by MRN, date of birth, ID band Patient awake    Reviewed: Allergy & Precautions, NPO status , Patient's Chart, lab work & pertinent test results  Airway Mallampati: II  TM Distance: >3 FB Neck ROM: Full    Dental no notable dental hx.    Pulmonary neg pulmonary ROS,    Pulmonary exam normal breath sounds clear to auscultation       Cardiovascular hypertension, Pt. on medications and Pt. on home beta blockers Normal cardiovascular exam Rhythm:Regular Rate:Normal  ECG: NSR, rate 71   Neuro/Psych negative neurological ROS  negative psych ROS   GI/Hepatic Neg liver ROS, GERD  Medicated and Controlled,  Endo/Other  negative endocrine ROS  Renal/GU negative Renal ROS     Musculoskeletal SCOLIOSIS   Abdominal (+) + obese,   Peds  Hematology  (+) anemia , HLD   Anesthesia Other Findings CHOLECYSTITIS  Reproductive/Obstetrics                            Anesthesia Physical Anesthesia Plan  ASA: II  Anesthesia Plan: General   Post-op Pain Management:    Induction: Intravenous  PONV Risk Score and Plan: 4 or greater and Midazolam, Dexamethasone, Ondansetron and Treatment may vary due to age or medical condition  Airway Management Planned: Oral ETT  Additional Equipment:   Intra-op Plan:   Post-operative Plan: Extubation in OR  Informed Consent: I have reviewed the patients History and Physical, chart, labs and discussed the procedure including the risks, benefits and alternatives for the proposed anesthesia with the patient or authorized representative who has indicated his/her understanding and acceptance.     Dental advisory given  Plan Discussed with: CRNA  Anesthesia Plan Comments:        Anesthesia Quick Evaluation

## 2018-11-26 NOTE — Transfer of Care (Signed)
Immediate Anesthesia Transfer of Care Note  Patient: Janice Rodriguez  Procedure(s) Performed: LAPAROSCOPIC CHOLECYSTECTOMY WITH INTRAOPERATIVE CHOLANGIOGRAM (N/A Abdomen)  Patient Location: PACU  Anesthesia Type:General  Level of Consciousness: awake, alert  and oriented  Airway & Oxygen Therapy: Patient Spontanous Breathing  Post-op Assessment: Report given to RN and Post -op Vital signs reviewed and stable  Post vital signs: Reviewed and stable  Last Vitals:  Vitals Value Taken Time  BP 131/79 11/26/2018  2:57 PM  Temp    Pulse 73 11/26/2018  3:00 PM  Resp 18 11/26/2018  3:00 PM  SpO2 98 % 11/26/2018  3:00 PM  Vitals shown include unvalidated device data.  Last Pain:  Vitals:   11/26/18 0820  TempSrc:   PainSc: 4          Complications: No apparent anesthesia complications

## 2018-11-26 NOTE — Op Note (Signed)
Preoperative diagnosis:history choledocholithiasis, cholecystitis Postoperative diagnosis: gangrenous cholecystitis Procedure: Laparoscopic fenestrating subtotal cholecystectomy Surgeon: Dr. Serita Grammes Asst Dr Neysa Bonito Anesthesia: General Estimated blood loss:  Drains: None Specimens: Gallbladder to pathology Complications: None Sponge and needle counts correct at completion Disposition to recovery stable condition  Indications: This is a89 yof who has had choledocholithiasis.  She is s/p ercp. she was admitted with what appears to be cholecystitis and cholangitis.  We discussed proceeding with lap chole.   Procedure: After informed consent was obtained the patient was taken the operatingroom. She was onantibiotics.SCDs were in place. She was placed under general anesthesia without complication. Herabdomen was prepped and draped in the standard sterile surgical fashion. A surgical timeout was then performed.  Iinfiltrated Marcaine below herumbilicus. I made a vertical incision. I grasped the fascia with a Kocherclamp. I incised the fascia and entered into the peritoneum bluntly. This was done without injury. I placed a 0 Vicryl pursestring suture and inserted a Hassan trocar. The abdomen was insufflatedto8mmHg pressure. I then inserted 3 further 5 mm trocars in the epigastrium and right side of the abdomen under direct vision without complication. The gallbladder was tense. I did aspirate it.  She had purulence in her gallbladder.  The gallbladder was necrotic. I then retracted it cephalad and lateral.we were unable to dissect the triangle of Calot. I could not identify the critical view of safety. I then began to take the gallbladder down from the top until it was safe.  I eventually was able to see what I believed to be a large short cystic duct. I did identify the artery and clipped it. I elected to remove most of the gallbladder and do a fenestrating lap  chole. I elected not to close this small stump as he has already had ercp and sphincterotomy. It was placed in a retrieval bag.  I then was able to obtain hemostasis. I irrigated and this was clear. I then placed a 19 Fr Blake drain in the ruq. I then desufflated the abdomen and removed thetrocars. Once this had been completed I removed all the trocars and the gallbladder and the retrieval bag were all removed as well. I then tied down my pursestring suture. I placed an additional 0 Vicrylin theumbilical incision. I then closed these with 4-0 Monocryl and glue.She tolerated this well was extubated and transferred to recovery stable.

## 2018-11-26 NOTE — Interval H&P Note (Signed)
History and Physical Interval Note:  11/26/2018 11:15 AM I have seen and examined patient. We discussed lap chole with possible ioc today. Risks and benefits discussed.  Janice Rodriguez  has presented today for surgery, with the diagnosis of CHOLECYSTITIS.  The various methods of treatment have been discussed with the patient and family. After consideration of risks, benefits and other options for treatment, the patient has consented to  Procedure(s): LAPAROSCOPIC CHOLECYSTECTOMY WITH INTRAOPERATIVE CHOLANGIOGRAM (N/A) as a surgical intervention.  The patient's history has been reviewed, patient examined, no change in status, stable for surgery.  I have reviewed the patient's chart and labs.  Questions were answered to the patient's satisfaction.     Rolm Bookbinder

## 2018-11-26 NOTE — Anesthesia Procedure Notes (Signed)
Procedure Name: Intubation Date/Time: 11/26/2018 1:15 PM Performed by: Clearnce Sorrel, CRNA Pre-anesthesia Checklist: Patient identified, Emergency Drugs available, Suction available, Patient being monitored and Timeout performed Patient Re-evaluated:Patient Re-evaluated prior to induction Oxygen Delivery Method: Circle system utilized Preoxygenation: Pre-oxygenation with 100% oxygen Induction Type: IV induction Ventilation: Mask ventilation without difficulty Laryngoscope Size: Mac and 3 Grade View: Grade II Tube type: Oral Tube size: 7.0 mm Number of attempts: 1 Airway Equipment and Method: Stylet Placement Confirmation: ETT inserted through vocal cords under direct vision,  positive ETCO2 and breath sounds checked- equal and bilateral Secured at: 22 cm Tube secured with: Tape Dental Injury: Teeth and Oropharynx as per pre-operative assessment

## 2018-11-27 ENCOUNTER — Encounter (HOSPITAL_COMMUNITY): Payer: Self-pay | Admitting: General Surgery

## 2018-11-27 ENCOUNTER — Other Ambulatory Visit: Payer: Self-pay

## 2018-11-27 LAB — CBC WITH DIFFERENTIAL/PLATELET
Abs Immature Granulocytes: 0.05 10*3/uL (ref 0.00–0.07)
Basophils Absolute: 0 10*3/uL (ref 0.0–0.1)
Basophils Relative: 0 %
Eosinophils Absolute: 0 10*3/uL (ref 0.0–0.5)
Eosinophils Relative: 0 %
HCT: 27.7 % — ABNORMAL LOW (ref 36.0–46.0)
Hemoglobin: 9 g/dL — ABNORMAL LOW (ref 12.0–15.0)
Immature Granulocytes: 0 %
Lymphocytes Relative: 10 %
Lymphs Abs: 1.2 10*3/uL (ref 0.7–4.0)
MCH: 26.3 pg (ref 26.0–34.0)
MCHC: 32.5 g/dL (ref 30.0–36.0)
MCV: 81 fL (ref 80.0–100.0)
Monocytes Absolute: 0.6 10*3/uL (ref 0.1–1.0)
Monocytes Relative: 5 %
Neutro Abs: 10.5 10*3/uL — ABNORMAL HIGH (ref 1.7–7.7)
Neutrophils Relative %: 85 %
Platelets: 208 10*3/uL (ref 150–400)
RBC: 3.42 MIL/uL — ABNORMAL LOW (ref 3.87–5.11)
RDW: 16.5 % — ABNORMAL HIGH (ref 11.5–15.5)
WBC: 12.3 10*3/uL — ABNORMAL HIGH (ref 4.0–10.5)
nRBC: 0 % (ref 0.0–0.2)

## 2018-11-27 LAB — COMPREHENSIVE METABOLIC PANEL
ALT: 28 U/L (ref 0–44)
AST: 44 U/L — ABNORMAL HIGH (ref 15–41)
Albumin: 2.4 g/dL — ABNORMAL LOW (ref 3.5–5.0)
Alkaline Phosphatase: 113 U/L (ref 38–126)
Anion gap: 12 (ref 5–15)
BUN: 5 mg/dL — ABNORMAL LOW (ref 8–23)
CO2: 24 mmol/L (ref 22–32)
Calcium: 8.4 mg/dL — ABNORMAL LOW (ref 8.9–10.3)
Chloride: 101 mmol/L (ref 98–111)
Creatinine, Ser: 0.88 mg/dL (ref 0.44–1.00)
GFR calc Af Amer: >60 mL/min (ref >60)
GFR calc non Af Amer: >60 mL/min (ref >60)
Glucose, Bld: 175 mg/dL — ABNORMAL HIGH (ref 70–99)
Potassium: 3.6 mmol/L (ref 3.5–5.1)
Sodium: 137 mmol/L (ref 135–145)
Total Bilirubin: 0.8 mg/dL (ref 0.3–1.2)
Total Protein: 6.1 g/dL — ABNORMAL LOW (ref 6.5–8.1)

## 2018-11-27 MED ORDER — VITAMIN D 25 MCG (1000 UNIT) PO TABS
1000.0000 [IU] | ORAL_TABLET | Freq: Every day | ORAL | Status: DC
  Administered 2018-11-27 – 2018-12-03 (×6): 1000 [IU] via ORAL
  Filled 2018-11-27 (×6): qty 1

## 2018-11-27 MED ORDER — IRBESARTAN 150 MG PO TABS
150.0000 mg | ORAL_TABLET | Freq: Every day | ORAL | Status: DC
  Administered 2018-11-27 – 2018-12-03 (×6): 150 mg via ORAL
  Filled 2018-11-27 (×7): qty 1

## 2018-11-27 MED ORDER — ENOXAPARIN SODIUM 40 MG/0.4ML ~~LOC~~ SOLN
40.0000 mg | SUBCUTANEOUS | Status: DC
  Administered 2018-11-28: 40 mg via SUBCUTANEOUS
  Filled 2018-11-27 (×2): qty 0.4

## 2018-11-27 MED ORDER — VITAMIN C 500 MG PO TABS
500.0000 mg | ORAL_TABLET | Freq: Every day | ORAL | Status: DC
  Administered 2018-11-27 – 2018-12-03 (×6): 500 mg via ORAL
  Filled 2018-11-27 (×6): qty 1

## 2018-11-27 MED ORDER — HYDRALAZINE HCL 20 MG/ML IJ SOLN: 10.0000 mg | Freq: Four times a day (QID) | INTRAMUSCULAR | Status: DC | PRN

## 2018-11-27 MED ORDER — IRBESARTAN 150 MG PO TABS: 150.0000 mg | ORAL_TABLET | Freq: Every day | ORAL | Status: DC

## 2018-11-27 MED ORDER — HYDROCHLOROTHIAZIDE 12.5 MG PO CAPS
12.5000 mg | ORAL_CAPSULE | Freq: Every morning | ORAL | Status: DC
  Administered 2018-11-27 – 2018-12-03 (×7): 12.5 mg via ORAL
  Filled 2018-11-27 (×7): qty 1

## 2018-11-27 MED ORDER — SODIUM CHLORIDE 0.9 % IV SOLN
2.0000 g | INTRAVENOUS | Status: DC
  Administered 2018-11-27 – 2018-12-01 (×5): 2 g via INTRAVENOUS
  Filled 2018-11-27 (×6): qty 20

## 2018-11-27 NOTE — Progress Notes (Signed)
Pt wanted to read about Avapro 150mg  before taking medication for her blood pressure. Called Pharmacy and pharmacy sent up education for patient to read.

## 2018-11-27 NOTE — Progress Notes (Signed)
PROGRESS NOTE  Janice Rodriguez UEA:[id number] DOB: Jun 19, 1956 DOA: 11/25/2018 PCP: [name], DO  HPI/Recap of past 24 hours: Janice Rodriguez is a 63 y.o. female with medical history significant for hypertension who has been having intermittent right upper quadrant abdominal pain which radiates to her back for 3 to 4 weeks.  She has been evaluated by her GI physician, Dr. Henrene Pastor.  She had abdominal ultrasound on 11/13/2018 which showed a dilated common bile duct.  She was started on oral ciprofloxacin.  She underwent ERCP on 11/17/2018 was found to have choledocholithiasis status post ERCP with sphincterotomy and successful bile duct stone extraction.  Since her ERCP, she has had continued intermittent mild right upper quadrant pain which has been 6-7 out of 10 at its most intense with continued radiation to her back.  Pain has been well controlled with as needed Tylenol.  This morning she developed shaking chills and fever at home.  She had nausea with one episode of bilious emesis.  Due to worsening symptoms she presented to the ED for further evaluation.  She denies any associated chest pain, palpitations, dyspnea, diarrhea, constipation, or dysuria.  She takes a daily aspirin 81 mg last dose this morning 11/25/2018.  She has no known history of CAD or CVA.  ED Course:  Initial vitals showed BP 99/61, pulse 99, RR 16, temp 98.6 Fahrenheit, SPO2 98% on room air.  Labs are notable for WBC 14.5, hemoglobin 11.5, platelets 264,000, BUN 9, creatinine 1.44, AST 19, ALT 16, alk phos 160, total bilirubin 1.7, lipase 21, lactic acid 1.2.  Blood cultures were obtained in process.  SARS-CoV-2 test was negative.  CT abdomen/pelvis showed gallbladder wall thickening changes suspicious for acute cholecystitis or cholangitis.    General surgery were consulted and recommended medical admission and plan for cholecystectomy in a.m.  EDP also discussed case with  GI who are aware of case.   Patient was given 2 L normal saline and started on IV Zosyn.  The hospitalist service was consulted to admit.  11/26/18: Patient seen and examined at her bedside.  Reports her abdominal pain is well controlled and does not have nausea at this time.  Plan for laparoscopic cholecystectomy with intraoperative cholangiogram this morning.  Patient has positive blood cultures 2 out of 2 bottles grew E. coli.  Currently on IV Zosyn.  Post surgery will switch to Rocephin 2 g daily.  11/27/18: She was seen and examined at her bedside.  No acute events overnight.  Hypotensive early this morning.  Her blood pressure medications have been resumed.  We will continue to monitor closely.  POD #1 post lap chole by Dr. Donne Hazel on 11/26/2018.  Reports pain is well controlled.  Denies flatus this morning.  Has been ambulating without difficulty.   Assessment/Plan: Principal Problem:   Cholangitis Active Problems:   Essential hypertension   AKI (acute kidney injury) ([place])   Acute calculus cholecystitis or cholangitis as evidenced by CT abdomen pelvis with contrast done on 11/25/2018 status post ERCP/sphincterotomy for choledocholithiasis on 11/17/2018 by Dr. Henrene Pastor Continue recommendations per general surgery Normal lipase 21 on 11/25/2018 Continue antibiotics for this 5 days as recommended by general surgery Right upper quadrant drain in place Continue incentive spirometer and ambulate as tolerated General surgery following  E. coli bacteremia likely GI source Blood cultures drawn on 11/25/2018 grew E. coli 2 out of 2 bottles Currently on Zosyn Switch to Rocephin 2 g daily Repeat blood cultures x2 done on 11/27/2018 in process Afebrile  Leukocytosis, at this point suspect reactive post surgery Mild increase from 10K to 12 K Afebrile We will continue to monitor  Hypokalemia, resolved post repletion  AKI, resolved, likely with prerenal Presented with creatinine of 1.44 with GFR of 44 Creatinine improved  0.94 with GFR greater than 60 Continue to monitor urine output Repeat BMP in the morning  Hypertension Blood pressure is stable At home on metoprolol succinate 50 mg daily, HCTZ 12.5 mg daily, candesartan 16 mg daily Resume all antihypertensive home medications  GERD Continue PPI  Hyperbilirubinemia Total bilirubin trending down from 1.7-1.4 this morning  Elevated alkaline phosphatase, resolved  Elevated AST post surgery Level 44 from 16  Chronic normocytic anemia No sign of overt bleeding Hemoglobin stable post surgery 9.0 from 9.9   Risks: Patient is high risk for decompensation due to E. coli bacteremia in the setting of acute cholecystitis versus cholangitis, multiple comorbidities, and advanced age.  Patient will require at least 2 midnights for further evaluation and treatment of present condition.  DVT prophylaxis: SCDs/Lovenox subcu daily Code Status: Full code, confirmed with patient Family Communication: Patient declined, she will communicate with family Disposition Plan:  Will discharge to home once surgery signs off. Consults called: General surgery, GI consulted by EDP    Objective: Vitals:   11/26/18 2001 11/26/18 2352 11/27/18 0450 11/27/18 0818  BP: (!) 143/85 (!) 141/77 (!) 127/102 (!) 151/86  Pulse: 80 75 81 72  Resp: 16 16 14    Temp: 98.4 F (36.9 C) 98 F (36.7 C) 98.7 F (37.1 C)   TempSrc: Oral Oral Oral   SpO2: 96% 96% 98%   Weight:      Height:        Intake/Output Summary (Last 24 hours) at 11/27/2018 1252 Last data filed at 11/27/2018 0900 Gross per 24 hour  Intake 890 ml  Output 205 ml  Net 685 ml   Filed Weights   11/25/18 2210 11/26/18 0945  Weight: 87.5 kg 87.5 kg    Exam:  . General: 63 y.o. year-old female well developed well nourished in no acute distress. Alert and oriented x3. . Cardiovascular: Regular rate and rhythm with no rubs or gallops.  No JVD or thyromegaly noted. Marland Kitchen Respiratory: Clear to auscultation with no  wheezes or rales.  Good inspiratory effort.   . Abdomen: Right upper quadrant drain in place.  Nontender.  Hypoactive bowel sounds. . Musculoskeletal: No lower extremity edema.  2 out of 4 pulses in all 4 extremities. Marland Kitchen Psychiatry: Mood is appropriate for condition and setting.  Data Reviewed: CBC: Recent Labs  Lab 11/25/18 1530 11/26/18 0321 11/27/18 0440  WBC 14.5* 10.0 12.3*  NEUTROABS 12.4*  --  10.5*  HGB 11.5* 9.9* 9.0*  HCT 35.4* 29.9* 27.7*  MCV 81.2 80.2 81.0  PLT 264 220 552   Basic Metabolic Panel: Recent Labs  Lab 11/25/18 1530 11/26/18 0321 11/27/18 0440  NA 138 140 137  K 3.5 3.0* 3.6  CL 100 104 101  CO2 25 25 24   GLUCOSE 131* 105* 175*  BUN 9 5* 5*  CREATININE 1.44* 0.94 0.88  CALCIUM 9.0 8.3* 8.4*   GFR: Estimated Creatinine Clearance: 71 mL/min (by C-G formula based on SCr of 0.88 mg/dL). Liver Function Tests: Recent Labs  Lab 11/25/18 1530 11/26/18 0321 11/27/18 0440  AST 19 16 44*  ALT 16 13 28   ALKPHOS 160* 132* 113  BILITOT 1.7* 1.4* 0.8  PROT 7.5 6.1* 6.1*  ALBUMIN 3.1* 2.6* 2.4*   Recent  Labs  Lab 11/25/18 1530  LIPASE 21   No results for input(s): AMMONIA in the last 168 hours. Coagulation Profile: No results for input(s): INR, PROTIME in the last 168 hours. Cardiac Enzymes: No results for input(s): CKTOTAL, CKMB, CKMBINDEX, TROPONINI in the last 168 hours. BNP (last 3 results) No results for input(s): PROBNP in the last 8760 hours. HbA1C: No results for input(s): HGBA1C in the last 72 hours. CBG: No results for input(s): GLUCAP in the last 168 hours. Lipid Profile: No results for input(s): CHOL, HDL, LDLCALC, TRIG, CHOLHDL, LDLDIRECT in the last 72 hours. Thyroid Function Tests: No results for input(s): TSH, T4TOTAL, FREET4, T3FREE, THYROIDAB in the last 72 hours. Anemia Panel: No results for input(s): VITAMINB12, FOLATE, FERRITIN, TIBC, IRON, RETICCTPCT in the last 72 hours. Urine analysis:    Component Value  Date/Time   COLORURINE YELLOW 11/26/2018 0050   APPEARANCEUR CLEAR 11/26/2018 0050   LABSPEC 1.030 11/26/2018 0050   PHURINE 5.0 11/26/2018 0050   GLUCOSEU NEGATIVE 11/26/2018 0050   GLUCOSEU NEGATIVE 10/17/2014 0909   HGBUR NEGATIVE 11/26/2018 0050   BILIRUBINUR NEGATIVE 11/26/2018 0050   KETONESUR 5 (A) 11/26/2018 0050   PROTEINUR NEGATIVE 11/26/2018 0050   UROBILINOGEN 0.2 10/17/2014 0909   NITRITE NEGATIVE 11/26/2018 0050   LEUKOCYTESUR LARGE (A) 11/26/2018 0050   Sepsis Labs: @LABRCNTIP (procalcitonin:4,lacticidven:4)  ) Recent Results (from the past 240 hour(s))  Culture, blood (routine x 2)     Status: None (Preliminary result)   Collection Time: 11/25/18  3:30 PM  Result Value Ref Range Status   Specimen Description BLOOD RIGHT ARM  Final   Special Requests   Final    BOTTLES DRAWN AEROBIC AND ANAEROBIC Blood Culture results may not be optimal due to an inadequate volume of blood received in culture bottles   Culture  Setup Time   Final    GRAM NEGATIVE RODS IN Rodriguez AEROBIC AND ANAEROBIC BOTTLES CRITICAL VALUE NOTED.  VALUE IS CONSISTENT WITH PREVIOUSLY REPORTED AND CALLED VALUE.    Culture   Final    GRAM NEGATIVE RODS IDENTIFICATION TO FOLLOW Performed at [facility], [place] [address], [place], [place] [address]    Report Status PENDING  Incomplete  Culture, blood (routine x 2)     Status: Abnormal (Preliminary result)   Collection Time: 11/25/18  3:30 PM  Result Value Ref Range Status   Specimen Description BLOOD LEFT ARM  Final   Special Requests   Final    BOTTLES DRAWN AEROBIC AND ANAEROBIC Blood Culture results may not be optimal due to an inadequate volume of blood received in culture bottles   Culture  Setup Time   Final    IN Rodriguez AEROBIC AND ANAEROBIC BOTTLES GRAM NEGATIVE RODS CRITICAL RESULT CALLED TO, READ BACK BY AND VERIFIED WITH: PHARMD [place] [id number] FCP    Culture (A)  Final    ESCHERICHIA COLI SUSCEPTIBILITIES TO FOLLOW Performed  at [facility], [place] [address] [place], [place] [address]    Report Status PENDING  Incomplete  Blood Culture ID Panel (Reflexed)     Status: Abnormal   Collection Time: 11/25/18  3:30 PM  Result Value Ref Range Status   Enterococcus species NOT DETECTED NOT DETECTED Final   Listeria monocytogenes NOT DETECTED NOT DETECTED Final   Staphylococcus species NOT DETECTED NOT DETECTED Final   Staphylococcus aureus (BCID) NOT DETECTED NOT DETECTED Final   Streptococcus species NOT DETECTED NOT DETECTED Final   Streptococcus agalactiae NOT DETECTED  NOT DETECTED Final   Streptococcus pneumoniae NOT DETECTED NOT DETECTED Final   Streptococcus pyogenes NOT DETECTED NOT DETECTED Final   Acinetobacter baumannii NOT DETECTED NOT DETECTED Final   Enterobacteriaceae species DETECTED (A) NOT DETECTED Final    Comment: Enterobacteriaceae represent a large family of gram-negative bacteria, not a single organism. CRITICAL RESULT CALLED TO, READ BACK BY AND VERIFIED WITH: PHARMD [place] [id number] FCP    Enterobacter cloacae complex NOT DETECTED NOT DETECTED Final   Escherichia coli DETECTED (A) NOT DETECTED Final    Comment: CRITICAL RESULT CALLED TO, READ BACK BY AND VERIFIED WITH: PHARMD MACCIA [id number] FCP    Klebsiella oxytoca NOT DETECTED NOT DETECTED Final   Klebsiella pneumoniae NOT DETECTED NOT DETECTED Final   Proteus species NOT DETECTED NOT DETECTED Final   Serratia marcescens NOT DETECTED NOT DETECTED Final   Carbapenem resistance NOT DETECTED NOT DETECTED Final   Haemophilus influenzae NOT DETECTED NOT DETECTED Final   Neisseria meningitidis NOT DETECTED NOT DETECTED Final   Pseudomonas aeruginosa NOT DETECTED NOT DETECTED Final   Candida albicans NOT DETECTED NOT DETECTED Final   Candida glabrata NOT DETECTED NOT DETECTED Final   Candida krusei NOT DETECTED NOT DETECTED Final   Candida parapsilosis NOT DETECTED NOT DETECTED Final   Candida tropicalis NOT DETECTED NOT  DETECTED Final    Comment: Performed at [facility], [place] [address] [place], [place] [address]  SARS Coronavirus 2 (CEPHEID - Performed in [place] hospital lab), Hosp Order     Status: None   Collection Time: 11/25/18  4:50 PM  Result Value Ref Range Status   SARS Coronavirus 2 NEGATIVE NEGATIVE Final    Comment: (NOTE) If result is NEGATIVE SARS-CoV-2 target nucleic acids are NOT DETECTED. The SARS-CoV-2 RNA is generally detectable in upper and lower  respiratory specimens during the acute phase of infection. The lowest  concentration of SARS-CoV-2 viral copies this assay can detect is 250  copies / mL. A negative result does not preclude SARS-CoV-2 infection  and should not be used as the sole basis for treatment or other  patient management decisions.  A negative result may occur with  improper specimen collection / handling, submission of specimen other  than nasopharyngeal swab, presence of viral mutation(s) within the  areas targeted by this assay, and inadequate number of viral copies  (<250 copies / mL). A negative result must be combined with clinical  observations, patient history, and epidemiological information. If result is POSITIVE SARS-CoV-2 target nucleic acids are DETECTED. The SARS-CoV-2 RNA is generally detectable in upper and lower  respiratory specimens dur ing the acute phase of infection.  Positive  results are indicative of active infection with SARS-CoV-2.  Clinical  correlation with patient history and other diagnostic information is  necessary to determine patient infection status.  Positive results do  not rule out bacterial infection or co-infection with other viruses. If result is PRESUMPTIVE POSTIVE SARS-CoV-2 nucleic acids MAY BE PRESENT.   A presumptive positive result was obtained on the submitted specimen  and confirmed on repeat testing.  While 2019 novel coronavirus  (SARS-CoV-2) nucleic acids may be present in the submitted sample   additional confirmatory testing may be necessary for epidemiological  and / or clinical management purposes  to differentiate between  SARS-CoV-2 and other Sarbecovirus currently known to infect humans.  If clinically indicated additional testing with an alternate test  methodology [phone]) is advised. The SARS-CoV-2 RNA is generally  detectable in upper  and lower respiratory sp ecimens during the acute  phase of infection. The expected result is Negative. Fact Sheet for Patients:  StrictlyIdeas.no Fact Sheet for Healthcare Providers: BankingDealers.co.za This test is not yet approved or cleared by the [place] FDA and has been authorized for detection and/or diagnosis of SARS-CoV-2 by FDA under an Emergency Use Authorization (EUA).  This EUA will remain in effect (meaning this test can be used) for the duration of the COVID-19 declaration under Section 564(b)(1) of the Act, 21 U.S.C. section 360bbb-3(b)(1), unless the authorization is terminated or revoked sooner. Performed at [facility], [place] [address], [place], [place] [address]   Surgical pcr screen     Status: None   Collection Time: 11/26/18 12:26 AM  Result Value Ref Range Status   MRSA, PCR NEGATIVE NEGATIVE Final   Staphylococcus aureus NEGATIVE NEGATIVE Final    Comment: (NOTE) The Xpert SA Assay (FDA approved for NASAL specimens in patients 75 years of age and older), is one component of a comprehensive surveillance program. It is not intended to diagnose infection nor to guide or monitor treatment. Performed at [facility], [place] [address] [place], [place] [address]       Studies: No results found.  Scheduled Meds: . cholecalciferol  1,000 Units Oral Daily  . enoxaparin (LOVENOX) injection  40 mg Subcutaneous Q24H  . hydrochlorothiazide  12.5 mg Oral q morning - 10a  . irbesartan  150 mg Oral Daily  . metoprolol succinate  75 mg Oral Daily   . pantoprazole  40 mg Oral Daily  . vitamin C  500 mg Oral Daily    Continuous Infusions: . cefTRIAXone (ROCEPHIN)  IV       LOS: 2 days     Kayleen Memos, MD [facility] Pager [phone]  If 7PM-7AM, please contact night-coverage [email or web address] Password [facility] 11/27/2018, 12:52 PM

## 2018-11-27 NOTE — Progress Notes (Addendum)
1 Day Post-Op  Subjective: Patient complains of some right abdominal soreness where drain is in place. Tolerating CLD, no N/V. No flatus or BM yet. Has been ambulating in halls. Voided this morning without difficulty.   Objective: Vital signs in last 24 hours: Temp:  [97.3 F (36.3 C)-98.7 F (37.1 C)] 98.7 F (37.1 C) (06/01 0450) Pulse Rate:  [68-81] 72 (06/01 0818) Resp:  [14-20] 14 (06/01 0450) BP: (127-151)/(71-102) 151/86 (06/01 0818) SpO2:  [96 %-99 %] 98 % (06/01 0450) Weight:  [87.5 kg] 87.5 kg (05/31 0945) Last BM Date: 11/25/18  Intake/Output from previous day: 05/31 0701 - 06/01 0700 In: 770 [P.O.:120; I.V.:600; IV Piggyback:50] Out: 205 [Drains:155; Blood:50] Intake/Output this shift: No intake/output data recorded.  PE: Gen: Awake and alert, NAD Heart:RRR  Lungs: CTA b/l, normal effort Abd: Soft, ND, appropriately tender over incision and drain site. No r/r/g. No peritonitis. Right sided drain in place with bloody SS fluid. 155cc recorded last 24/hours. +BS Msk: No edema   Lab Results:  Recent Labs    11/26/18 0321 11/27/18 0440  WBC 10.0 12.3*  HGB 9.9* 9.0*  HCT 29.9* 27.7*  PLT 220 208   BMET Recent Labs    11/26/18 0321 11/27/18 0440  NA 140 137  K 3.0* 3.6  CL 104 101  CO2 25 24  GLUCOSE 105* 175*  BUN 5* 5*  CREATININE 0.94 0.88  CALCIUM 8.3* 8.4*   PT/INR No results for input(s): LABPROT, INR in the last 72 hours. CMP     Component Value Date/Time   NA 137 11/27/2018 0440   K 3.6 11/27/2018 0440   CL 101 11/27/2018 0440   CO2 24 11/27/2018 0440   GLUCOSE 175 (H) 11/27/2018 0440   BUN 5 (L) 11/27/2018 0440   CREATININE 0.88 11/27/2018 0440   CREATININE 0.86 03/24/2016 1635   CALCIUM 8.4 (L) 11/27/2018 0440   PROT 6.1 (L) 11/27/2018 0440   ALBUMIN 2.4 (L) 11/27/2018 0440   AST 44 (H) 11/27/2018 0440   ALT 28 11/27/2018 0440   ALKPHOS 113 11/27/2018 0440   BILITOT 0.8 11/27/2018 0440   GFRNONAA >60 11/27/2018 0440   GFRAA >60 11/27/2018 0440   Lipase     Component Value Date/Time   LIPASE 21 11/25/2018 1530       Studies/Results: Ct Abdomen Pelvis W Contrast  Result Date: 11/25/2018 CLINICAL DATA:  Right upper quadrant pain. ERCP Nov 17, 2018. Nausea for several days. Fever last night. Vomiting this morning. EXAM: CT ABDOMEN AND PELVIS WITH CONTRAST TECHNIQUE: Multidetector CT imaging of the abdomen and pelvis was performed using the standard protocol following bolus administration of intravenous contrast. CONTRAST:  20m OMNIPAQUE IOHEXOL 300 MG/ML  SOLN COMPARISON:  Ultrasound Nov 13, 2018 FINDINGS: Lower chest: There is a small hiatal hernia. The lung bases and lower chest are otherwise normal. Hepatobiliary: No liver masses are identified. The gallbladder is somewhat thick walled. There is fat stranding adjacent to the gallbladder. There is also a small amount of fluid in the right upper quadrant, extending inferior to the gallbladder. There is air in the bile ducts consistent with recent ERCP and sphincterotomy. Intra and extrahepatic biliary duct prominence remains. Portal vein is patent. Pancreas: The fat stranding in fluid in the right upper quadrant abuts the pancreatic head. However, the pancreas itself is grossly unremarkable. Spleen: Normal in size without focal abnormality. Adrenals/Urinary Tract: Adrenal glands are normal. The kidneys are unremarkable. No hydronephrosis or perinephric stranding. The ureters are  normal. The bladder is unremarkable. Stomach/Bowel: There is a small hiatal hernia. The stomach is otherwise normal. The duodenum is unremarkable. The remainder of the small bowel is normal. Colonic diverticulosis is seen without diverticulitis. Visualized appendix is normal. Vascular/Lymphatic: Atherosclerotic changes are seen in the tortuous nonaneurysmal aorta. No adenopathy. Reproductive: Status post hysterectomy. No adnexal masses. Other: No abdominal wall hernia or abnormality. No  abdominopelvic ascites. Musculoskeletal: Scoliotic curvature lumbar spine, apex to the right. Degenerative changes in the thoracolumbar spine including the facets. IMPRESSION: 1. There is mild gallbladder wall thickening. There is fat stranding and fluid in the right upper quadrant abutting the duodenum, gallbladder, and pancreatic head. Findings are not specific but concerning for acute cholecystitis or cholangitis. The patient has a normal lipase today making pancreatitis unlikely. The duodenum demonstrates no definitive wall thickening making duodenitis less likely. Electronically Signed   By: Dorise Bullion III M.D   On: 11/25/2018 18:18    Anti-infectives: Anti-infectives (From admission, onward)   Start     Dose/Rate Route Frequency Ordered Stop   11/26/18 1615  piperacillin-tazobactam (ZOSYN) IVPB 3.375 g     3.375 g 100 mL/hr over 30 Minutes Intravenous  Once 11/26/18 1608 11/26/18 1653   11/25/18 2200  piperacillin-tazobactam (ZOSYN) IVPB 3.375 g     3.375 g 12.5 mL/hr over 240 Minutes Intravenous Every 8 hours 11/25/18 2044     11/25/18 1545  piperacillin-tazobactam (ZOSYN) IVPB 3.375 g     3.375 g 100 mL/hr over 30 Minutes Intravenous  Once 11/25/18 1534 11/25/18 1635       Assessment/Plan HTN HLD  E. Coli Bacteremia  Acute Calculous Cholecystitis  S/p ERCP/spincterotomy for choledocholithiasis - 5/22, Dr. Henrene Pastor S/p Fenestrated Laparoscopic Cholecystectomy, Dr. Donne Hazel, 11/26/2018 POD #1 - GB was noted to have purulence in surgery and was necrotic - T bili & Alk phos downtrending. AST likely reactive  - Continue abx for at least 5 days  - Maintain drain - Advance diet  - Ambulate and IS   FEN - Regular  VTE - SCDs, okay for chemical prophylaxis from a surgical standpoint ID - Zosyn 5/30 >>> WBC 12.3   LOS: 2 days    Jillyn Ledger , [facility] Surgery 11/27/2018, 9:13 AM Pager: [phone]

## 2018-11-28 LAB — CBC WITH DIFFERENTIAL/PLATELET
Abs Immature Granulocytes: 0.07 10*3/uL (ref 0.00–0.07)
Basophils Absolute: 0 10*3/uL (ref 0.0–0.1)
Basophils Relative: 0 %
Eosinophils Absolute: 0 10*3/uL (ref 0.0–0.5)
Eosinophils Relative: 0 %
HCT: 26 % — ABNORMAL LOW (ref 36.0–46.0)
Hemoglobin: 8.5 g/dL — ABNORMAL LOW (ref 12.0–15.0)
Immature Granulocytes: 1 %
Lymphocytes Relative: 24 %
Lymphs Abs: 2.6 10*3/uL (ref 0.7–4.0)
MCH: 26.2 pg (ref 26.0–34.0)
MCHC: 32.7 g/dL (ref 30.0–36.0)
MCV: 80.2 fL (ref 80.0–100.0)
Monocytes Absolute: 0.8 10*3/uL (ref 0.1–1.0)
Monocytes Relative: 7 %
Neutro Abs: 7.6 10*3/uL (ref 1.7–7.7)
Neutrophils Relative %: 68 %
Platelets: 225 10*3/uL (ref 150–400)
RBC: 3.24 MIL/uL — ABNORMAL LOW (ref 3.87–5.11)
RDW: 16.4 % — ABNORMAL HIGH (ref 11.5–15.5)
WBC: 11.1 10*3/uL — ABNORMAL HIGH (ref 4.0–10.5)
nRBC: 0 % (ref 0.0–0.2)

## 2018-11-28 LAB — CULTURE, BLOOD (ROUTINE X 2)

## 2018-11-28 MED ORDER — POTASSIUM CHLORIDE CRYS ER 20 MEQ PO TBCR
20.0000 meq | EXTENDED_RELEASE_TABLET | Freq: Every day | ORAL | Status: DC
  Administered 2018-11-28: 20 meq via ORAL
  Filled 2018-11-28: qty 1

## 2018-11-28 MED ORDER — SENNOSIDES-DOCUSATE SODIUM 8.6-50 MG PO TABS
1.0000 | ORAL_TABLET | Freq: Every day | ORAL | Status: DC
  Filled 2018-11-28 (×2): qty 1

## 2018-11-28 MED ORDER — DIPHENHYDRAMINE HCL 25 MG PO CAPS
25.0000 mg | ORAL_CAPSULE | ORAL | Status: DC | PRN
  Administered 2018-11-29 – 2018-12-01 (×3): 25 mg via ORAL
  Filled 2018-11-28 (×3): qty 1

## 2018-11-28 MED ORDER — SENNOSIDES-DOCUSATE SODIUM 8.6-50 MG PO TABS: 1.0000 | ORAL_TABLET | Freq: Two times a day (BID) | ORAL | Status: DC

## 2018-11-28 MED ORDER — TRAMADOL HCL 50 MG PO TABS: 50.0000 mg | ORAL_TABLET | Freq: Two times a day (BID) | ORAL | Status: DC | PRN

## 2018-11-28 MED ORDER — DIPHENHYDRAMINE HCL 50 MG/ML IJ SOLN
12.5000 mg | Freq: Three times a day (TID) | INTRAMUSCULAR | Status: DC | PRN
  Filled 2018-11-28: qty 1

## 2018-11-28 MED ORDER — OXYCODONE HCL 5 MG PO TABS
5.0000 mg | ORAL_TABLET | ORAL | Status: DC | PRN
  Administered 2018-11-28 – 2018-12-03 (×8): 5 mg via ORAL
  Filled 2018-11-28 (×10): qty 1

## 2018-11-28 MED ORDER — RISAQUAD PO CAPS
1.0000 | ORAL_CAPSULE | ORAL | Status: DC
  Administered 2018-12-01: 1 via ORAL
  Filled 2018-11-28: qty 1

## 2018-11-28 NOTE — Progress Notes (Addendum)
PROGRESS NOTE  Janice Rodriguez IZT:[id number] DOB: [birth year] DOA: 11/25/2018 PCP: [name], DO  HPI/Recap of past 24 hours: Janice Rodriguez is a 63 y.o. female with medical history significant for hypertension who has been having intermittent right upper quadrant abdominal pain which radiates to her back for 3 to 4 weeks.  She has been evaluated by her GI physician, Dr. Henrene Pastor.  She had abdominal ultrasound on 11/13/2018 which showed a dilated common bile duct.  She was started on oral ciprofloxacin.  She underwent ERCP on 11/17/2018 was found to have choledocholithiasis status post ERCP with sphincterotomy and successful bile duct stone extraction.  Since her ERCP, she has had continued intermittent mild right upper quadrant pain which has been 6-7 out of 10 at its most intense with continued radiation to her back.  Pain has been well controlled with as needed Tylenol.  This morning she developed shaking chills and fever at home.  She had nausea with one episode of bilious emesis.  Due to worsening symptoms she presented to the ED for further evaluation.  She denies any associated chest pain, palpitations, dyspnea, diarrhea, constipation, or dysuria.  She takes a daily aspirin 81 mg last dose this morning 11/25/2018.  She has no known history of CAD or CVA.  ED Course:  Initial vitals showed BP 99/61, pulse 99, RR 16, temp 98.6 Fahrenheit, SPO2 98% on room air.  Labs are notable for WBC 14.5, hemoglobin 11.5, platelets 264,000, BUN 9, creatinine 1.44, AST 19, ALT 16, alk phos 160, total bilirubin 1.7, lipase 21, lactic acid 1.2.  Blood cultures were obtained in process.  SARS-CoV-2 test was negative.  CT abdomen/pelvis showed gallbladder wall thickening changes suspicious for acute cholecystitis or cholangitis.    General surgery were consulted and recommended medical admission and plan for cholecystectomy in a.m.  EDP also discussed case with [facility] who are aware of case.   Patient was given 2 L normal saline and started on IV Zosyn.  TRH asked to admit.  Post laparoscopic cholecystectomy with intraoperative cholangiogram on 11/26/2018. Positive blood cultures 2 out of 2 bottles grew E. coli.  Completed 1 day of IV Zosyn perioperatively then switched to Rocephin 2 g daily on 11/27/2018.    11/28/18: Patient was seen and examined at her bedside.  No acute events overnight.  Positive flatus.  She Is Tolerating a solid Diet Well.  Denies nausea or significant abdominal pain.   Assessment/Plan: Principal Problem:   Cholangitis Active Problems:   Essential hypertension   AKI (acute kidney injury) ([place])   Acute calculus cholecystitis or cholangitis post lap chole with intraoperative cholangiogram on 11/26/2018 CT abdomen pelvis with contrast done on 11/25/2018 status post ERCP/sphincterotomy for choledocholithiasis on 11/17/2018 by Dr. Henrene Pastor  Continue recommendations per general surgery Normal lipase 21 on 11/25/2018 Right upper quadrant drain in place Continue incentive spirometer and ambulate as tolerated General surgery following Tolerating a solid diet well  E. coli bacteremia likely GI source Blood cultures drawn on 11/25/2018 grew E. coli 2 out of 2 bottles Completed 1 day of Zosyn perioperatively Switched to Rocephin 2 g daily on 11/27/2018 Awaiting susceptibilities Repeat blood cultures x2 done on 11/27/2018 no growth x1 day Afebrile   Leukocytosis Leukocytosis is trending down WBC 11.1 from 12.3 yesterday Afebrile  Hypokalemia, resolved post repletion Resume home KCl oral supplement  AKI, resolved, likely with prerenal Presented with creatinine of 1.44 with GFR of 44 Creatinine improved 0.88 from 0.94 with GFR greater than 60 Continue  to monitor urine output Repeat BMP in the morning  Hypertension Blood pressure is stable At home on metoprolol succinate 50 mg daily, HCTZ 12.5 mg daily, candesartan 16 mg daily Resume all antihypertensive home  medications  GERD Continue PPI  Hyperbilirubinemia Total bilirubin trending down from 1.7-1.4 this morning Repeat CMP in the morning  Elevated alkaline phosphatase, resolved  Elevated AST post surgery Level 44 from 16 Repeat CMP in the morning  Chronic normocytic anemia/acute blood loss anemia post surgery No sign of overt bleeding Hemoglobin 8.5 from 9.0 yesterday Monitor H&H    DVT prophylaxis: SCDs/Lovenox subcu daily Code Status: Full code, confirmed with patient Family Communication: Patient declined, she will communicate with family Disposition Plan:  Will discharge to home once surgery signs off and repeat blood cultures negative. Consults called: General surgery, GI consulted by EDP    Objective: Vitals:   11/27/18 1435 11/28/18 0500 11/28/18 0813 11/28/18 1336  BP: 120/70 122/67 (!) 148/93 (!) 143/71  Pulse: 75 83 77 69  Resp: 19 16  16   Temp: 97.9 F (36.6 C) 98.4 F (36.9 C)  97.9 F (36.6 C)  TempSrc: Oral Oral  Oral  SpO2: 100% 99%  100%  Weight:      Height:        Intake/Output Summary (Last 24 hours) at 11/28/2018 1541 Last data filed at 11/28/2018 1300 Gross per 24 hour  Intake 1000 ml  Output 50 ml  Net 950 ml   Filed Weights   11/25/18 2210 11/26/18 0945  Weight: 87.5 kg 87.5 kg    Exam:  . General: 63 y.o. year-old female well developed well nourished in no acute distress.  Alert and oriented x3. . Cardiovascular: Regular rate and rhythm with no rubs or gallops.  No JVD or thyromegaly noted.   Marland Kitchen Respiratory: Clear to auscultation with no wheezes or rales. Good inspiratory effort.   . Abdomen: Right upper quadrant drain in place.  Normal bowel sounds.  Nontender to palpation.   . Musculoskeletal: No lower extremity edema.  2 out of 4 pulses in all 4 extremities.   Marland Kitchen Psychiatry: Mood is appropriate for condition and setting.  Data Reviewed: CBC: Recent Labs  Lab 11/25/18 1530 11/26/18 0321 11/27/18 0440 11/28/18 0520  WBC 14.5*  10.0 12.3* 11.1*  NEUTROABS 12.4*  --  10.5* 7.6  HGB 11.5* 9.9* 9.0* 8.5*  HCT 35.4* 29.9* 27.7* 26.0*  MCV 81.2 80.2 81.0 80.2  PLT 264 220 208 786   Basic Metabolic Panel: Recent Labs  Lab 11/25/18 1530 11/26/18 0321 11/27/18 0440  NA 138 140 137  K 3.5 3.0* 3.6  CL 100 104 101  CO2 25 25 24   GLUCOSE 131* 105* 175*  BUN 9 5* 5*  CREATININE 1.44* 0.94 0.88  CALCIUM 9.0 8.3* 8.4*   GFR: Estimated Creatinine Clearance: 71 mL/min (by C-G formula based on SCr of 0.88 mg/dL). Liver Function Tests: Recent Labs  Lab 11/25/18 1530 11/26/18 0321 11/27/18 0440  AST 19 16 44*  ALT 16 13 28   ALKPHOS 160* 132* 113  BILITOT 1.7* 1.4* 0.8  PROT 7.5 6.1* 6.1*  ALBUMIN 3.1* 2.6* 2.4*   Recent Labs  Lab 11/25/18 1530  LIPASE 21   No results for input(s): AMMONIA in the last 168 hours. Coagulation Profile: No results for input(s): INR, PROTIME in the last 168 hours. Cardiac Enzymes: No results for input(s): CKTOTAL, CKMB, CKMBINDEX, TROPONINI in the last 168 hours. BNP (last 3 results) No results for input(s): PROBNP  in the last 8760 hours. HbA1C: No results for input(s): HGBA1C in the last 72 hours. CBG: No results for input(s): GLUCAP in the last 168 hours. Lipid Profile: No results for input(s): CHOL, HDL, LDLCALC, TRIG, CHOLHDL, LDLDIRECT in the last 72 hours. Thyroid Function Tests: No results for input(s): TSH, T4TOTAL, FREET4, T3FREE, THYROIDAB in the last 72 hours. Anemia Panel: No results for input(s): VITAMINB12, FOLATE, FERRITIN, TIBC, IRON, RETICCTPCT in the last 72 hours. Urine analysis:    Component Value Date/Time   COLORURINE YELLOW 11/26/2018 0050   APPEARANCEUR CLEAR 11/26/2018 0050   LABSPEC 1.030 11/26/2018 0050   PHURINE 5.0 11/26/2018 0050   GLUCOSEU NEGATIVE 11/26/2018 0050   GLUCOSEU NEGATIVE 10/17/2014 0909   HGBUR NEGATIVE 11/26/2018 0050   BILIRUBINUR NEGATIVE 11/26/2018 0050   KETONESUR 5 (A) 11/26/2018 0050   PROTEINUR NEGATIVE  11/26/2018 0050   UROBILINOGEN 0.2 10/17/2014 0909   NITRITE NEGATIVE 11/26/2018 0050   LEUKOCYTESUR LARGE (A) 11/26/2018 0050   Sepsis Labs: @LABRCNTIP (procalcitonin:4,lacticidven:4)  ) Recent Results (from the past 240 hour(s))  Culture, blood (routine x 2)     Status: Abnormal   Collection Time: 11/25/18  3:30 PM  Result Value Ref Range Status   Specimen Description BLOOD RIGHT ARM  Final   Special Requests   Final    BOTTLES DRAWN AEROBIC AND ANAEROBIC Blood Culture results may not be optimal due to an inadequate volume of blood received in culture bottles   Culture  Setup Time   Final    GRAM NEGATIVE RODS IN BOTH AEROBIC AND ANAEROBIC BOTTLES CRITICAL VALUE NOTED.  VALUE IS CONSISTENT WITH PREVIOUSLY REPORTED AND CALLED VALUE.    Culture (A)  Final    ESCHERICHIA COLI SUSCEPTIBILITIES PERFORMED ON PREVIOUS CULTURE WITHIN THE LAST 5 DAYS. Performed at [facility], [place] [address] [place], [place] [address]    Report Status 11/28/2018 FINAL  Final  Culture, blood (routine x 2)     Status: Abnormal   Collection Time: 11/25/18  3:30 PM  Result Value Ref Range Status   Specimen Description BLOOD LEFT ARM  Final   Special Requests   Final    BOTTLES DRAWN AEROBIC AND ANAEROBIC Blood Culture results may not be optimal due to an inadequate volume of blood received in culture bottles   Culture  Setup Time   Final    IN BOTH AEROBIC AND ANAEROBIC BOTTLES GRAM NEGATIVE RODS CRITICAL RESULT CALLED TO, READ BACK BY AND VERIFIED WITH: [facility] [place] [id number] FCP Performed at [facility], [place] [address] [place], [place] [address]    Culture ESCHERICHIA COLI (A)  Final   Report Status 11/28/2018 FINAL  Final   Organism ID, Bacteria ESCHERICHIA COLI  Final      Susceptibility   Escherichia coli - MIC*    AMPICILLIN >=32 RESISTANT Resistant     CEFAZOLIN 32 INTERMEDIATE Intermediate     CEFEPIME <=1 SENSITIVE Sensitive     CEFTAZIDIME <=1 SENSITIVE Sensitive      CEFTRIAXONE <=1 SENSITIVE Sensitive     CIPROFLOXACIN >=4 RESISTANT Resistant     GENTAMICIN <=1 SENSITIVE Sensitive     IMIPENEM <=0.25 SENSITIVE Sensitive     TRIMETH/SULFA <=20 SENSITIVE Sensitive     AMPICILLIN/SULBACTAM >=32 RESISTANT Resistant     Extended ESBL NEGATIVE Sensitive     * ESCHERICHIA COLI  Blood Culture ID Panel (Reflexed)     Status: Abnormal   Collection Time: 11/25/18  3:30 PM  Result Value  Ref Range Status   Enterococcus species NOT DETECTED NOT DETECTED Final   Listeria monocytogenes NOT DETECTED NOT DETECTED Final   Staphylococcus species NOT DETECTED NOT DETECTED Final   Staphylococcus aureus (BCID) NOT DETECTED NOT DETECTED Final   Streptococcus species NOT DETECTED NOT DETECTED Final   Streptococcus agalactiae NOT DETECTED NOT DETECTED Final   Streptococcus pneumoniae NOT DETECTED NOT DETECTED Final   Streptococcus pyogenes NOT DETECTED NOT DETECTED Final   Acinetobacter baumannii NOT DETECTED NOT DETECTED Final   Enterobacteriaceae species DETECTED (A) NOT DETECTED Final    Comment: Enterobacteriaceae represent a large family of gram-negative bacteria, not a single organism. CRITICAL RESULT CALLED TO, READ BACK BY AND VERIFIED WITH: PHARMD Sidney [id number] FCP    Enterobacter cloacae complex NOT DETECTED NOT DETECTED Final   Escherichia coli DETECTED (A) NOT DETECTED Final    Comment: CRITICAL RESULT CALLED TO, READ BACK BY AND VERIFIED WITH: PHARMD MACCIA [id number] FCP    Klebsiella oxytoca NOT DETECTED NOT DETECTED Final   Klebsiella pneumoniae NOT DETECTED NOT DETECTED Final   Proteus species NOT DETECTED NOT DETECTED Final   Serratia marcescens NOT DETECTED NOT DETECTED Final   Carbapenem resistance NOT DETECTED NOT DETECTED Final   Haemophilus influenzae NOT DETECTED NOT DETECTED Final   Neisseria meningitidis NOT DETECTED NOT DETECTED Final   Pseudomonas aeruginosa NOT DETECTED NOT DETECTED Final   Candida albicans NOT DETECTED NOT  DETECTED Final   Candida glabrata NOT DETECTED NOT DETECTED Final   Candida krusei NOT DETECTED NOT DETECTED Final   Candida parapsilosis NOT DETECTED NOT DETECTED Final   Candida tropicalis NOT DETECTED NOT DETECTED Final    Comment: Performed at [facility], [place] [address] [place], [place] [address]  SARS Coronavirus 2 (CEPHEID - Performed in [place] hospital lab), Hosp Order     Status: None   Collection Time: 11/25/18  4:50 PM  Result Value Ref Range Status   SARS Coronavirus 2 NEGATIVE NEGATIVE Final    Comment: (NOTE) If result is NEGATIVE SARS-CoV-2 target nucleic acids are NOT DETECTED. The SARS-CoV-2 RNA is generally detectable in upper and lower  respiratory specimens during the acute phase of infection. The lowest  concentration of SARS-CoV-2 viral copies this assay can detect is 250  copies / mL. A negative result does not preclude SARS-CoV-2 infection  and should not be used as the sole basis for treatment or other  patient management decisions.  A negative result may occur with  improper specimen collection / handling, submission of specimen other  than nasopharyngeal swab, presence of viral mutation(s) within the  areas targeted by this assay, and inadequate number of viral copies  (<250 copies / mL). A negative result must be combined with clinical  observations, patient history, and epidemiological information. If result is POSITIVE SARS-CoV-2 target nucleic acids are DETECTED. The SARS-CoV-2 RNA is generally detectable in upper and lower  respiratory specimens dur ing the acute phase of infection.  Positive  results are indicative of active infection with SARS-CoV-2.  Clinical  correlation with patient history and other diagnostic information is  necessary to determine patient infection status.  Positive results do  not rule out bacterial infection or co-infection with other viruses. If result is PRESUMPTIVE POSTIVE SARS-CoV-2 nucleic acids MAY BE  PRESENT.   A presumptive positive result was obtained on the submitted specimen  and confirmed on repeat testing.  While 2019 novel coronavirus  (SARS-CoV-2) nucleic acids may be present in the submitted  sample  additional confirmatory testing may be necessary for epidemiological  and / or clinical management purposes  to differentiate between  SARS-CoV-2 and other Sarbecovirus currently known to infect humans.  If clinically indicated additional testing with an alternate test  methodology [phone]) is advised. The SARS-CoV-2 RNA is generally  detectable in upper and lower respiratory sp ecimens during the acute  phase of infection. The expected result is Negative. Fact Sheet for Patients:  StrictlyIdeas.no Fact Sheet for Healthcare Providers: BankingDealers.co.za This test is not yet approved or cleared by the [place] FDA and has been authorized for detection and/or diagnosis of SARS-CoV-2 by FDA under an Emergency Use Authorization (EUA).  This EUA will remain in effect (meaning this test can be used) for the duration of the COVID-19 declaration under Section 564(b)(1) of the Act, 21 U.S.C. section 360bbb-3(b)(1), unless the authorization is terminated or revoked sooner. Performed at [facility], [place] [address] [place], [place] [address]   Surgical pcr screen     Status: None   Collection Time: 11/26/18 12:26 AM  Result Value Ref Range Status   MRSA, PCR NEGATIVE NEGATIVE Final   Staphylococcus aureus NEGATIVE NEGATIVE Final    Comment: (NOTE) The Xpert SA Assay (FDA approved for NASAL specimens in patients 62 years of age and older), is one component of a comprehensive surveillance program. It is not intended to diagnose infection nor to guide or monitor treatment. Performed at [facility], [place] [address], [place], [place] [address]   Culture, blood (routine x 2)     Status: None (Preliminary result)    Collection Time: 11/27/18  4:35 AM  Result Value Ref Range Status   Specimen Description BLOOD LEFT ANTECUBITAL  Final   Special Requests   Final    BOTTLES DRAWN AEROBIC ONLY Blood Culture adequate volume   Culture   Final    NO GROWTH 1 DAY Performed at [facility], [place] [address], [place], [place] [address]    Report Status PENDING  Incomplete  Culture, blood (routine x 2)     Status: None (Preliminary result)   Collection Time: 11/27/18  4:40 AM  Result Value Ref Range Status   Specimen Description BLOOD LEFT HAND  Final   Special Requests   Final    BOTTLES DRAWN AEROBIC ONLY Blood Culture adequate volume   Culture   Final    NO GROWTH 1 DAY Performed at [facility], [place] [facility] [place] [address], [place], [place] [address]    Report Status PENDING  Incomplete      Studies: No results found.  Scheduled Meds: . [START ON 11/29/2018] acidophilus  1 capsule Oral Once per day on Mon Wed Fri  . cholecalciferol  1,000 Units Oral Daily  . enoxaparin (LOVENOX) injection  40 mg Subcutaneous Q24H  . hydrochlorothiazide  12.5 mg Oral q morning - 10a  . irbesartan  150 mg Oral Daily  . metoprolol succinate  75 mg Oral Daily  . pantoprazole  40 mg Oral Daily  . potassium chloride SA  20 mEq Oral Daily  . vitamin C  500 mg Oral Daily    Continuous Infusions: . cefTRIAXone (ROCEPHIN)  IV 2 g (11/28/18 1339)     LOS: 3 days     Kayleen Memos, MD [facility] Pager [phone]  If 7PM-7AM, please contact night-coverage [email or web address] Password [email or web address] 11/28/2018, 3:41 PM

## 2018-11-28 NOTE — Progress Notes (Addendum)
2 Days Post-Op  Subjective: CC: Pain at drain site Patient reports that she did well yesterday and this morning. Was able to tolerate a regular diet without N/V. Did have an episode of diarrhea yesterday. Does have some discomfort around the drain site. No other abdominal pain. 50cc from drain overnight, bloody/SS. Has been mobilizing in halls several times per day. Pulling ~2000 on IS. Was started on Lovenox yesterday but did deffer this. We discussed reasoning for Lovenox and risks of PE/DVT after surgeries. 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   Objective: Vital signs in last 24 hours: Temp:  [97.9 F (36.6 C)-98.4 F (36.9 C)] 98.4 F (36.9 C) (06/02 0500) Pulse Rate:  [75-83] 77 (06/02 0813) Resp:  [16-19] 16 (06/02 0500) BP: (120-148)/(67-93) 148/93 (06/02 0813) SpO2:  [99 %-100 %] 99 % (06/02 0500) Last BM Date: 11/27/18  Intake/Output from previous day: 06/01 0701 - 06/02 0700 In: 760 [P.O.:660; IV Piggyback:100] Out: 50 [Drains:50] Intake/Output this shift: No intake/output data recorded.  PE: Gen: Awake and alert, NAD Heart: RRR  Lungs: CTA b/l, normal effort Abd: Soft, ND, appropriately tender over incision and drain site. Incisions c/d/i with steri strips over laparoscopic sites. No r/r/g. No peritonitis. Right sided drain in place with bloody SS fluid. 50cc recorded last 24/hours. Tegederm removed and drain sponge placed. +BS Msk: No edema    Lab Results:  Recent Labs    11/27/18 0440 11/28/18 0520  WBC 12.3* 11.1*  HGB 9.0* 8.5*  HCT 27.7* 26.0*  PLT 208 225   BMET Recent Labs    11/26/18 0321 11/27/18 0440  NA 140 137  K 3.0* 3.6  CL 104 101  CO2 25 24  GLUCOSE 105* 175*  BUN 5* 5*  CREATININE 0.94 0.88  CALCIUM 8.3* 8.4*   PT/INR No results for input(s): LABPROT, INR in the last 72 hours. CMP     Component Value Date/Time   NA 137 11/27/2018 0440   K 3.6 11/27/2018 0440   CL 101 11/27/2018  0440   CO2 24 11/27/2018 0440   GLUCOSE 175 (H) 11/27/2018 0440   BUN 5 (L) 11/27/2018 0440   CREATININE 0.88 11/27/2018 0440   CREATININE 0.86 03/24/2016 1635   CALCIUM 8.4 (L) 11/27/2018 0440   PROT 6.1 (L) 11/27/2018 0440   ALBUMIN 2.4 (L) 11/27/2018 0440   AST 44 (H) 11/27/2018 0440   ALT 28 11/27/2018 0440   ALKPHOS 113 11/27/2018 0440   BILITOT 0.8 11/27/2018 0440   GFRNONAA >60 11/27/2018 0440   GFRAA >60 11/27/2018 0440   Lipase     Component Value Date/Time   LIPASE 21 11/25/2018 1530       Studies/Results: No results found.  Anti-infectives: Anti-infectives (From admission, onward)   Start     Dose/Rate Route Frequency Ordered Stop   11/27/18 1400  cefTRIAXone (ROCEPHIN) 2 g in sodium chloride 0.9 % 100 mL IVPB     2 g 200 mL/hr over 30 Minutes Intravenous Every 24 hours 11/27/18 1023     11/26/18 1615  piperacillin-tazobactam (ZOSYN) IVPB 3.375 g     3.375 g 100 mL/hr over 30 Minutes Intravenous  Once 11/26/18 1608 11/26/18 1653   11/25/18 2200  piperacillin-tazobactam (ZOSYN) IVPB 3.375 g  Status:  Discontinued     3.375 g 12.5 mL/hr over 240 Minutes Intravenous Every 8 hours 11/25/18 2044 11/27/18 1023   11/25/18 1545  piperacillin-tazobactam (ZOSYN) IVPB 3.375 g  3.375 g 100 mL/hr over 30 Minutes Intravenous  Once 11/25/18 1534 11/25/18 1635       Assessment/Plan HTN HLD  E. Coli Bacteremia  Acute Calculous Cholecystitis  S/p ERCP/spincterotomy for choledocholithiasis - 5/22, Dr. Henrene Pastor S/p Laparoscopic Fenestrated Subtotal Cholecystectomy, Dr. Donne Hazel, 11/26/2018 POD #2 - GB was noted to have purulence in surgery and was necrotic - T bili & Alk phos downtrending 6/1. AST likely reactive  - Continue abx for at least 5 days. This was narrowed to Rocephin by TRH based on Cx report.  - Maintain drain. Discussed drain care and what to look for at home with patient (bilious output)  - Advance diet  - Ambulate and IS   FEN - Regular  VTE -  SCDs, Lovenox (patient has agreed to start this) Hgb 9.9>9.0>8.5. VSS ID - Zosyn 5/30-6/1. Rocephin 6/1 >> (day 2). WBC 12.3>11.1 Foley - None Follow up - Dr. Donne Hazel    LOS: 3 days    Jillyn Ledger , [facility] Surgery 11/28/2018, 8:34 AM Pager: [phone]

## 2018-11-28 NOTE — Evaluation (Signed)
Physical Therapy Evaluation Patient Details Name: Janice Rodriguez MRN: [id number] DOB: 1956-05-18 Today's Date: 11/28/2018   History of Present Illness  Patient is a 63 y/o female who presents with RUQ pain, fever, N/V. CT abdomen/pelvis- gallbladder wall thickening changes suspicious for acute Calculous Cholecystitis. Also tested positive for E. Coli Bacteremia. s/p Laparoscopic Cholecystectomy 5/31. PMH includes HTN, HLD, LVH.  Clinical Impression  Patient presents with pain and post surgical deficits s/p above surgery. 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 Pt tolerated transfers, gait training and stair training with supervision progressing to Mod I for safety. 2/4 DOE noted with activity. Pt has been walking halls 5 times/day without difficulty. Encouraged importance of walking/mobility/IS. Pt does not require skilled therapy services due to above. All education completed. Discharge from therapy.    Follow Up Recommendations No PT follow up    Equipment Recommendations  None recommended by PT    Recommendations for Other Services       Precautions / Restrictions Precautions Precautions: None Restrictions Weight Bearing Restrictions: No      Mobility  Bed Mobility Overal bed mobility: Modified Independent             General bed mobility comments: No assist.   Transfers Overall transfer level: Modified independent               General transfer comment: Stood from EOB x1, transferred to chair post ambulation.  Ambulation/Gait Ambulation/Gait assistance: Modified independent (Device/Increase time) Gait Distance (Feet): 500 Feet Assistive device: None Gait Pattern/deviations: Step-through pattern;Decreased stride length   Gait velocity interpretation: >2.62 ft/sec, indicative of community ambulatory General Gait Details: Steady gait, no overt LOB. 2/4 DOE.   Stairs Stairs: Yes Stairs assistance: Modified independent (Device/Increase  time) Stair Management: One rail Right Number of Stairs: 13 General stair comments: USe of rail for support. 2/4 DOE. VSS.  Wheelchair Mobility    Modified Rankin (Stroke Patients Only)       Balance Overall balance assessment: Needs assistance Sitting-balance support: Feet supported;No upper extremity supported Sitting balance-Leahy Scale: Good Sitting balance - Comments: Able to reach outside BoS and donn shoes without difficulty.    Standing balance support: During functional activity Standing balance-Leahy Scale: Good                               Pertinent Vitals/Pain Pain Assessment: 0-10 Pain Score: 1  Pain Location: around drain site Pain Descriptors / Indicators: Sore Pain Intervention(s): Monitored during session;Premedicated before session    Home Living Family/patient expects to be discharged to:: Private residence Living Arrangements: Spouse/significant other Available Help at Discharge: Family;Available 24 hours/day Type of Home: House Home Access: Stairs to enter Entrance Stairs-Rails: Right Entrance Stairs-Number of Steps: 3 thru garage; 6 through front Home Layout: Two level Home Equipment: Walker - 2 wheels      Prior Function Level of Independence: Independent         Comments: 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     Hand Dominance        Extremity/Trunk Assessment   Upper Extremity Assessment Upper Extremity Assessment: Defer to OT evaluation    Lower Extremity Assessment Lower Extremity Assessment: Overall WFL for tasks assessed       Communication   Communication: No difficulties  Cognition Arousal/Alertness: Awake/alert Behavior During Therapy: WFL for tasks assessed/performed Overall Cognitive Status: Within Functional Limits for tasks assessed  General Comments      Exercises     Assessment/Plan    PT Assessment Patent does not need any further  PT services  PT Problem List         PT Treatment Interventions      PT Goals (Current goals can be found in the Care Plan section)  Acute Rehab PT Goals Patient Stated Goal: to go home PT Goal Formulation: All assessment and education complete, DC therapy    Frequency     Barriers to discharge        Co-evaluation               AM-PAC PT "6 Clicks" Mobility  Outcome Measure Help needed turning from your back to your side while in a flat bed without using bedrails?: None Help needed moving from lying on your back to sitting on the side of a flat bed without using bedrails?: None Help needed moving to and from a bed to a chair (including a wheelchair)?: None Help needed standing up from a chair using your arms (e.g., wheelchair or bedside chair)?: None Help needed to walk in hospital room?: None Help needed climbing 3-5 steps with a railing? : A Little 6 Click Score: 23    End of Session   Activity Tolerance: Patient tolerated treatment well Patient left: in chair;with call bell/phone within reach Nurse Communication: Mobility status PT Visit Diagnosis: Pain Pain - Right/Left: Right Pain - part of body: (flank near surgical site)    Time: 0729-0800 PT Time Calculation (min) (ACUTE ONLY): 31 min   Charges:   PT Evaluation $PT Eval Low Complexity: 1 Low PT Treatments $Gait Training: 8-22 mins        Wray Kearns, PT, DPT Acute Rehabilitation Services Pager [phone] Office [phone]      Marguarite Arbour A Sabra Heck 11/28/2018, 9:21 AM

## 2018-11-29 DIAGNOSIS — K838 Other specified diseases of biliary tract: Secondary | ICD-10-CM

## 2018-11-29 DIAGNOSIS — K839 Disease of biliary tract, unspecified: Secondary | ICD-10-CM

## 2018-11-29 DIAGNOSIS — I10 Essential (primary) hypertension: Secondary | ICD-10-CM

## 2018-11-29 DIAGNOSIS — N179 Acute kidney failure, unspecified: Secondary | ICD-10-CM

## 2018-11-29 LAB — COMPREHENSIVE METABOLIC PANEL
ALT: 17 U/L (ref 0–44)
AST: 17 U/L (ref 15–41)
Albumin: 2.3 g/dL — ABNORMAL LOW (ref 3.5–5.0)
Alkaline Phosphatase: 91 U/L (ref 38–126)
Anion gap: 12 (ref 5–15)
BUN: 7 mg/dL — ABNORMAL LOW (ref 8–23)
CO2: 27 mmol/L (ref 22–32)
Calcium: 8.4 mg/dL — ABNORMAL LOW (ref 8.9–10.3)
Chloride: 103 mmol/L (ref 98–111)
Creatinine, Ser: 0.84 mg/dL (ref 0.44–1.00)
GFR calc Af Amer: >60 mL/min (ref >60)
GFR calc non Af Amer: >60 mL/min (ref >60)
Glucose, Bld: 112 mg/dL — ABNORMAL HIGH (ref 70–99)
Potassium: 3.1 mmol/L — ABNORMAL LOW (ref 3.5–5.1)
Sodium: 142 mmol/L (ref 135–145)
Total Bilirubin: 0.9 mg/dL (ref 0.3–1.2)
Total Protein: 5.8 g/dL — ABNORMAL LOW (ref 6.5–8.1)

## 2018-11-29 LAB — CBC
HCT: 26.3 % — ABNORMAL LOW (ref 36.0–46.0)
Hemoglobin: 8.7 g/dL — ABNORMAL LOW (ref 12.0–15.0)
MCH: 26.9 pg (ref 26.0–34.0)
MCHC: 33.1 g/dL (ref 30.0–36.0)
MCV: 81.4 fL (ref 80.0–100.0)
Platelets: 249 10*3/uL (ref 150–400)
RBC: 3.23 MIL/uL — ABNORMAL LOW (ref 3.87–5.11)
RDW: 16.7 % — ABNORMAL HIGH (ref 11.5–15.5)
WBC: 7.6 10*3/uL (ref 4.0–10.5)
nRBC: 0 % (ref 0.0–0.2)

## 2018-11-29 MED ORDER — POTASSIUM CHLORIDE CRYS ER 20 MEQ PO TBCR
60.0000 meq | EXTENDED_RELEASE_TABLET | Freq: Once | ORAL | Status: AC
  Administered 2018-11-29: 10:00:00 60 meq via ORAL
  Filled 2018-11-29: qty 3

## 2018-11-29 NOTE — H&P (View-Only) (Signed)
[facility] Consult: 9:44 AM 11/29/2018  LOS: 4 days    Referring Provider: Dr   Primary Care Physician:  [name], DO Primary Gastroenterologist:  Dr. Henrene Pastor.     Reason for Consultation:  Bile leak post partial cholecystectomy?   HPI: Janice Rodriguez is a 63 y.o. female.  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    PMH GERD.  Esophageal stricture.  Obesity.  Umbilical hernia repair age 89.  Vaginal hysterectomy 1992. 2017 colonoscopy: Diverticulosis. 2017 EGD: Mild esophagitis, esophageal stricture.  Visit with Dr Henrene Pastor 11/02/18 for intermittent abd pain, n/v, and shaking chills.  He suspected GB stones, RXd her with Cipro and arranged for: 11/13/18 abdominal ultrasound:   Gallbladder contracted, gallstones and gallbladder adenomyomatosis present.  Intra-and extrahepatic biliary ductal dilatation, CBD 14 mm.  Unable to visualize distal CBD, cannot exclude distal CBD stone. 11/14/18 labs with t bili 2.2. alk phos 396, AST/ALT 137/108.  11/17/2018 ERCP.  Choledocholithiasis.  S/p sphincterotomy and extraction of bile duct stone.  Cholelthiasis.   Admitted 11/25/2018 with symptomatic cholecystitis,  cholangitis.  She felt okay initially after ERCP but began having having abdominal pain and later fevers and vomiting within a couple of days of the procedure. CTAP showed gallbladder wall thickening, stranding RUQ abutting duodenum, GB, pancreatic head.  WBCs 14.5. Normal Lipase, improved elevation LFTs.  Dehydrated with now resolved mild AKI.   Grew E coli from 2/2 blood clxs.    11/26/2018 "fenestrating", subtotal lap chole.   Dr. Donne Hazel.  GB necrotic, partially removed and Blake drain placed.  Continues on Rocephin.  Pain at drain site continues, drain output is darker today, it was more  sanguinous until today,  Increasing volume.  Output over previous 3 days: 155, 50, 160 ml.  Is tolerating diet and walking in hall.  Watery stool when she eats. Pain is only at drain site.     Overall has lost close to 20 pounds over the last few months.  Had been complaining of dysphagia when seen by Dr. Henrene Pastor a month ago, this is not currently an issue.  Family history: Breast cancer in mother.  Colon cancer in father and paternal aunt.  Heart disease, diabetes, hyperlipidemia, hypertension, sarcoidosis, stroke in first-degree relatives  Past Medical History:  Diagnosis Date  . ASYMPTOMATIC POSTMENOPAUSAL STATUS   . Blood transfusion without reported diagnosis 1980's  . Female cystocele   . Gall stones, common bile duct    dialated bile duct per [place] called today 11-13-2018  . GERD   . History of irregular heartbeat   . HYPERLIPIDEMIA   . HYPERTENSION   . Left atrial enlargement 12/2013   Noted on ECHO  . LVH (left ventricular hypertrophy) 12/2013   Mild, noted on ECHO  . OBESITY   . SCOLIOSIS   . 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 UMBILICAL HERNIORRHAPHY, HX OF    age 48  . Vitamin D deficiency   . Wears glasses  Past Surgical History:  Procedure Laterality Date  . CHOLECYSTECTOMY N/A 11/26/2018   Procedure: LAPAROSCOPIC CHOLECYSTECTOMY WITH INTRAOPERATIVE CHOLANGIOGRAM;  Surgeon: Rolm Bookbinder, MD;  Location: [facility];  Service: General;  Laterality: N/A;  . COLONOSCOPY  2012   [place]  . COLONOSCOPY  10/2015  . [place]  . ENDOSCOPIC RETROGRADE CHOLANGIOPANCREATOGRAPHY (ERCP) WITH PROPOFOL N/A 11/17/2018   Procedure: ENDOSCOPIC RETROGRADE CHOLANGIOPANCREATOGRAPHY (ERCP) WITH PROPOFOL;  Surgeon: Irene Shipper, MD;  Location: [facility];  Service: Endoscopy;  Laterality: N/A;  . REMOVAL OF STONES  11/17/2018   Procedure: REMOVAL OF STONES;  Surgeon: Irene Shipper, MD;  Location: [facility];  Service: Endoscopy;;  . [name]  11/17/2018   Procedure: SPHINCTEROTOMY;  Surgeon: Irene Shipper, MD;  Location: [facility];  Service: Endoscopy;;  . [place]  . UPPER GASTROINTESTINAL ENDOSCOPY  10/2015  . VAGINAL HYSTERECTOMY  1992    Prior to Admission medications   Medication Sig Start Date End Date Taking? Authorizing Provider  acetaminophen (TYLENOL) 500 MG tablet Take 1,000 mg by mouth every 6 (six) hours as needed (pain).   Yes [provider]  aspirin EC 81 MG tablet Take 81 mg by mouth daily.   Yes [provider]  Calcium Carbonate Antacid (TUMS E-X PO) Take 1 tablet by mouth daily as needed (indigestion/ acid reflux).   Yes [provider]  candesartan (ATACAND) 16 MG tablet TAKE 1 TABLET BY MOUTH ONCE DAILY Patient taking differently: Take 16 mg by mouth daily at 12 noon.  06/12/18  Yes Belva Crome, MD  Cholecalciferol (VITAMIN D3 PO) Take 1 tablet by mouth daily.    Yes [provider]  esomeprazole (NEXIUM) 40 MG capsule Take 1 capsule (40 mg total) by mouth 2 (two) times daily before a meal. Patient taking differently: Take 40 mg by mouth every evening.  11/02/18  Yes Irene Shipper, MD  hydrochlorothiazide (MICROZIDE) 12.5 MG capsule Take 1 capsule (12.5 mg total) by mouth every morning. 04/18/18  Yes Belva Crome, MD  metoprolol succinate (TOPROL-XL) 50 MG 24 hr tablet Take 1.5 tablets (75 mg) by mouth daily with or immediately following a meal. Patient taking differently: Take 75 mg by mouth daily with breakfast.  04/18/18  Yes Belva Crome, MD  potassium chloride SA (K-DUR,KLOR-CON) 20 MEQ tablet Take 1 tablet (20 mEq total) by mouth daily. 04/18/18  Yes Belva Crome, MD  Probiotic Product (PROBIOTIC DAILY PO) Take 1 capsule by mouth 3 (three) times a week. Reported on 11/21/2015   Yes [provider]  vitamin C (ASCORBIC ACID) 500 MG tablet Take 500 mg by  mouth daily.   Yes [provider]  ciprofloxacin (CIPRO) 500 MG tablet Take 1 tablet (500 mg total) by mouth 2 (two) times daily. Go ahead and take 2 doses today Patient not taking: Reported on 11/25/2018 11/14/18   Irene Shipper, MD    Scheduled Meds: . acidophilus  1 capsule Oral Once per day on Mon Wed Fri  . cholecalciferol  1,000 Units Oral Daily  . enoxaparin (LOVENOX) injection  40 mg Subcutaneous Q24H  . hydrochlorothiazide  12.5 mg Oral q morning - 10a  . irbesartan  150 mg Oral Daily  . metoprolol succinate  75 mg Oral Daily  . pantoprazole  40 mg Oral Daily  . senna-docusate  1 tablet Oral QHS  . vitamin C  500 mg Oral Daily   Infusions: . cefTRIAXone (  ROCEPHIN)  IV 2 g (11/28/18 1339)   PRN Meds: acetaminophen **OR** acetaminophen, diphenhydrAMINE, hydrALAZINE, morphine injection, ondansetron **OR** ondansetron (ZOFRAN) IV, oxyCODONE, traMADol   Allergies as of 11/25/2018  . (No Known Allergies)    Family History  Problem Relation Age of Onset  . Breast cancer Mother   . Diabetes Mother 34  . Stroke Father   . Heart disease Father   . Colon cancer Father        not sure of age dx  . Stroke Brother   . Diabetes Brother   . Sarcoidosis Brother   . Colon cancer Paternal Aunt   . Diabetes Other   . Hypertension Other   . Hyperlipidemia Other   . Heart attack Brother   . Esophageal cancer Neg Hx   . Pancreatic cancer Neg Hx   . Rectal cancer Neg Hx   . Stomach cancer Neg Hx   . Colon polyps Neg Hx     Social History   Socioeconomic History  . Marital status: Married    Spouse name: Purcell Nails  . Number of children: Not on file  . Years of education: Not on file  . Highest education level: Not on file  Occupational History  . Not on file  Social Needs  . Financial resource strain: Not on file  . Food insecurity:    Worry: Not on file    Inability: Not on file  . Transportation needs:    Medical: Not on file    Non-medical: Not on file   Tobacco Use  . Smoking status: Passive Smoke Exposure - Never Smoker  . Smokeless tobacco: Never Used  Substance and Sexual Activity  . Alcohol use: Yes    Alcohol/week: 0.0 standard drinks    Comment: occas wine  . Drug use: No  . Sexual activity: Not on file  Lifestyle  . Physical activity:    Days per week: Not on file    Minutes per session: Not on file  . Stress: Not on file  Relationships  . Social connections:    Talks on phone: Not on file    Gets together: Not on file    Attends religious service: Not on file    Active member of club or organization: Not on file    Attends meetings of clubs or organizations: Not on file    Relationship status: Not on file  . Intimate partner violence:    Fear of current or ex partner: Not on file    Emotionally abused: Not on file    Physically abused: Not on file    Forced sexual activity: Not on file  Other Topics Concern  . Not on file  Social History Narrative   Married, lives with spouse and occ kid when back at home. 3 grown kids. 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  She is walking around the halls and does not feel short of breath. ENT:  No nose bleeds Pulm: No cough, no dyspnea. CV:  No palpitations, no LE edema.  GU:  No hematuria, no frequency GI: Per HPI. Heme: No excessive or unusual bleeding or bruising. Transfusions: Remotely received transfusions for unclear reasons. Neuro:  No headaches, no peripheral tingling or numbness Derm:  No itching, no rash or sores.  Endocrine:  No sweats or chills.  No polyuria or dysuria Immunization: Vaccinations reviewed. Travel:  None beyond local counties in last few months.  PHYSICAL EXAM: Vital signs in last 24 hours: Vitals:   11/28/18 2122 11/29/18 0545  BP: 132/72 132/76  Pulse: 66 75  Resp: 18 18  Temp: 98.2 F (36.8 C) 98.6 F (37 C)  SpO2: 100% 96%   Wt Readings from Last 3 Encounters:   11/26/18 87.5 kg  11/13/18 88.5 kg  07/07/10 99.3 kg    General: Pleasant, comfortable, non-ill appearing. Head: No facial asymmetry or swelling.  No signs of head trauma. Eyes: No scleral icterus.  No conjunctival pallor.  EOMI. Ears: Good hearing. Nose: No discharge or congestion Mouth: Tongue midline.  Oral mucosa moist, pink, clear.  Good dentition. Neck: No JVD, no masses, no thyromegaly. Lungs: No labored breathing or cough.  Lungs clear with good breath sounds bilaterally. Heart: RRR.  No MRG.  S1, S2 present Abdomen: Non-distended.  Active bowel sounds.  Tender over JP drain site.  Bandage covering JP drain site was not removed.  It is clean and dry.  Other surgical incisions are intact and benign appearing.   Rectal: Deferred Musc/Skeltl: No joint redness, swelling, gross deformity. Extremities: No CCE. Neurologic: Oriented x3.  Great historian.  Moves all 4 limbs without weakness or tremors. Skin: No sores, rashes or suspicious lesions. Tattoos: None Nodes: No cervical adenopathy. Psych: Calm, pleasant, intelligent, cooperative.  Intake/Output from previous day: 06/02 0701 - 06/03 0700 In: 720 [P.O.:720] Out: 160 [Drains:160] Intake/Output this shift: No intake/output data recorded.  LAB RESULTS: Recent Labs    11/27/18 0440 11/28/18 0520 11/29/18 0437  WBC 12.3* 11.1* 7.6  HGB 9.0* 8.5* 8.7*  HCT 27.7* 26.0* 26.3*  PLT 208 225 249   BMET Lab Results  Component Value Date   NA 142 11/29/2018   NA 137 11/27/2018   NA 140 11/26/2018   K 3.1 (L) 11/29/2018   K 3.6 11/27/2018   K 3.0 (L) 11/26/2018   CL 103 11/29/2018   CL 101 11/27/2018   CL 104 11/26/2018   CO2 27 11/29/2018   CO2 24 11/27/2018   CO2 25 11/26/2018   GLUCOSE 112 (H) 11/29/2018   GLUCOSE 175 (H) 11/27/2018   GLUCOSE 105 (H) 11/26/2018   BUN 7 (L) 11/29/2018   BUN 5 (L) 11/27/2018   BUN 5 (L) 11/26/2018   CREATININE 0.84 11/29/2018   CREATININE 0.88 11/27/2018   CREATININE 0.94  11/26/2018   CALCIUM 8.4 (L) 11/29/2018   CALCIUM 8.4 (L) 11/27/2018   CALCIUM 8.3 (L) 11/26/2018   LFT Recent Labs    11/27/18 0440 11/29/18 0437  PROT 6.1* 5.8*  ALBUMIN 2.4* 2.3*  AST 44* 17  ALT 28 17  ALKPHOS 113 91  BILITOT 0.8 0.9   Lipase     Component Value Date/Time   LIPASE 21 11/25/2018 1530     RADIOLOGY STUDIES: No results found.    IMPRESSION:   *   Suspected bile leak following subtotal cholecystectomy.    *   Subtotal cholecystectomy 11/26/18  *   E coli bacteremia.  On Rocephin.    *   Choledocholithiasis.  S/p ERCP/sphinct/stone removal 11/17/18.    *    Normocytic anemia.  Hgb baseline ~ 11.5, now 8.7.    *    Hypokalemia.     PLAN:     *  ERCP tomorrow at 12:30 PM.  Npo after MN for this.    *   Check drainage fluid for bile level.      Azucena Freed  11/29/2018, 9:44 AM Phone  [phone]

## 2018-11-29 NOTE — Progress Notes (Addendum)
3 Days Post-Op  Subjective: CC: Abdominal pain Patient reports continued pain around drain site. Reports that her drainage became darker and increased overnight from JP drain. Tolerating diet without N/V or increase in pain. Passing flatus and had another episode of diarrhea yesterday. She is mobilizing in halls.   Objective: Vital signs in last 24 hours: Temp:  [97.9 F (36.6 C)-98.6 F (37 C)] 98.6 F (37 C) (06/03 0545) Pulse Rate:  [66-75] 75 (06/03 0545) Resp:  [16-18] 18 (06/03 0545) BP: (132-143)/(71-76) 132/76 (06/03 0545) SpO2:  [96 %-100 %] 96 % (06/03 0545) Last BM Date: 11/28/18  Intake/Output from previous day: 06/02 0701 - 06/03 0700 In: 720 [P.O.:720] Out: 160 [Drains:160] Intake/Output this shift: No intake/output data recorded.  PE: Gen: Awake and alert, NAD Lungs: Normal effort Abd: Soft, ND, tenderness around drain site and mild tenderness of the RUQ. No rigidity or gaurding. +BS. JP drain with 75 cc of bilious output. 160 recorded from overnight. Drain sponge replaced.  Msk: No edema   Lab Results:  Recent Labs    11/28/18 0520 11/29/18 0437  WBC 11.1* 7.6  HGB 8.5* 8.7*  HCT 26.0* 26.3*  PLT 225 249   BMET Recent Labs    11/27/18 0440 11/29/18 0437  NA 137 142  K 3.6 3.1*  CL 101 103  CO2 24 27  GLUCOSE 175* 112*  BUN 5* 7*  CREATININE 0.88 0.84  CALCIUM 8.4* 8.4*   PT/INR No results for input(s): LABPROT, INR in the last 72 hours. CMP     Component Value Date/Time   NA 142 11/29/2018 0437   K 3.1 (L) 11/29/2018 0437   CL 103 11/29/2018 0437   CO2 27 11/29/2018 0437   GLUCOSE 112 (H) 11/29/2018 0437   BUN 7 (L) 11/29/2018 0437   CREATININE 0.84 11/29/2018 0437   CREATININE 0.86 03/24/2016 1635   CALCIUM 8.4 (L) 11/29/2018 0437   PROT 5.8 (L) 11/29/2018 0437   ALBUMIN 2.3 (L) 11/29/2018 0437   AST 17 11/29/2018 0437   ALT 17 11/29/2018 0437   ALKPHOS 91 11/29/2018 0437   BILITOT 0.9 11/29/2018 0437   GFRNONAA >60  11/29/2018 0437   GFRAA >60 11/29/2018 0437   Lipase     Component Value Date/Time   LIPASE 21 11/25/2018 1530       Studies/Results: No results found.  Anti-infectives: Anti-infectives (From admission, onward)   Start     Dose/Rate Route Frequency Ordered Stop   11/27/18 1400  cefTRIAXone (ROCEPHIN) 2 g in sodium chloride 0.9 % 100 mL IVPB     2 g 200 mL/hr over 30 Minutes Intravenous Every 24 hours 11/27/18 1023     11/26/18 1615  piperacillin-tazobactam (ZOSYN) IVPB 3.375 g     3.375 g 100 mL/hr over 30 Minutes Intravenous  Once 11/26/18 1608 11/26/18 1653   11/25/18 2200  piperacillin-tazobactam (ZOSYN) IVPB 3.375 g  Status:  Discontinued     3.375 g 12.5 mL/hr over 240 Minutes Intravenous Every 8 hours 11/25/18 2044 11/27/18 1023   11/25/18 1545  piperacillin-tazobactam (ZOSYN) IVPB 3.375 g     3.375 g 100 mL/hr over 30 Minutes Intravenous  Once 11/25/18 1534 11/25/18 1635       Assessment/Plan  HTN HLD  E. Coli Bacteremia Acute Calculous Cholecystitis  S/p ERCP/spincterotomy for choledocholithiasis - 5/22, Dr. Henrene Pastor S/pLaparoscopic Fenestrated Subtotal Cholecystectomy, Dr. Donne Hazel, 11/26/2018 POD #3 -GB was noted to have purulence in surgery and was necrotic -T bili & LFTS  normalized - Surgical path - chronic cholecystitis  - Continue abx for at least 5 days. This was narrowed to Rocephin by TRH based on Cx report.  - Ambulate and IS - Patient with bile out of drain this morning and increased output. WBC and LFTS wnl. Will make NPO and call [facility]  FEN -NPO VTE -SCDs, Lovenox. Hgb 9.9>9.0>8.5>8.7. VSS ID -Zosyn 5/30-6/1. Rocephin 6/1 >> (day 3). WBC 12.3>11.1>7.6 Foley - None Follow up - Dr. Donne Hazel    LOS: 4 days    Jillyn Ledger , [facility] 11/29/2018, 9:11 AM Pager: [phone]

## 2018-11-29 NOTE — Progress Notes (Signed)
PROGRESS NOTE    Janice Rodriguez  KYH:[id number] DOB: 25-Nov-1955 DOA: 11/25/2018 PCP: Janice Morning, DO   Brief Narrative:    Janice Rodriguez a 63 y.o.femalewith medical history significant forhypertensionwho has been having intermittent right upper quadrant abdominal pain which radiates to her back for 3 to 4 weeks. She has been evaluated by her GI physician, Dr. Henrene Pastor. She had abdominal ultrasound on 11/13/2018 which showed a dilated common bile duct. She was started on oral ciprofloxacin. She underwent ERCP on 11/17/2018 was found to have choledocholithiasis status post ERCP with sphincterotomy and successful bile duct stone extraction.Since her ERCP, she has had continued intermittent mild right upper quadrant pain which has been 6-7 out of 10 at its most intense with continued radiation to her back. Pain has been well controlled with as needed Tylenol. This Rodriguez she developed shaking chills and fever at home. She had nausea with one episode of bilious emesis. Due to worsening symptoms she presented to the ED for further evaluation.   Assessment & Plan:   Principal Problem:   Cholangitis Active Problems:   Essential hypertension   AKI (acute kidney injury) ([place])   Bile leak   Acute calculus cholecystitis/cholangitis s/p lap cholecystectomy with intraoperative cholangiogram and drain placement.  Further recommendations as per general surgery. As per the patient the color and the consistency of the drain has changed concerning for a bile leak. Plan for ERCP by GI tomorrow to see if she needs stent placement.  N.p.o. after midnight.   E. coli bacteremia likely from GI source .  Blood cultures show growing E. coli from 11/25/2018 sample.  Patient currently on Rocephin and a repeat blood cultures are negative so far.  Plan to continue 2 weeks of antibiotics.   Leukocytosis improving patient remains afebrile.   Hypokalemia Replaced.   AKI probably from  prerenal causes Improved with hydration.  Continue monitor the creatinine.    Chronic anemia of blood loss status post surgery/anemia of chronic disease. Transfuse to keep hemoglobin greater than 7.   Essential hypertension Well-controlled Resume home medications.Marland Kitchen    GERD continue with PPI     DVT prophylaxis: SCDs Code Status: Full code Family Communication: None at bedside, discussed the plan with the patient Disposition Plan: Pending further management and clinical improvement.   Consultants:   General surgery  Gastroenterology  Procedures:  Lap cholecystectomy, intraoperative cholangiogram.  ERCP scheduled for tomorrow  Antimicrobials: rocephin since admission.   Subjective: Reports tender ness at the site of the drain.  Color and consistency of the fluid changed since last night asper the patient.   Objective: Vitals:   11/28/18 1336 11/28/18 2122 11/29/18 0545 11/29/18 1309  BP: (!) 143/71 132/72 132/76 (!) 146/81  Pulse: 69 66 75 77  Resp: 16 18 18 18   Temp: 97.9 F (36.6 C) 98.2 F (36.8 C) 98.6 F (37 C) 98.2 F (36.8 C)  TempSrc: Oral Oral Oral Oral  SpO2: 100% 100% 96% 100%  Weight:      Height:        Intake/Output Summary (Last 24 hours) at 11/29/2018 1754 Last data filed at 11/29/2018 1000 Gross per 24 hour  Intake -  Output 280 ml  Net -280 ml   Filed Weights   11/25/18 2210 11/26/18 0945  Weight: 87.5 kg 87.5 kg    Examination:  General exam: Appears calm and comfortable  Respiratory system: Clear to auscultation. Respiratory effort normal. Cardiovascular system: S1 & S2 heard, RRR Gastrointestinal system: Abdomen is soft ,  non distended.  tender at the site of the drain. JP drain in place with dark liquid. Central nervous system: Alert and oriented. No focal neurological deficits. Extremities: Symmetric 5 x 5 power. Skin: No rashes, lesions or ulcers Psychiatry:  Mood & affect appropriate.     Data Reviewed: I have  personally reviewed following labs and imaging studies  CBC: Recent Labs  Lab 11/25/18 1530 11/26/18 0321 11/27/18 0440 11/28/18 0520 11/29/18 0437  WBC 14.5* 10.0 12.3* 11.1* 7.6  NEUTROABS 12.4*  --  10.5* 7.6  --   HGB 11.5* 9.9* 9.0* 8.5* 8.7*  HCT 35.4* 29.9* 27.7* 26.0* 26.3*  MCV 81.2 80.2 81.0 80.2 81.4  PLT 264 220 208 225 756   Basic Metabolic Panel: Recent Labs  Lab 11/25/18 1530 11/26/18 0321 11/27/18 0440 11/29/18 0437  NA 138 140 137 142  K 3.5 3.0* 3.6 3.1*  CL 100 104 101 103  CO2 25 25 24 27   GLUCOSE 131* 105* 175* 112*  BUN 9 5* 5* 7*  CREATININE 1.44* 0.94 0.88 0.84  CALCIUM 9.0 8.3* 8.4* 8.4*   GFR: Estimated Creatinine Clearance: 74.4 mL/min (by C-G formula based on SCr of 0.84 mg/dL). Liver Function Tests: Recent Labs  Lab 11/25/18 1530 11/26/18 0321 11/27/18 0440 11/29/18 0437  AST 19 16 44* 17  ALT 16 13 28 17   ALKPHOS 160* 132* 113 91  BILITOT 1.7* 1.4* 0.8 0.9  PROT 7.5 6.1* 6.1* 5.8*  ALBUMIN 3.1* 2.6* 2.4* 2.3*   Recent Labs  Lab 11/25/18 1530  LIPASE 21   No results for input(s): AMMONIA in the last 168 hours. Coagulation Profile: No results for input(s): INR, PROTIME in the last 168 hours. Cardiac Enzymes: No results for input(s): CKTOTAL, CKMB, CKMBINDEX, TROPONINI in the last 168 hours. BNP (last 3 results) No results for input(s): PROBNP in the last 8760 hours. HbA1C: No results for input(s): HGBA1C in the last 72 hours. CBG: No results for input(s): GLUCAP in the last 168 hours. Lipid Profile: No results for input(s): CHOL, HDL, LDLCALC, TRIG, CHOLHDL, LDLDIRECT in the last 72 hours. Thyroid Function Tests: No results for input(s): TSH, T4TOTAL, FREET4, T3FREE, THYROIDAB in the last 72 hours. Anemia Panel: No results for input(s): VITAMINB12, FOLATE, FERRITIN, TIBC, IRON, RETICCTPCT in the last 72 hours. Sepsis Labs: Recent Labs  Lab 11/25/18 1530  LATICACIDVEN 1.2    Recent Results (from the past 240  hour(s))  Culture, blood (routine x 2)     Status: Abnormal   Collection Time: 11/25/18  3:30 PM  Result Value Ref Range Status   Specimen Description BLOOD RIGHT ARM  Final   Special Requests   Final    BOTTLES DRAWN AEROBIC AND ANAEROBIC Blood Culture results may not be optimal due to an inadequate volume of blood received in culture bottles   Culture  Setup Time   Final    GRAM NEGATIVE RODS IN BOTH AEROBIC AND ANAEROBIC BOTTLES CRITICAL VALUE NOTED.  VALUE IS CONSISTENT WITH PREVIOUSLY REPORTED AND CALLED VALUE.    Culture (A)  Final    ESCHERICHIA COLI SUSCEPTIBILITIES PERFORMED ON PREVIOUS CULTURE WITHIN THE LAST 5 DAYS. Performed at [facility], [place] [address] [place], [place] [address]    Report Status 11/28/2018 FINAL  Final  Culture, blood (routine x 2)     Status: Abnormal   Collection Time: 11/25/18  3:30 PM  Result Value Ref Range Status   Specimen Description BLOOD LEFT ARM  Final   Special  Requests   Final    BOTTLES DRAWN AEROBIC AND ANAEROBIC Blood Culture results may not be optimal due to an inadequate volume of blood received in culture bottles   Culture  Setup Time   Final    IN BOTH AEROBIC AND ANAEROBIC BOTTLES GRAM NEGATIVE RODS CRITICAL RESULT CALLED TO, READ BACK BY AND VERIFIED WITH: [facility] [place] [id number] FCP Performed at [place] [address] [place], [place] [address]    Culture ESCHERICHIA COLI (A)  Final   Report Status 11/28/2018 FINAL  Final   Organism ID, Bacteria ESCHERICHIA COLI  Final      Susceptibility   Escherichia coli - MIC*    AMPICILLIN >=32 RESISTANT Resistant     CEFAZOLIN 32 INTERMEDIATE Intermediate     CEFEPIME <=1 SENSITIVE Sensitive     CEFTAZIDIME <=1 SENSITIVE Sensitive     CEFTRIAXONE <=1 SENSITIVE Sensitive     CIPROFLOXACIN >=4 RESISTANT Resistant     GENTAMICIN <=1 SENSITIVE Sensitive     IMIPENEM <=0.25 SENSITIVE Sensitive     TRIMETH/SULFA <=20 SENSITIVE Sensitive     AMPICILLIN/SULBACTAM  >=32 RESISTANT Resistant     Extended ESBL NEGATIVE Sensitive     * ESCHERICHIA COLI  Blood Culture ID Panel (Reflexed)     Status: Abnormal   Collection Time: 11/25/18  3:30 PM  Result Value Ref Range Status   Enterococcus species NOT DETECTED NOT DETECTED Final   Listeria monocytogenes NOT DETECTED NOT DETECTED Final   Staphylococcus species NOT DETECTED NOT DETECTED Final   Staphylococcus aureus (BCID) NOT DETECTED NOT DETECTED Final   Streptococcus species NOT DETECTED NOT DETECTED Final   Streptococcus agalactiae NOT DETECTED NOT DETECTED Final   Streptococcus pneumoniae NOT DETECTED NOT DETECTED Final   Streptococcus pyogenes NOT DETECTED NOT DETECTED Final   Acinetobacter baumannii NOT DETECTED NOT DETECTED Final   Enterobacteriaceae species DETECTED (A) NOT DETECTED Final    Comment: Enterobacteriaceae represent a large family of gram-negative bacteria, not a single organism. CRITICAL RESULT CALLED TO, READ BACK BY AND VERIFIED WITH: PHARMD [place] [id number] FCP    Enterobacter cloacae complex NOT DETECTED NOT DETECTED Final   Escherichia coli DETECTED (A) NOT DETECTED Final    Comment: CRITICAL RESULT CALLED TO, READ BACK BY AND VERIFIED WITH: PHARMD MACC[place] [id number] FCP    Klebsiella oxytoca NOT DETECTED NOT DETECTED Final   Klebsiella pneumoniae NOT DETECTED NOT DETECTED Final   Proteus species NOT DETECTED NOT DETECTED Final   Serratia marcescens NOT DETECTED NOT DETECTED Final   Carbapenem resistance NOT DETECTED NOT DETECTED Final   Haemophilus influenzae NOT DETECTED NOT DETECTED Final   Neisseria meningitidis NOT DETECTED NOT DETECTED Final   Pseudomonas aeruginosa NOT DETECTED NOT DETECTED Final   Candida albicans NOT DETECTED NOT DETECTED Final   Candida glabrata NOT DETECTED NOT DETECTED Final   Candida krusei NOT DETECTED NOT DETECTED Final   Candida parapsilosis NOT DETECTED NOT DETECTED Final   Candida tropicalis NOT DETECTED NOT DETECTED Final     Comment: Performed at [facility], [place] [address] [place], [place] [address]  SARS Coronavirus 2 (CEPHEID - Performed in [facility]), Hosp Order     Status: None   Collection Time: 11/25/18  4:50 PM  Result Value Ref Range Status   SARS Coronavirus 2 NEGATIVE NEGATIVE Final    Comment: (NOTE) If result is NEGATIVE SARS-CoV-2 target nucleic acids are NOT DETECTED. The SARS-CoV-2 RNA is generally detectable in  upper and lower  respiratory specimens during the acute phase of infection. The lowest  concentration of SARS-CoV-2 viral copies this assay can detect is 250  copies / mL. A negative result does not preclude SARS-CoV-2 infection  and should not be used as the sole basis for treatment or other  patient management decisions.  A negative result may occur with  improper specimen collection / handling, submission of specimen other  than nasopharyngeal swab, presence of viral mutation(s) within the  areas targeted by this assay, and inadequate number of viral copies  (<250 copies / mL). A negative result must be combined with clinical  observations, patient history, and epidemiological information. If result is POSITIVE SARS-CoV-2 target nucleic acids are DETECTED. The SARS-CoV-2 RNA is generally detectable in upper and lower  respiratory specimens dur ing the acute phase of infection.  Positive  results are indicative of active infection with SARS-CoV-2.  Clinical  correlation with patient history and other diagnostic information is  necessary to determine patient infection status.  Positive results do  not rule out bacterial infection or co-infection with other viruses. If result is PRESUMPTIVE POSTIVE SARS-CoV-2 nucleic acids MAY BE PRESENT.   A presumptive positive result was obtained on the submitted specimen  and confirmed on repeat testing.  While 2019 novel coronavirus  (SARS-CoV-2) nucleic acids may be present in the submitted sample  additional  confirmatory testing may be necessary for epidemiological  and / or clinical management purposes  to differentiate between  SARS-CoV-2 and other Sarbecovirus currently known to infect humans.  If clinically indicated additional testing with an alternate test  methodology [phone]) is advised. The SARS-CoV-2 RNA is generally  detectable in upper and lower respiratory sp ecimens during the acute  phase of infection. The expected result is Negative. Fact Sheet for Patients:  StrictlyIdeas.no Fact Sheet for Healthcare Providers: BankingDealers.co.za This test is not yet approved or cleared by the [place] FDA and has been authorized for detection and/or diagnosis of SARS-CoV-2 by FDA under an Emergency Use Authorization (EUA).  This EUA will remain in effect (meaning this test can be used) for the duration of the COVID-19 declaration under Section 564(b)(1) of the Act, 21 U.S.C. section 360bbb-3(b)(1), unless the authorization is terminated or revoked sooner. Performed at [facility], [place] [address] [place], [place] [address]   Surgical pcr screen     Status: None   Collection Time: 11/26/18 12:26 AM  Result Value Ref Range Status   MRSA, PCR NEGATIVE NEGATIVE Final   Staphylococcus aureus NEGATIVE NEGATIVE Final    Comment: (NOTE) The Xpert SA Assay (FDA approved for NASAL specimens in patients 59 years of age and older), is one component of a comprehensive surveillance program. It is not intended to diagnose infection nor to guide or monitor treatment. Performed at [facility], [place] [address] [place], [place] [address]   Culture, blood (routine x 2)     Status: None (Preliminary result)   Collection Time: 11/27/18  4:35 AM  Result Value Ref Range Status   Specimen Description BLOOD LEFT ANTECUBITAL  Final   Special Requests   Final    BOTTLES DRAWN AEROBIC ONLY Blood Culture adequate volume   Culture   Final     NO GROWTH 2 DAYS Performed at [facility], [place] [address] [place], [place] [address]    Report Status PENDING  Incomplete  Culture, blood (routine x 2)     Status: None (Preliminary result)  Collection Time: 11/27/18  4:40 AM  Result Value Ref Range Status   Specimen Description BLOOD LEFT HAND  Final   Special Requests   Final    BOTTLES DRAWN AEROBIC ONLY Blood Culture adequate volume   Culture   Final    NO GROWTH 2 DAYS Performed at [facility], [address] [place], [place] [address]    Report Status PENDING  Incomplete         Radiology Studies: No results found.      Scheduled Meds: . acidophilus  1 capsule Oral Once per day on Mon Wed Fri  . cholecalciferol  1,000 Units Oral Daily  . hydrochlorothiazide  12.5 mg Oral q Rodriguez - 10a  . irbesartan  150 mg Oral Daily  . metoprolol succinate  75 mg Oral Daily  . pantoprazole  40 mg Oral Daily  . senna-docusate  1 tablet Oral QHS  . vitamin C  500 mg Oral Daily   Continuous Infusions: . cefTRIAXone (ROCEPHIN)  IV 2 g (11/29/18 1332)     LOS: 4 days    Time spent: 38 minutes.     Hosie Poisson, MD [facility] Pager [phone]  If 7PM-7AM, please contact night-coverage [email or web address] Password [facility] 11/29/2018, 5:54 PM

## 2018-11-29 NOTE — Consult Note (Signed)
[facility] Consult: 9:44 AM 11/29/2018  LOS: 4 days    Referring Provider: Dr   Primary Care Physician:  [name], DO Primary Gastroenterologist:  Dr. Henrene Pastor.     Reason for Consultation:  Bile leak post partial cholecystectomy?   HPI: Janice Rodriguez is a 63 y.o. female.  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    PMH GERD.  Esophageal stricture.  Obesity.  Umbilical hernia repair age 89.  Vaginal hysterectomy 1992. 2017 colonoscopy: Diverticulosis. 2017 EGD: Mild esophagitis, esophageal stricture.  Visit with Dr Henrene Pastor 11/02/18 for intermittent abd pain, n/v, and shaking chills.  He suspected GB stones, RXd her with Cipro and arranged for: 11/13/18 abdominal ultrasound:   Gallbladder contracted, gallstones and gallbladder adenomyomatosis present.  Intra-and extrahepatic biliary ductal dilatation, CBD 14 mm.  Unable to visualize distal CBD, cannot exclude distal CBD stone. 11/14/18 labs with t bili 2.2. alk phos 396, AST/ALT 137/108.  11/17/2018 ERCP.  Choledocholithiasis.  S/p sphincterotomy and extraction of bile duct stone.  Cholelthiasis.   Admitted 11/25/2018 with symptomatic cholecystitis,  cholangitis.  She felt okay initially after ERCP but began having having abdominal pain and later fevers and vomiting within a couple of days of the procedure. CTAP showed gallbladder wall thickening, stranding RUQ abutting duodenum, GB, pancreatic head.  WBCs 14.5. Normal Lipase, improved elevation LFTs.  Dehydrated with now resolved mild AKI.   Grew E coli from 2/2 blood clxs.    11/26/2018 "fenestrating", subtotal lap chole.   Dr. Donne Hazel.  GB necrotic, partially removed and Blake drain placed.  Continues on Rocephin.  Pain at drain site continues, drain output is darker today, it was more  sanguinous until today,  Increasing volume.  Output over previous 3 days: 155, 50, 160 ml.  Is tolerating diet and walking in hall.  Watery stool when she eats. Pain is only at drain site.     Overall has lost close to 20 pounds over the last few months.  Had been complaining of dysphagia when seen by Dr. Henrene Pastor a month ago, this is not currently an issue.  Family history: Breast cancer in mother.  Colon cancer in father and paternal aunt.  Heart disease, diabetes, hyperlipidemia, hypertension, sarcoidosis, stroke in first-degree relatives  Past Medical History:  Diagnosis Date  . ASYMPTOMATIC POSTMENOPAUSAL STATUS   . Blood transfusion without reported diagnosis 1980's  . Female cystocele   . Gall stones, common bile duct    dialated bile duct per [place] called today 11-13-2018  . GERD   . History of irregular heartbeat   . HYPERLIPIDEMIA   . HYPERTENSION   . Left atrial enlargement 12/2013   Noted on ECHO  . LVH (left ventricular hypertrophy) 12/2013   Mild, noted on ECHO  . OBESITY   . SCOLIOSIS   . 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 UMBILICAL HERNIORRHAPHY, HX OF    age 48  . Vitamin D deficiency   . Wears glasses  Past Surgical History:  Procedure Laterality Date  . CHOLECYSTECTOMY N/A 11/26/2018   Procedure: LAPAROSCOPIC CHOLECYSTECTOMY WITH INTRAOPERATIVE CHOLANGIOGRAM;  Surgeon: Rolm Bookbinder, MD;  Location: [facility];  Service: General;  Laterality: N/A;  . COLONOSCOPY  2012   T[place]  . COLONOSCOPY  10/2015  . [place]  . ENDOSCOPIC RETROGRADE CHOLANGIOPANCREATOGRAPHY (ERCP) WITH PROPOFOL N/A 11/17/2018   Procedure: ENDOSCOPIC RETROGRADE CHOLANGIOPANCREATOGRAPHY (ERCP) WITH PROPOFOL;  Surgeon: Irene Shipper, MD;  Location: [facility];  Service: Endoscopy;  Laterality: N/A;  . REMOVAL OF STONES  11/17/2018   Procedure: REMOVAL OF STONES;  Surgeon: Irene Shipper, MD;  Location: [facility];  Service: Endoscopy;;  . [name]  11/17/2018   Procedure: SPHINCTEROTOMY;  Surgeon: Irene Shipper, MD;  Location: [facility];  Service: Endoscopy;;  . [place]  . UPPER GASTROINTESTINAL ENDOSCOPY  10/2015  . VAGINAL HYSTERECTOMY  1992    Prior to Admission medications   Medication Sig Start Date End Date Taking? Authorizing Provider  acetaminophen (TYLENOL) 500 MG tablet Take 1,000 mg by mouth every 6 (six) hours as needed (pain).   Yes [provider]  aspirin EC 81 MG tablet Take 81 mg by mouth daily.   Yes [provider]  Calcium Carbonate Antacid (TUMS E-X PO) Take 1 tablet by mouth daily as needed (indigestion/ acid reflux).   Yes [provider]  candesartan (ATACAND) 16 MG tablet TAKE 1 TABLET BY MOUTH ONCE DAILY Patient taking differently: Take 16 mg by mouth daily at 12 noon.  06/12/18  Yes Belva Crome, MD  Cholecalciferol (VITAMIN D3 PO) Take 1 tablet by mouth daily.    Yes [provider]  esomeprazole (NEXIUM) 40 MG capsule Take 1 capsule (40 mg total) by mouth 2 (two) times daily before a meal. Patient taking differently: Take 40 mg by mouth every evening.  11/02/18  Yes Irene Shipper, MD  hydrochlorothiazide (MICROZIDE) 12.5 MG capsule Take 1 capsule (12.5 mg total) by mouth every morning. 04/18/18  Yes Belva Crome, MD  metoprolol succinate (TOPROL-XL) 50 MG 24 hr tablet Take 1.5 tablets (75 mg) by mouth daily with or immediately following a meal. Patient taking differently: Take 75 mg by mouth daily with breakfast.  04/18/18  Yes Belva Crome, MD  potassium chloride SA (K-DUR,KLOR-CON) 20 MEQ tablet Take 1 tablet (20 mEq total) by mouth daily. 04/18/18  Yes Belva Crome, MD  Probiotic Product (PROBIOTIC DAILY PO) Take 1 capsule by mouth 3 (three) times a week. Reported on 11/21/2015   Yes [provider]  vitamin C (ASCORBIC ACID) 500 MG tablet Take 500 mg by  mouth daily.   Yes [provider]  ciprofloxacin (CIPRO) 500 MG tablet Take 1 tablet (500 mg total) by mouth 2 (two) times daily. Go ahead and take 2 doses today Patient not taking: Reported on 11/25/2018 11/14/18   Irene Shipper, MD    Scheduled Meds: . acidophilus  1 capsule Oral Once per day on Mon Wed Fri  . cholecalciferol  1,000 Units Oral Daily  . enoxaparin (LOVENOX) injection  40 mg Subcutaneous Q24H  . hydrochlorothiazide  12.5 mg Oral q morning - 10a  . irbesartan  150 mg Oral Daily  . metoprolol succinate  75 mg Oral Daily  . pantoprazole  40 mg Oral Daily  . senna-docusate  1 tablet Oral QHS  . vitamin C  500 mg Oral Daily   Infusions: . cefTRIAXone (  ROCEPHIN)  IV 2 g (11/28/18 1339)   PRN Meds: acetaminophen **OR** acetaminophen, diphenhydrAMINE, hydrALAZINE, morphine injection, ondansetron **OR** ondansetron (ZOFRAN) IV, oxyCODONE, traMADol   Allergies as of 11/25/2018  . (No Known Allergies)    Family History  Problem Relation Age of Onset  . Breast cancer Mother   . Diabetes Mother 34  . Stroke Father   . Heart disease Father   . Colon cancer Father        not sure of age dx  . Stroke Brother   . Diabetes Brother   . Sarcoidosis Brother   . Colon cancer Paternal Aunt   . Diabetes Other   . Hypertension Other   . Hyperlipidemia Other   . Heart attack Brother   . Esophageal cancer Neg Hx   . Pancreatic cancer Neg Hx   . Rectal cancer Neg Hx   . Stomach cancer Neg Hx   . Colon polyps Neg Hx     Social History   Socioeconomic History  . Marital status: Married    Spouse name: Purcell Nails  . Number of children: Not on file  . Years of education: Not on file  . Highest education level: Not on file  Occupational History  . Not on file  Social Needs  . Financial resource strain: Not on file  . Food insecurity:    Worry: Not on file    Inability: Not on file  . Transportation needs:    Medical: Not on file    Non-medical: Not on file   Tobacco Use  . Smoking status: Passive Smoke Exposure - Never Smoker  . Smokeless tobacco: Never Used  Substance and Sexual Activity  . Alcohol use: Yes    Alcohol/week: 0.0 standard drinks    Comment: occas wine  . Drug use: No  . Sexual activity: Not on file  Lifestyle  . Physical activity:    Days per week: Not on file    Minutes per session: Not on file  . Stress: Not on file  Relationships  . Social connections:    Talks on phone: Not on file    Gets together: Not on file    Attends religious service: Not on file    Active member of club or organization: Not on file    Attends meetings of clubs or organizations: Not on file    Relationship status: Not on file  . Intimate partner violence:    Fear of current or ex partner: Not on file    Emotionally abused: Not on file    Physically abused: Not on file    Forced sexual activity: Not on file  Other Topics Concern  . Not on file  Social History Narrative   Married, lives with spouse and occ kid when back at home. 3 grown kids. 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  She is walking around the halls and does not feel short of breath. ENT:  No nose bleeds Pulm: No cough, no dyspnea. CV:  No palpitations, no LE edema.  GU:  No hematuria, no frequency GI: Per HPI. Heme: No excessive or unusual bleeding or bruising. Transfusions: Remotely received transfusions for unclear reasons. Neuro:  No headaches, no peripheral tingling or numbness Derm:  No itching, no rash or sores.  Endocrine:  No sweats or chills.  No polyuria or dysuria Immunization: Vaccinations reviewed. Travel:  None beyond local counties in last few months.  PHYSICAL EXAM: Vital signs in last 24 hours: Vitals:   11/28/18 2122 11/29/18 0545  BP: 132/72 132/76  Pulse: 66 75  Resp: 18 18  Temp: 98.2 F (36.8 C) 98.6 F (37 C)  SpO2: 100% 96%   Wt Readings from Last 3 Encounters:   11/26/18 87.5 kg  11/13/18 88.5 kg  07/07/10 99.3 kg    General: Pleasant, comfortable, non-ill appearing. Head: No facial asymmetry or swelling.  No signs of head trauma. Eyes: No scleral icterus.  No conjunctival pallor.  EOMI. Ears: Good hearing. Nose: No discharge or congestion Mouth: Tongue midline.  Oral mucosa moist, pink, clear.  Good dentition. Neck: No JVD, no masses, no thyromegaly. Lungs: No labored breathing or cough.  Lungs clear with good breath sounds bilaterally. Heart: RRR.  No MRG.  S1, S2 present Abdomen: Non-distended.  Active bowel sounds.  Tender over JP drain site.  Bandage covering JP drain site was not removed.  It is clean and dry.  Other surgical incisions are intact and benign appearing.   Rectal: Deferred Musc/Skeltl: No joint redness, swelling, gross deformity. Extremities: No CCE. Neurologic: Oriented x3.  Great historian.  Moves all 4 limbs without weakness or tremors. Skin: No sores, rashes or suspicious lesions. Tattoos: None Nodes: No cervical adenopathy. Psych: Calm, pleasant, intelligent, cooperative.  Intake/Output from previous day: 06/02 0701 - 06/03 0700 In: 720 [P.O.:720] Out: 160 [Drains:160] Intake/Output this shift: No intake/output data recorded.  LAB RESULTS: Recent Labs    11/27/18 0440 11/28/18 0520 11/29/18 0437  WBC 12.3* 11.1* 7.6  HGB 9.0* 8.5* 8.7*  HCT 27.7* 26.0* 26.3*  PLT 208 225 249   BMET Lab Results  Component Value Date   NA 142 11/29/2018   NA 137 11/27/2018   NA 140 11/26/2018   K 3.1 (L) 11/29/2018   K 3.6 11/27/2018   K 3.0 (L) 11/26/2018   CL 103 11/29/2018   CL 101 11/27/2018   CL 104 11/26/2018   CO2 27 11/29/2018   CO2 24 11/27/2018   CO2 25 11/26/2018   GLUCOSE 112 (H) 11/29/2018   GLUCOSE 175 (H) 11/27/2018   GLUCOSE 105 (H) 11/26/2018   BUN 7 (L) 11/29/2018   BUN 5 (L) 11/27/2018   BUN 5 (L) 11/26/2018   CREATININE 0.84 11/29/2018   CREATININE 0.88 11/27/2018   CREATININE 0.94  11/26/2018   CALCIUM 8.4 (L) 11/29/2018   CALCIUM 8.4 (L) 11/27/2018   CALCIUM 8.3 (L) 11/26/2018   LFT Recent Labs    11/27/18 0440 11/29/18 0437  PROT 6.1* 5.8*  ALBUMIN 2.4* 2.3*  AST 44* 17  ALT 28 17  ALKPHOS 113 91  BILITOT 0.8 0.9   Lipase     Component Value Date/Time   LIPASE 21 11/25/2018 1530     RADIOLOGY STUDIES: No results found.    IMPRESSION:   *   Suspected bile leak following subtotal cholecystectomy.    *   Subtotal cholecystectomy 11/26/18  *   E coli bacteremia.  On Rocephin.    *   Choledocholithiasis.  S/p ERCP/sphinct/stone removal 11/17/18.    *    Normocytic anemia.  Hgb baseline ~ 11.5, now 8.7.    *    Hypokalemia.     PLAN:     *  ERCP tomorrow at 12:30 PM.  Npo after MN for this.    *   Check drainage fluid for bile level.      Azucena Freed  11/29/2018, 9:44 AM Phone  [phone]

## 2018-11-30 ENCOUNTER — Inpatient Hospital Stay (HOSPITAL_COMMUNITY): Payer: 59 | Admitting: Certified Registered"

## 2018-11-30 ENCOUNTER — Encounter (HOSPITAL_COMMUNITY): Payer: Self-pay | Admitting: Certified Registered"

## 2018-11-30 ENCOUNTER — Inpatient Hospital Stay (HOSPITAL_COMMUNITY): Payer: 59

## 2018-11-30 ENCOUNTER — Encounter (HOSPITAL_COMMUNITY)
Admission: EM | Disposition: A | Discharge status: Home Health | Payer: Self-pay | Source: Home / Self Care | Attending: Internal Medicine

## 2018-11-30 DIAGNOSIS — K8051 Calculus of bile duct without cholangitis or cholecystitis with obstruction: Secondary | ICD-10-CM

## 2018-11-30 DIAGNOSIS — Z9049 Acquired absence of other specified parts of digestive tract: Secondary | ICD-10-CM

## 2018-11-30 HISTORY — PX: ENDOSCOPIC RETROGRADE CHOLANGIOPANCREATOGRAPHY (ERCP) WITH PROPOFOL: SHX5810

## 2018-11-30 HISTORY — PX: BILIARY STENT PLACEMENT: SHX5538

## 2018-11-30 HISTORY — PX: REMOVAL OF STONES: SHX5545

## 2018-11-30 LAB — COMPREHENSIVE METABOLIC PANEL
ALT: 14 U/L (ref 0–44)
AST: 16 U/L (ref 15–41)
Albumin: 2.7 g/dL — ABNORMAL LOW (ref 3.5–5.0)
Alkaline Phosphatase: 98 U/L (ref 38–126)
Anion gap: 14 (ref 5–15)
BUN: 7 mg/dL — ABNORMAL LOW (ref 8–23)
CO2: 25 mmol/L (ref 22–32)
Calcium: 8.9 mg/dL (ref 8.9–10.3)
Chloride: 102 mmol/L (ref 98–111)
Creatinine, Ser: 0.92 mg/dL (ref 0.44–1.00)
GFR calc Af Amer: >60 mL/min (ref >60)
GFR calc non Af Amer: >60 mL/min (ref >60)
Glucose, Bld: 133 mg/dL — ABNORMAL HIGH (ref 70–99)
Potassium: 3.4 mmol/L — ABNORMAL LOW (ref 3.5–5.1)
Sodium: 141 mmol/L (ref 135–145)
Total Bilirubin: 0.7 mg/dL (ref 0.3–1.2)
Total Protein: 6.5 g/dL (ref 6.5–8.1)

## 2018-11-30 LAB — CBC
HCT: 29.7 % — ABNORMAL LOW (ref 36.0–46.0)
Hemoglobin: 9.6 g/dL — ABNORMAL LOW (ref 12.0–15.0)
MCH: 26.4 pg (ref 26.0–34.0)
MCHC: 32.3 g/dL (ref 30.0–36.0)
MCV: 81.6 fL (ref 80.0–100.0)
Platelets: 304 10*3/uL (ref 150–400)
RBC: 3.64 MIL/uL — ABNORMAL LOW (ref 3.87–5.11)
RDW: 16.6 % — ABNORMAL HIGH (ref 11.5–15.5)
WBC: 11.2 10*3/uL — ABNORMAL HIGH (ref 4.0–10.5)
nRBC: 0 % (ref 0.0–0.2)

## 2018-11-30 SURGERY — ENDOSCOPIC RETROGRADE CHOLANGIOPANCREATOGRAPHY (ERCP) WITH PROPOFOL
Anesthesia: General

## 2018-11-30 MED ORDER — MIDAZOLAM HCL 5 MG/5ML IJ SOLN
INTRAMUSCULAR | Status: DC | PRN
  Administered 2018-11-30: 2 mg via INTRAVENOUS

## 2018-11-30 MED ORDER — SODIUM CHLORIDE 0.9 % IV SOLN
INTRAVENOUS | Status: DC | PRN
  Administered 2018-11-30: 25 ug/min via INTRAVENOUS

## 2018-11-30 MED ORDER — SUCCINYLCHOLINE CHLORIDE 20 MG/ML IJ SOLN
INTRAMUSCULAR | Status: DC | PRN
  Administered 2018-11-30: 100 mg via INTRAVENOUS

## 2018-11-30 MED ORDER — INDOMETHACIN 50 MG RE SUPP
RECTAL | Status: AC
  Filled 2018-11-30: qty 2

## 2018-11-30 MED ORDER — DEXAMETHASONE SODIUM PHOSPHATE 10 MG/ML IJ SOLN
INTRAMUSCULAR | Status: DC | PRN
  Administered 2018-11-30: 10 mg via INTRAVENOUS

## 2018-11-30 MED ORDER — GLUCAGON HCL RDNA (DIAGNOSTIC) 1 MG IJ SOLR
INTRAMUSCULAR | Status: AC
  Filled 2018-11-30: qty 1

## 2018-11-30 MED ORDER — LACTATED RINGERS IV SOLN
INTRAVENOUS | Status: DC
  Administered 2018-11-30: 12:00:00 via INTRAVENOUS

## 2018-11-30 MED ORDER — LIDOCAINE 2% (20 MG/ML) 5 ML SYRINGE
INTRAMUSCULAR | Status: DC | PRN
  Administered 2018-11-30: 60 mg via INTRAVENOUS

## 2018-11-30 MED ORDER — ROCURONIUM BROMIDE 10 MG/ML (PF) SYRINGE
PREFILLED_SYRINGE | INTRAVENOUS | Status: DC | PRN
  Administered 2018-11-30: 40 mg via INTRAVENOUS

## 2018-11-30 MED ORDER — SODIUM CHLORIDE 0.9 % IV SOLN
INTRAVENOUS | Status: DC | PRN
  Administered 2018-11-30: 14:00:00

## 2018-11-30 MED ORDER — GLUCAGON HCL RDNA (DIAGNOSTIC) 1 MG IJ SOLR
INTRAMUSCULAR | Status: DC | PRN
  Administered 2018-11-30 (×2): 0.25 mg via INTRAVENOUS

## 2018-11-30 MED ORDER — LACTATED RINGERS IV SOLN
INTRAVENOUS | Status: DC | PRN
  Administered 2018-11-30: 13:00:00 via INTRAVENOUS

## 2018-11-30 MED ORDER — SUGAMMADEX SODIUM 200 MG/2ML IV SOLN
INTRAVENOUS | Status: DC | PRN
  Administered 2018-11-30: 200 mg via INTRAVENOUS

## 2018-11-30 MED ORDER — INDOMETHACIN 50 MG RE SUPP: 100.0000 mg | Freq: Once | RECTAL | Status: AC

## 2018-11-30 MED ORDER — PROPOFOL 10 MG/ML IV BOLUS
INTRAVENOUS | Status: DC | PRN
  Administered 2018-11-30: 130 mg via INTRAVENOUS

## 2018-11-30 MED ORDER — INDOMETHACIN 50 MG RE SUPP
RECTAL | Status: DC | PRN
  Administered 2018-11-30: 100 mg via RECTAL

## 2018-11-30 MED ORDER — FENTANYL CITRATE (PF) 250 MCG/5ML IJ SOLN
INTRAMUSCULAR | Status: DC | PRN
  Administered 2018-11-30: 100 ug via INTRAVENOUS

## 2018-11-30 MED ORDER — ONDANSETRON HCL 4 MG/2ML IJ SOLN
INTRAMUSCULAR | Status: DC | PRN
  Administered 2018-11-30: 4 mg via INTRAVENOUS

## 2018-11-30 NOTE — Anesthesia Postprocedure Evaluation (Signed)
Anesthesia Post Note  Patient: Janice Rodriguez  Procedure(s) Performed: ENDOSCOPIC RETROGRADE CHOLANGIOPANCREATOGRAPHY (ERCP) WITH PROPOFOL (N/A ) REMOVAL OF STONES BILIARY STENT PLACEMENT     Patient location during evaluation: PACU Anesthesia Type: General Level of consciousness: awake and alert Pain management: pain level controlled Vital Signs Assessment: post-procedure vital signs reviewed and stable Respiratory status: spontaneous breathing, nonlabored ventilation, respiratory function stable and patient connected to nasal cannula oxygen Cardiovascular status: blood pressure returned to baseline and stable Postop Assessment: no apparent nausea or vomiting Anesthetic complications: no    Last Vitals:  Vitals:   11/30/18 1418 11/30/18 1432  BP: (!) 144/65 139/75  Pulse: 74 71  Resp: (!) 21 18  Temp:  36.9 C  SpO2: 100% 97%    Last Pain:  Vitals:   11/30/18 1432  TempSrc: Oral  PainSc:                  [name]

## 2018-11-30 NOTE — Anesthesia Procedure Notes (Signed)
Procedure Name: Intubation Date/Time: 11/30/2018 1:02 PM Performed by: Gaylene Brooks, CRNA Pre-anesthesia Checklist: Patient identified, Emergency Drugs available, Suction available and Patient being monitored Patient Re-evaluated:Patient Re-evaluated prior to induction Oxygen Delivery Method: Circle System Utilized Preoxygenation: Pre-oxygenation with 100% oxygen Induction Type: IV induction and Rapid sequence Laryngoscope Size: Miller and 2 Grade View: Grade II Tube type: Oral Tube size: 7.0 mm Number of attempts: 1 Airway Equipment and Method: Stylet and Oral airway Placement Confirmation: ETT inserted through vocal cords under direct vision,  positive ETCO2 and breath sounds checked- equal and bilateral Secured at: 22 cm Tube secured with: Tape Dental Injury: Teeth and Oropharynx as per pre-operative assessment

## 2018-11-30 NOTE — Progress Notes (Signed)
4 Days Post-Op  Subjective: CC: Abdominal pain Patient reports that she began having some RUQ "spasms" this morning. Still having some pain around drain site. Patient reports tolerating diet yesterday without N/V or increase in abdominal pain. Now NPO for ERCP later today.   Objective: Vital signs in last 24 hours: Temp:  [98.1 F (36.7 C)-99.3 F (37.4 C)] 99.3 F (37.4 C) (06/04 0355) Pulse Rate:  [75-83] 83 (06/04 0355) Resp:  [14-18] 14 (06/04 0355) BP: (139-146)/(76-82) 146/76 (06/04 0355) SpO2:  [95 %-100 %] 95 % (06/04 0355) Last BM Date: 11/28/18  Intake/Output from previous day: 06/03 0701 - 06/04 0700 In: -  Out: 181 [Urine:1; Drains:180] Intake/Output this shift: No intake/output data recorded.  PE: Gen: Awake and alert, NAD Heart: RRR Lungs: CTA b/l, normal effort  Abd: Soft, ND, tenderness of the RUQ and around right drain site. No r/r/g. Drain in place with minimal bilious drainage in JP drain. 180cc overnight. Was just emptied this morning.  Msk: No edema. SCD's in place.   Lab Results:  Recent Labs    11/29/18 0437 11/30/18 0604  WBC 7.6 11.2*  HGB 8.7* 9.6*  HCT 26.3* 29.7*  PLT 249 304   BMET Recent Labs    11/29/18 0437 11/30/18 0604  NA 142 141  K 3.1* 3.4*  CL 103 102  CO2 27 25  GLUCOSE 112* 133*  BUN 7* 7*  CREATININE 0.84 0.92  CALCIUM 8.4* 8.9   PT/INR No results for input(s): LABPROT, INR in the last 72 hours. CMP     Component Value Date/Time   NA 141 11/30/2018 0604   K 3.4 (L) 11/30/2018 0604   CL 102 11/30/2018 0604   CO2 25 11/30/2018 0604   GLUCOSE 133 (H) 11/30/2018 0604   BUN 7 (L) 11/30/2018 0604   CREATININE 0.92 11/30/2018 0604   CREATININE 0.86 03/24/2016 1635   CALCIUM 8.9 11/30/2018 0604   PROT 6.5 11/30/2018 0604   ALBUMIN 2.7 (L) 11/30/2018 0604   AST 16 11/30/2018 0604   ALT 14 11/30/2018 0604   ALKPHOS 98 11/30/2018 0604   BILITOT 0.7 11/30/2018 0604   GFRNONAA >60 11/30/2018 0604   GFRAA >60  11/30/2018 0604   Lipase     Component Value Date/Time   LIPASE 21 11/25/2018 1530       Studies/Results: No results found.  Anti-infectives: Anti-infectives (From admission, onward)   Start     Dose/Rate Route Frequency Ordered Stop   11/27/18 1400  cefTRIAXone (ROCEPHIN) 2 g in sodium chloride 0.9 % 100 mL IVPB     2 g 200 mL/hr over 30 Minutes Intravenous Every 24 hours 11/27/18 1023     11/26/18 1615  piperacillin-tazobactam (ZOSYN) IVPB 3.375 g     3.375 g 100 mL/hr over 30 Minutes Intravenous  Once 11/26/18 1608 11/26/18 1653   11/25/18 2200  piperacillin-tazobactam (ZOSYN) IVPB 3.375 g  Status:  Discontinued     3.375 g 12.5 mL/hr over 240 Minutes Intravenous Every 8 hours 11/25/18 2044 11/27/18 1023   11/25/18 1545  piperacillin-tazobactam (ZOSYN) IVPB 3.375 g     3.375 g 100 mL/hr over 30 Minutes Intravenous  Once 11/25/18 1534 11/25/18 1635       Assessment/Plan HTN HLD  E. Coli Bacteremia Acute Calculous Cholecystitis  S/p ERCP/spincterotomy for choledocholithiasis - 5/22, Dr. Henrene Pastor S/pLaparoscopic FenestratedSubtotalCholecystectomy, Dr. Donne Hazel, 11/26/2018 POD #4 -GB was noted to have purulence in surgery and was necrotic -T bili & LFTS normalized - Surgical  path - chronic cholecystitis  - Bile leak based on JP drainage over last two days. Supposed to undergo ERCP with GI today  - Mobilize and IS  FEN -NPO for ERCP VTE -SCDs. Chemical prophylaxis on hold for ERCP. Hgb 9.9>9.0>8.5>8.7>9.6. VSS ID -Zosyn 5/30-6/1. Rocephin 6/1>> (day 4).WBC 12.3>11.1>7.6>11.2 Foley - None Follow up - Dr. Donne Hazel    LOS: 5 days    Jillyn Ledger , [facility] 11/30/2018, 10:08 AM Pager: [phone]

## 2018-11-30 NOTE — Op Note (Addendum)
[facility] Patient Name: Janice Rodriguez Procedure Date : 11/30/2018 MRN: [id number] Attending MD: Ladene Artist , MD Date of Birth: March 04, 1956 CSN: [id number] Age: 63 Admit Type: Inpatient Procedure:                ERCP Indications:              Bile leak, S/P subtotal fenestrating lap                            cholecystectomy Providers:                Pricilla Riffle. Fuller Plan, MD, Carlyn Reichert, RN, Ladona Ridgel, Technician Referring MD:             Mila Homer. Donne Hazel, MD Medicines:                General Anesthesia Complications:            No immediate complications. Estimated Blood Loss:     Estimated blood loss was minimal. Procedure:                Pre-Anesthesia Assessment:                           - Prior to the procedure, a History and Physical                            was performed, and patient medications and                            allergies were reviewed. The patient's tolerance of                            previous anesthesia was also reviewed. The risks                            and benefits of the procedure and the sedation                            options and risks were discussed with the patient.                            All questions were answered, and informed consent                            was obtained. Prior Anticoagulants: The patient has                            taken no previous anticoagulant or antiplatelet                            agents. ASA Grade Assessment: II - A patient with  mild systemic disease. After reviewing the risks                            and benefits, the patient was deemed in                            satisfactory condition to undergo the procedure.                           After obtaining informed consent, the scope was                            passed under direct vision. Throughout the                            procedure, the patient's blood  pressure, pulse, and                            oxygen saturations were monitored continuously. The                            [id number]-Q180V ([id number]) Olympus duodenoscope was                            introduced through the mouth, and used to inject                            contrast into and used to inject contrast into the                            bile duct. The ERCP was accomplished without                            difficulty. The patient tolerated the procedure                            well. Scope In: Scope Out: Findings:      A scout film of the abdomen was obtained. A drain and surgical clips,       consistent with a previous cholecystectomy, were seen in the area of the       right upper quadrant of the abdomen. The esophagus was successfully       intubated under direct vision. The scope was advanced to a major papilla       with a prior sphincterotomy noted in the descending duodenum without       detailed examination of the pharynx, larynx and associated structures,       and upper GI tract. The upper GI tract was grossly normal. A straight       Roadrunner wire was passed into the biliary tree. The short-nosed       traction sphincterotome was passed over the guidewire and the bile duct       was then deeply cannulated. Contrast was injected. I personally       interpreted the bile duct images. There was appropriate flow of contrast  through the ducts. The distal common bile duct contained one stone,       which was 10 mm in diameter. The common bile duct was diffusely dilated,       with a stone causing an obstruction. The largest diameter was 11 mm. The       intrahepatic ducts were mildly dilated. A cholecystectomy had been       performed. Extravasation of contrast originating from the cystic duct       was observed. The biliary tree was swept with a 12 mm balloon starting       at the bifurcation. Sludge was swept from the duct. One stone was       removed. No  stones remained. Very good biliary drainage was then noted.       One 10 mm by 4 cm covered metal stent with no external flaps and no       internal flaps was placed 3 cm into the common bile duct. Bile and small       amounts of sludge flowed through the stent. The stent was in good       position. Very good biliary drainage continued. The PD was not       cannulated or injected by intention. Impression:               - The common bile duct was dilated, with a stone                            causing an obstruction.                           - A bile leak was found.                           - Prior cholecystectomy.                           - Prior sphincterotomy.                           - Choledocholithiasis was found and complete                            removal was accomplished by balloon extraction.                           - One covered metal stent was placed into the                            common bile duct. Recommendation:           - Return patient to hospital ward for ongoing care.                           - Observe patient's clinical course following                            today's ERCP with therapeutic intervention.                           -  Return to GI office in 6 weeks with Dr. [name].                           - ERCP with stent removal in about 2-3 months per                            Dr. [name]. Procedure Code(s):        --- Professional ---                           [phone], Endoscopic retrograde                            cholangiopancreatography (ERCP); with placement of                            endoscopic stent into biliary or pancreatic duct,                            including pre- and post-dilation and guide wire                            passage, when performed, including sphincterotomy,                            when performed, each stent                           [id number], Endoscopic retrograde                             cholangiopancreatography (ERCP); with removal of                            calculi/debris from biliary/pancreatic duct(s) Diagnosis Code(s):        --- Professional ---                           K80.51, Calculus of bile duct without cholangitis                            or cholecystitis with obstruction                           K83.9, Disease of biliary tract, unspecified                           Z90.49, Acquired absence of other specified parts                            of digestive tract                           K83.8, Other  specified diseases of biliary tract CPT copyright 2019 American Medical Association. All rights reserved. The codes documented in this report are preliminary and upon coder review may  be revised to meet current compliance requirements. Ladene Artist, MD 11/30/2018 1:56:14 PM This report has been signed electronically. Number of Addenda: 0

## 2018-11-30 NOTE — Interval H&P Note (Signed)
History and Physical Interval Note:  11/30/2018 12:47 PM  Janice Rodriguez  has presented today for surgery, with the diagnosis of Suspected postoperative bile leak after partial cholecystectomy.  The various methods of treatment have been discussed with the patient and family. After consideration of risks, benefits and other options for treatment, the patient has consented to  Procedure(s): ENDOSCOPIC RETROGRADE CHOLANGIOPANCREATOGRAPHY (ERCP) WITH PROPOFOL (N/A) as a surgical intervention.  The patient's history has been reviewed, patient examined, no change in status, stable for surgery.  I have reviewed the patient's chart and labs.  Questions were answered to the patient's satisfaction.     Pricilla Riffle. Fuller Plan

## 2018-11-30 NOTE — Transfer of Care (Signed)
Immediate Anesthesia Transfer of Care Note  Patient: Janice Rodriguez  Procedure(s) Performed: ENDOSCOPIC RETROGRADE CHOLANGIOPANCREATOGRAPHY (ERCP) WITH PROPOFOL (N/A )  Patient Location: Endoscopy Unit  Anesthesia Type:General  Level of Consciousness: awake, alert  and oriented  Airway & Oxygen Therapy: Patient Spontanous Breathing and Patient connected to face mask oxygen  Post-op Assessment: Report given to RN, Post -op Vital signs reviewed and stable and Patient moving all extremities X 4  Post vital signs: Reviewed and stable  Last Vitals:  Vitals Value Taken Time  BP    Temp    Pulse    Resp    SpO2      Last Pain:  Vitals:   11/30/18 1156  TempSrc: Temporal  PainSc: 0-No pain      Patients Stated Pain Goal: 3 (53/29/92 4268)  Complications: No apparent anesthesia complications

## 2018-11-30 NOTE — Progress Notes (Signed)
PROGRESS NOTE    Janice Rodriguez  DVV:[id number] DOB: 1955-11-15 DOA: 11/25/2018 PCP: [name], DO   Brief Narrative:    Janice Rodriguez a 63 y.o.femalewith medical history significant forhypertensionwho has been having intermittent right upper quadrant abdominal pain which radiates to her back for 3 to 4 weeks. She has been evaluated by her GI physician, Dr. Henrene Pastor. She had abdominal ultrasound on 11/13/2018 which showed a dilated common bile duct. She was started on oral ciprofloxacin. She underwent ERCP on 11/17/2018 was found to have choledocholithiasis status post ERCP with sphincterotomy and successful bile duct stone extraction.Since her ERCP, she has had continued intermittent mild right upper quadrant pain which has been 6-7 out of 10 at its most intense with continued radiation to her back. Pain has been well controlled with as needed Tylenol. This morning she developed shaking chills and fever at home. She had nausea with one episode of bilious emesis. Due to worsening symptoms she presented to the ED for further evaluation.   Assessment & Plan:   Principal Problem:   Cholangitis Active Problems:   Essential hypertension   AKI (acute kidney injury) ([place])   Bile leak   Acute calculus cholecystitis/cholangitis s/p lap cholecystectomy with intraoperative cholangiogram and drain placement.  Further recommendations as per general surgery. As per the patient the color and the consistency of the drain has changed concerning for a bile leak. Plan for ERCP today to see if she needs stent placement.     E. coli bacteremia likely from GI source Blood cultures show growing E. coli from 11/25/2018 sample.  Patient currently on Rocephin and a repeat blood cultures are negative so far.  Plan to complete 2 weeks of antibiotics.   Leukocytosis improving  patient remains afebrile.   Hypokalemia Replaced.   AKI probably from prerenal causes Improved with  hydration.  Continue monitor the creatinine.    Chronic anemia of blood loss status post surgery/anemia of chronic disease. Transfuse to keep hemoglobin greater than 7.   Essential hypertension Well-controlled Resume home medications.Marland Kitchen    GERD continue with PPI     DVT prophylaxis: SCDs Code Status: Full code Family Communication: None at bedside, discussed the plan with the patient Disposition Plan: Pending further management and clinical improvement.   Consultants:   General surgery  Gastroenterology  Procedures:  Lap cholecystectomy, intraoperative cholangiogram.  ERCP scheduled for tomorrow  Antimicrobials: rocephin since admission.   Subjective:   Objective: Vitals:   11/29/18 1309 11/29/18 2000 11/30/18 0355 11/30/18 1156  BP: (!) 146/81 139/82 (!) 146/76 (!) 167/82  Pulse: 77 75 83 75  Resp: 18 14 14 20   Temp: 98.2 F (36.8 C) 98.1 F (36.7 C) 99.3 F (37.4 C) 99.4 F (37.4 C)  TempSrc: Oral Oral Oral Temporal  SpO2: 100% 99% 95% 98%  Weight:      Height:        Intake/Output Summary (Last 24 hours) at 11/30/2018 1255 Last data filed at 11/30/2018 0600 Gross per 24 hour  Intake -  Output 61 ml  Net -61 ml   Filed Weights   11/25/18 2210 11/26/18 0945  Weight: 87.5 kg 87.5 kg    Examination:  General exam: Appears calm and comfortable  Respiratory system: Clear to auscultation. Respiratory effort normal. Cardiovascular system: S1 & S2 heard, RRR Gastrointestinal system: Abdomen is soft , non distended.  tender at the site of the drain. JP drain in place with dark liquid. Central nervous system: Alert and oriented. No focal neurological  deficits. Extremities: Symmetric 5 x 5 power. Skin: No rashes, lesions or ulcers Psychiatry:  Mood & affect appropriate.     Data Reviewed: I have personally reviewed following labs and imaging studies  CBC: Recent Labs  Lab 11/25/18 1530 11/26/18 0321 11/27/18 0440 11/28/18 0520 11/29/18 0437  11/30/18 0604  WBC 14.5* 10.0 12.3* 11.1* 7.6 11.2*  NEUTROABS 12.4*  --  10.5* 7.6  --   --   HGB 11.5* 9.9* 9.0* 8.5* 8.7* 9.6*  HCT 35.4* 29.9* 27.7* 26.0* 26.3* 29.7*  MCV 81.2 80.2 81.0 80.2 81.4 81.6  PLT 264 220 208 225 249 885   Basic Metabolic Panel: Recent Labs  Lab 11/25/18 1530 11/26/18 0321 11/27/18 0440 11/29/18 0437 11/30/18 0604  NA 138 140 137 142 141  K 3.5 3.0* 3.6 3.1* 3.4*  CL 100 104 101 103 102  CO2 25 25 24 27 25   GLUCOSE 131* 105* 175* 112* 133*  BUN 9 5* 5* 7* 7*  CREATININE 1.44* 0.94 0.88 0.84 0.92  CALCIUM 9.0 8.3* 8.4* 8.4* 8.9   GFR: Estimated Creatinine Clearance: 67.9 mL/min (by C-G formula based on SCr of 0.92 mg/dL). Liver Function Tests: Recent Labs  Lab 11/25/18 1530 11/26/18 0321 11/27/18 0440 11/29/18 0437 11/30/18 0604  AST 19 16 44* 17 16  ALT 16 13 28 17 14   ALKPHOS 160* 132* 113 91 98  BILITOT 1.7* 1.4* 0.8 0.9 0.7  PROT 7.5 6.1* 6.1* 5.8* 6.5  ALBUMIN 3.1* 2.6* 2.4* 2.3* 2.7*   Recent Labs  Lab 11/25/18 1530  LIPASE 21   No results for input(s): AMMONIA in the last 168 hours. Coagulation Profile: No results for input(s): INR, PROTIME in the last 168 hours. Cardiac Enzymes: No results for input(s): CKTOTAL, CKMB, CKMBINDEX, TROPONINI in the last 168 hours. BNP (last 3 results) No results for input(s): PROBNP in the last 8760 hours. HbA1C: No results for input(s): HGBA1C in the last 72 hours. CBG: No results for input(s): GLUCAP in the last 168 hours. Lipid Profile: No results for input(s): CHOL, HDL, LDLCALC, TRIG, CHOLHDL, LDLDIRECT in the last 72 hours. Thyroid Function Tests: No results for input(s): TSH, T4TOTAL, FREET4, T3FREE, THYROIDAB in the last 72 hours. Anemia Panel: No results for input(s): VITAMINB12, FOLATE, FERRITIN, TIBC, IRON, RETICCTPCT in the last 72 hours. Sepsis Labs: Recent Labs  Lab 11/25/18 1530  LATICACIDVEN 1.2    Recent Results (from the past 240 hour(s))  Culture, blood  (routine x 2)     Status: Abnormal   Collection Time: 11/25/18  3:30 PM  Result Value Ref Range Status   Specimen Description BLOOD RIGHT ARM  Final   Special Requests   Final    BOTTLES DRAWN AEROBIC AND ANAEROBIC Blood Culture results may not be optimal due to an inadequate volume of blood received in culture bottles   Culture  Setup Time   Final    GRAM NEGATIVE RODS IN BOTH AEROBIC AND ANAEROBIC BOTTLES CRITICAL VALUE NOTED.  VALUE IS CONSISTENT WITH PREVIOUSLY REPORTED AND CALLED VALUE.    Culture (A)  Final    ESCHERICHIA COLI SUSCEPTIBILITIES PERFORMED ON PREVIOUS CULTURE WITHIN THE LAST 5 DAYS. Performed at [facility], [place] [address], [place], [place] [address]    Report Status 11/28/2018 FINAL  Final  Culture, blood (routine x 2)     Status: Abnormal   Collection Time: 11/25/18  3:30 PM  Result Value Ref Range Status   Specimen Description BLOOD LEFT ARM  Final  Special Requests   Final    BOTTLES DRAWN AEROBIC AND ANAEROBIC Blood Culture results may not be optimal due to an inadequate volume of blood received in culture bottles   Culture  Setup Time   Final    IN BOTH AEROBIC AND ANAEROBIC BOTTLES GRAM NEGATIVE RODS CRITICAL RESULT CALLED TO, READ BACK BY AND VERIFIED WITH: [facility] [id number] FCP Performed at [facility], [place] [address] [place], [place] [address]    Culture ESCHERICHIA COLI (A)  Final   Report Status 11/28/2018 FINAL  Final   Organism ID, Bacteria ESCHERICHIA COLI  Final      Susceptibility   Escherichia coli - MIC*    AMPICILLIN >=32 RESISTANT Resistant     CEFAZOLIN 32 INTERMEDIATE Intermediate     CEFEPIME <=1 SENSITIVE Sensitive     CEFTAZIDIME <=1 SENSITIVE Sensitive     CEFTRIAXONE <=1 SENSITIVE Sensitive     CIPROFLOXACIN >=4 RESISTANT Resistant     GENTAMICIN <=1 SENSITIVE Sensitive     IMIPENEM <=0.25 SENSITIVE Sensitive     TRIMETH/SULFA <=20 SENSITIVE Sensitive     AMPICILLIN/SULBACTAM >=32 RESISTANT Resistant      Extended ESBL NEGATIVE Sensitive     * ESCHERICHIA COLI  Blood Culture ID Panel (Reflexed)     Status: Abnormal   Collection Time: 11/25/18  3:30 PM  Result Value Ref Range Status   Enterococcus species NOT DETECTED NOT DETECTED Final   Listeria monocytogenes NOT DETECTED NOT DETECTED Final   Staphylococcus species NOT DETECTED NOT DETECTED Final   Staphylococcus aureus (BCID) NOT DETECTED NOT DETECTED Final   Streptococcus species NOT DETECTED NOT DETECTED Final   Streptococcus agalactiae NOT DETECTED NOT DETECTED Final   Streptococcus pneumoniae NOT DETECTED NOT DETECTED Final   Streptococcus pyogenes NOT DETECTED NOT DETECTED Final   Acinetobacter baumannii NOT DETECTED NOT DETECTED Final   Enterobacteriaceae species DETECTED (A) NOT DETECTED Final    Comment: Enterobacteriaceae represent a large family of gram-negative bacteria, not a single organism. CRITICAL RESULT CALLED TO, READ BACK BY AND VERIFIED WITH: PHARMD [facility] [id number] FCP    Enterobacter cloacae complex NOT DETECTED NOT DETECTED Final   Escherichia coli DETECTED (A) NOT DETECTED Final    Comment: CRITICAL RESULT CALLED TO, READ BACK BY AND VERIFIED WITH: PHARMD MACCIA [id number] FCP    Klebsiella oxytoca NOT DETECTED NOT DETECTED Final   Klebsiella pneumoniae NOT DETECTED NOT DETECTED Final   Proteus species NOT DETECTED NOT DETECTED Final   Serratia marcescens NOT DETECTED NOT DETECTED Final   Carbapenem resistance NOT DETECTED NOT DETECTED Final   Haemophilus influenzae NOT DETECTED NOT DETECTED Final   Neisseria meningitidis NOT DETECTED NOT DETECTED Final   Pseudomonas aeruginosa NOT DETECTED NOT DETECTED Final   Candida albicans NOT DETECTED NOT DETECTED Final   Candida glabrata NOT DETECTED NOT DETECTED Final   Candida krusei NOT DETECTED NOT DETECTED Final   Candida parapsilosis NOT DETECTED NOT DETECTED Final   Candida tropicalis NOT DETECTED NOT DETECTED Final    Comment: Performed at [facility], [place] [address], [place], [place] [address]  SARS Coronavirus 2 (CEPHEID - Performed in [place] hospital lab), Hosp Order     Status: None   Collection Time: 11/25/18  4:50 PM  Result Value Ref Range Status   SARS Coronavirus 2 NEGATIVE NEGATIVE Final    Comment: (NOTE) If result is NEGATIVE SARS-CoV-2 target nucleic acids are NOT DETECTED. The SARS-CoV-2 RNA is generally detectable  in upper and lower  respiratory specimens during the acute phase of infection. The lowest  concentration of SARS-CoV-2 viral copies this assay can detect is 250  copies / mL. A negative result does not preclude SARS-CoV-2 infection  and should not be used as the sole basis for treatment or other  patient management decisions.  A negative result may occur with  improper specimen collection / handling, submission of specimen other  than nasopharyngeal swab, presence of viral mutation(s) within the  areas targeted by this assay, and inadequate number of viral copies  (<250 copies / mL). A negative result must be combined with clinical  observations, patient history, and epidemiological information. If result is POSITIVE SARS-CoV-2 target nucleic acids are DETECTED. The SARS-CoV-2 RNA is generally detectable in upper and lower  respiratory specimens dur ing the acute phase of infection.  Positive  results are indicative of active infection with SARS-CoV-2.  Clinical  correlation with patient history and other diagnostic information is  necessary to determine patient infection status.  Positive results do  not rule out bacterial infection or co-infection with other viruses. If result is PRESUMPTIVE POSTIVE SARS-CoV-2 nucleic acids MAY BE PRESENT.   A presumptive positive result was obtained on the submitted specimen  and confirmed on repeat testing.  While 2019 novel coronavirus  (SARS-CoV-2) nucleic acids may be present in the submitted sample  additional confirmatory testing may be necessary for  epidemiological  and / or clinical management purposes  to differentiate between  SARS-CoV-2 and other Sarbecovirus currently known to infect humans.  If clinically indicated additional testing with an alternate test  methodology [phone]) is advised. The SARS-CoV-2 RNA is generally  detectable in upper and lower respiratory sp ecimens during the acute  phase of infection. The expected result is Negative. Fact Sheet for Patients:  StrictlyIdeas.no Fact Sheet for Healthcare Providers: BankingDealers.co.za This test is not yet approved or cleared by the [place] FDA and has been authorized for detection and/or diagnosis of SARS-CoV-2 by FDA under an Emergency Use Authorization (EUA).  This EUA will remain in effect (meaning this test can be used) for the duration of the COVID-19 declaration under Section 564(b)(1) of the Act, 21 U.S.C. section 360bbb-3(b)(1), unless the authorization is terminated or revoked sooner. Performed at [facility], [place] [address] [place], [place] [address]   Surgical pcr screen     Status: None   Collection Time: 11/26/18 12:26 AM  Result Value Ref Range Status   MRSA, PCR NEGATIVE NEGATIVE Final   Staphylococcus aureus NEGATIVE NEGATIVE Final    Comment: (NOTE) The Xpert SA Assay (FDA approved for NASAL specimens in patients 84 years of age and older), is one component of a comprehensive surveillance program. It is not intended to diagnose infection nor to guide or monitor treatment. Performed at [facility], [place] [address], [place], [place] [address]   Culture, blood (routine x 2)     Status: None (Preliminary result)   Collection Time: 11/27/18  4:35 AM  Result Value Ref Range Status   Specimen Description BLOOD LEFT ANTECUBITAL  Final   Special Requests   Final    BOTTLES DRAWN AEROBIC ONLY Blood Culture adequate volume   Culture   Final    NO GROWTH 3 DAYS Performed at [facility], [place] [address] [place], [place] [address]    Report Status PENDING  Incomplete  Culture, blood (routine x 2)     Status: None (Preliminary  result)   Collection Time: 11/27/18  4:40 AM  Result Value Ref Range Status   Specimen Description BLOOD LEFT HAND  Final   Special Requests   Final    BOTTLES DRAWN AEROBIC ONLY Blood Culture adequate volume   Culture   Final    NO GROWTH 3 DAYS Performed at [facility], [address] [place], [place] [address]    Report Status PENDING  Incomplete         Radiology Studies: No results found.      Scheduled Meds: . [MAR Hold] acidophilus  1 capsule Oral Once per day on Mon Wed Fri  . [MAR Hold] cholecalciferol  1,000 Units Oral Daily  . [MAR Hold] hydrochlorothiazide  12.5 mg Oral q morning - 10a  . [MAR Hold] irbesartan  150 mg Oral Daily  . [MAR Hold] metoprolol succinate  75 mg Oral Daily  . [MAR Hold] pantoprazole  40 mg Oral Daily  . [MAR Hold] senna-docusate  1 tablet Oral QHS  . [MAR Hold] vitamin C  500 mg Oral Daily   Continuous Infusions: . [MAR Hold] cefTRIAXone (ROCEPHIN)  IV 2 g (11/29/18 1332)  . lactated ringers 20 mL/hr at 11/30/18 1200     LOS: 5 days    Time spent: 38 minutes.     Hosie Poisson, MD [facility] Pager [phone]  If 7PM-7AM, please contact night-coverage [email or web address] Password [email or web address] 11/30/2018, 12:55 PM

## 2018-11-30 NOTE — Anesthesia Preprocedure Evaluation (Addendum)
Anesthesia Evaluation  Patient identified by MRN, date of birth, ID band Patient awake    Reviewed: Allergy & Precautions, NPO status , Patient's Chart, lab work & pertinent test results  Airway Mallampati: I  TM Distance: >3 FB Neck ROM: Full    Dental  (+) Teeth Intact, Dental Advisory Given   Pulmonary neg pulmonary ROS,    breath sounds clear to auscultation       Cardiovascular hypertension, Pt. on medications and Pt. on home beta blockers  Rhythm:Regular Rate:Normal     Neuro/Psych negative neurological ROS     GI/Hepatic Neg liver ROS, GERD  Medicated,  Endo/Other  negative endocrine ROS  Renal/GU      Musculoskeletal negative musculoskeletal ROS (+)   Abdominal Normal abdominal exam  (+)   Peds  Hematology negative hematology ROS (+)   Anesthesia Other Findings   Reproductive/Obstetrics                            Lab Results  Component Value Date   WBC 11.2 (H) 11/30/2018   HGB 9.6 (L) 11/30/2018   HCT 29.7 (L) 11/30/2018   MCV 81.6 11/30/2018   PLT 304 11/30/2018   Lab Results  Component Value Date   CREATININE 0.92 11/30/2018   BUN 7 (L) 11/30/2018   NA 141 11/30/2018   K 3.4 (L) 11/30/2018   CL 102 11/30/2018   CO2 25 11/30/2018   No results found for: INR, PROTIME  Echo:  Anesthesia Physical Anesthesia Plan  ASA: II  Anesthesia Plan: General   Post-op Pain Management:    Induction: Intravenous  PONV Risk Score and Plan: 4 or greater and Ondansetron, Dexamethasone, Midazolam and Scopolamine patch - Pre-op  Airway Management Planned: Oral ETT  Additional Equipment: None  Intra-op Plan:   Post-operative Plan: Extubation in OR  Informed Consent: I have reviewed the patients History and Physical, chart, labs and discussed the procedure including the risks, benefits and alternatives for the proposed anesthesia with the patient or authorized  representative who has indicated his/her understanding and acceptance.     Dental advisory given  Plan Discussed with: CRNA  Anesthesia Plan Comments:        Anesthesia Quick Evaluation

## 2018-11-30 NOTE — Plan of Care (Signed)

## 2018-12-01 DIAGNOSIS — K9189 Other postprocedural complications and disorders of digestive system: Secondary | ICD-10-CM

## 2018-12-01 DIAGNOSIS — K838 Other specified diseases of biliary tract: Secondary | ICD-10-CM

## 2018-12-01 DIAGNOSIS — K839 Disease of biliary tract, unspecified: Secondary | ICD-10-CM

## 2018-12-01 LAB — COMPREHENSIVE METABOLIC PANEL
ALT: 16 U/L (ref 0–44)
AST: 17 U/L (ref 15–41)
Albumin: 2.6 g/dL — ABNORMAL LOW (ref 3.5–5.0)
Alkaline Phosphatase: 90 U/L (ref 38–126)
Anion gap: 12 (ref 5–15)
BUN: 6 mg/dL — ABNORMAL LOW (ref 8–23)
CO2: 27 mmol/L (ref 22–32)
Calcium: 8.9 mg/dL (ref 8.9–10.3)
Chloride: 101 mmol/L (ref 98–111)
Creatinine, Ser: 0.84 mg/dL (ref 0.44–1.00)
GFR calc Af Amer: >60 mL/min (ref >60)
GFR calc non Af Amer: >60 mL/min (ref >60)
Glucose, Bld: 196 mg/dL — ABNORMAL HIGH (ref 70–99)
Potassium: 3.2 mmol/L — ABNORMAL LOW (ref 3.5–5.1)
Sodium: 140 mmol/L (ref 135–145)
Total Bilirubin: 0.5 mg/dL (ref 0.3–1.2)
Total Protein: 6.6 g/dL (ref 6.5–8.1)

## 2018-12-01 LAB — CBC
HCT: 29.1 % — ABNORMAL LOW (ref 36.0–46.0)
Hemoglobin: 9.4 g/dL — ABNORMAL LOW (ref 12.0–15.0)
MCH: 26.3 pg (ref 26.0–34.0)
MCHC: 32.3 g/dL (ref 30.0–36.0)
MCV: 81.5 fL (ref 80.0–100.0)
Platelets: 331 10*3/uL (ref 150–400)
RBC: 3.57 MIL/uL — ABNORMAL LOW (ref 3.87–5.11)
RDW: 16.6 % — ABNORMAL HIGH (ref 11.5–15.5)
WBC: 12 10*3/uL — ABNORMAL HIGH (ref 4.0–10.5)
nRBC: 0 % (ref 0.0–0.2)

## 2018-12-01 LAB — TOTAL BILIRUBIN, BODY FLUID: Total bilirubin, fluid: 20.5 mg/dL

## 2018-12-01 MED ORDER — POTASSIUM CHLORIDE CRYS ER 20 MEQ PO TBCR
40.0000 meq | EXTENDED_RELEASE_TABLET | Freq: Two times a day (BID) | ORAL | Status: AC
  Administered 2018-12-01: 40 meq via ORAL
  Filled 2018-12-01 (×2): qty 2

## 2018-12-01 NOTE — Progress Notes (Signed)
PROGRESS NOTE    Janice Rodriguez  EXB:[id number] DOB: 1955/11/06 DOA: 11/25/2018 PCP: [name], DO   Brief Narrative:    Blanka Rockholt a 63 y.o.femalewith medical history significant forhypertensionwho has been having intermittent right upper quadrant abdominal pain which radiates to her back for 3 to 4 weeks. She has been evaluated by her GI physician, Dr. Henrene Pastor. She had abdominal ultrasound on 11/13/2018 which showed a dilated common bile duct. She was started on oral ciprofloxacin. She underwent ERCP on 11/17/2018 was found to have choledocholithiasis status post ERCP with sphincterotomy and successful bile duct stone extraction.Since her ERCP, she has had continued intermittent mild right upper quadrant pain which has been 6-7 out of 10 at its most intense with continued radiation to her back. Pain has been well controlled with as needed Tylenol. This morning she developed shaking chills and fever at home. She had nausea with one episode of bilious emesis. Due to worsening symptoms she presented to the ED for further evaluation.   Assessment & Plan:   Principal Problem:   Cholangitis Active Problems:   Essential hypertension   AKI (acute kidney injury) (HCC)   Bile leak, postoperative   Acute calculus cholecystitis/cholangitis s/p lap cholecystectomy with intraoperative cholangiogram and drain placement. As per the patient the color and the consistency of the drain has changed concerning for a bile leak. She underwent ERCP yesterday, underwent stone removal and a metal stent placement.  She is cleared from surgical stand point.     E. coli bacteremia likely from GI source Blood cultures show growing E. coli from 11/25/2018 sample.  Patient currently on Rocephin and a repeat blood cultures are negative so far. Would complete another dose of rocephin tomorrow and discharge patient on oral bactrim for  3 more days to complete the course.   Leukocytosis  improving  patient remains afebrile.   Hypokalemia Replaced. Repeat K in am,  Check magnesium.    AKI probably from prerenal causes Improved with hydration.  Continue monitor the creatinine.    Chronic anemia of blood loss status post surgery/anemia of chronic disease. Transfuse to keep hemoglobin greater than 7.   Essential hypertension Well-controlled Resume home medications.Marland Kitchen    GERD continue with PPI     DVT prophylaxis: SCDs Code Status: Full code Family Communication: None at bedside, discussed the plan with the patient Disposition Plan: possible d/c tomorrow.    Consultants:   General surgery  Gastroenterology  Procedures:  Lap cholecystectomy, intraoperative cholangiogram.  ERCP scheduled for tomorrow  Antimicrobials: rocephin since admission.   Subjective:  Advanced diet  No nausea, vomiting or abdominal pain.   Objective: Vitals:   11/30/18 1418 11/30/18 1432 11/30/18 1938 12/01/18 0411  BP: (!) 144/65 139/75 128/73 138/81  Pulse: 74 71 74 70  Resp: (!) 21 18 18 18   Temp:  98.4 F (36.9 C) 98.3 F (36.8 C) 97.7 F (36.5 C)  TempSrc:  Oral Oral Oral  SpO2: 100% 97% 95% 96%  Weight:      Height:        Intake/Output Summary (Last 24 hours) at 12/01/2018 1252 Last data filed at 12/01/2018 0811 Gross per 24 hour  Intake 1040 ml  Output 10 ml  Net 1030 ml   Filed Weights   11/25/18 2210 11/26/18 0945  Weight: 87.5 kg 87.5 kg    Examination:  General exam: Appears calm and comfortable , not in distress Respiratory system: Clear to auscultation. Respiratory effort normal. No wheezing or rhonchi.  Cardiovascular  system: S1 & S2 heard, RRR Gastrointestinal system: Abdomen is soft , non distended.  tender at the site of the drain. JP drain in place with dark liquid.  Central nervous system: Alert and oriented. No focal neurological deficits. Extremities: Symmetric 5 x 5 power. Skin: No rashes, lesions or ulcers Psychiatry:  Mood &  affect appropriate.     Data Reviewed: I have personally reviewed following labs and imaging studies  CBC: Recent Labs  Lab 11/25/18 1530  11/27/18 0440 11/28/18 0520 11/29/18 0437 11/30/18 0604 12/01/18 0920  WBC 14.5*   < > 12.3* 11.1* 7.6 11.2* 12.0*  NEUTROABS 12.4*  --  10.5* 7.6  --   --   --   HGB 11.5*   < > 9.0* 8.5* 8.7* 9.6* 9.4*  HCT 35.4*   < > 27.7* 26.0* 26.3* 29.7* 29.1*  MCV 81.2   < > 81.0 80.2 81.4 81.6 81.5  PLT 264   < > 208 225 249 304 331   < > = values in this interval not displayed.   Basic Metabolic Panel: Recent Labs  Lab 11/26/18 0321 11/27/18 0440 11/29/18 0437 11/30/18 0604 12/01/18 0920  NA 140 137 142 141 140  K 3.0* 3.6 3.1* 3.4* 3.2*  CL 104 101 103 102 101  CO2 25 24 27 25 27   GLUCOSE 105* 175* 112* 133* 196*  BUN 5* 5* 7* 7* 6*  CREATININE 0.94 0.88 0.84 0.92 0.84  CALCIUM 8.3* 8.4* 8.4* 8.9 8.9   GFR: Estimated Creatinine Clearance: 74.4 mL/min (by C-G formula based on SCr of 0.84 mg/dL). Liver Function Tests: Recent Labs  Lab 11/26/18 0321 11/27/18 0440 11/29/18 0437 11/30/18 0604 12/01/18 0920  AST 16 44* 17 16 17   ALT 13 28 17 14 16   ALKPHOS 132* 113 91 98 90  BILITOT 1.4* 0.8 0.9 0.7 0.5  PROT 6.1* 6.1* 5.8* 6.5 6.6  ALBUMIN 2.6* 2.4* 2.3* 2.7* 2.6*   Recent Labs  Lab 11/25/18 1530  LIPASE 21   No results for input(s): AMMONIA in the last 168 hours. Coagulation Profile: No results for input(s): INR, PROTIME in the last 168 hours. Cardiac Enzymes: No results for input(s): CKTOTAL, CKMB, CKMBINDEX, TROPONINI in the last 168 hours. BNP (last 3 results) No results for input(s): PROBNP in the last 8760 hours. HbA1C: No results for input(s): HGBA1C in the last 72 hours. CBG: No results for input(s): GLUCAP in the last 168 hours. Lipid Profile: No results for input(s): CHOL, HDL, LDLCALC, TRIG, CHOLHDL, LDLDIRECT in the last 72 hours. Thyroid Function Tests: No results for input(s): TSH, T4TOTAL, FREET4,  T3FREE, THYROIDAB in the last 72 hours. Anemia Panel: No results for input(s): VITAMINB12, FOLATE, FERRITIN, TIBC, IRON, RETICCTPCT in the last 72 hours. Sepsis Labs: Recent Labs  Lab 11/25/18 1530  LATICACIDVEN 1.2    Recent Results (from the past 240 hour(s))  Culture, blood (routine x 2)     Status: Abnormal   Collection Time: 11/25/18  3:30 PM  Result Value Ref Range Status   Specimen Description BLOOD RIGHT ARM  Final   Special Requests   Final    BOTTLES DRAWN AEROBIC AND ANAEROBIC Blood Culture results may not be optimal due to an inadequate volume of blood received in culture bottles   Culture  Setup Time   Final    GRAM NEGATIVE RODS IN BOTH AEROBIC AND ANAEROBIC BOTTLES CRITICAL VALUE NOTED.  VALUE IS CONSISTENT WITH PREVIOUSLY REPORTED AND CALLED VALUE.    Culture (A)  Final    ESCHERICHIA COLI SUSCEPTIBILITIES PERFORMED ON PREVIOUS CULTURE WITHIN THE LAST 5 DAYS. Performed at [facility], [place] [address], [place], [place] [address]    Report Status 11/28/2018 FINAL  Final  Culture, blood (routine x 2)     Status: Abnormal   Collection Time: 11/25/18  3:30 PM  Result Value Ref Range Status   Specimen Description BLOOD LEFT ARM  Final   Special Requests   Final    BOTTLES DRAWN AEROBIC AND ANAEROBIC Blood Culture results may not be optimal due to an inadequate volume of blood received in culture bottles   Culture  Setup Time   Final    IN BOTH AEROBIC AND ANAEROBIC BOTTLES GRAM NEGATIVE RODS CRITICAL RESULT CALLED TO, READ BACK BY AND VERIFIED WITH: [facility] [id number] FCP Performed at [facility], [place] [address], [place], [place] [address]    Culture ESCHERICHIA COLI (A)  Final   Report Status 11/28/2018 FINAL  Final   Organism ID, Bacteria ESCHERICHIA COLI  Final      Susceptibility   Escherichia coli - MIC*    AMPICILLIN >=32 RESISTANT Resistant     CEFAZOLIN 32 INTERMEDIATE Intermediate     CEFEPIME <=1 SENSITIVE Sensitive      CEFTAZIDIME <=1 SENSITIVE Sensitive     CEFTRIAXONE <=1 SENSITIVE Sensitive     CIPROFLOXACIN >=4 RESISTANT Resistant     GENTAMICIN <=1 SENSITIVE Sensitive     IMIPENEM <=0.25 SENSITIVE Sensitive     TRIMETH/SULFA <=20 SENSITIVE Sensitive     AMPICILLIN/SULBACTAM >=32 RESISTANT Resistant     Extended ESBL NEGATIVE Sensitive     * ESCHERICHIA COLI  Blood Culture ID Panel (Reflexed)     Status: Abnormal   Collection Time: 11/25/18  3:30 PM  Result Value Ref Range Status   Enterococcus species NOT DETECTED NOT DETECTED Final   Listeria monocytogenes NOT DETECTED NOT DETECTED Final   Staphylococcus species NOT DETECTED NOT DETECTED Final   Staphylococcus aureus (BCID) NOT DETECTED NOT DETECTED Final   Streptococcus species NOT DETECTED NOT DETECTED Final   Streptococcus agalactiae NOT DETECTED NOT DETECTED Final   Streptococcus pneumoniae NOT DETECTED NOT DETECTED Final   Streptococcus pyogenes NOT DETECTED NOT DETECTED Final   Acinetobacter baumannii NOT DETECTED NOT DETECTED Final   Enterobacteriaceae species DETECTED (A) NOT DETECTED Final    Comment: Enterobacteriaceae represent a large family of gram-negative bacteria, not a single organism. CRITICAL RESULT CALLED TO, READ BACK BY AND VERIFIED WITH: PHARMD Weatherby [phone] FCP    Enterobacter cloacae complex NOT DETECTED NOT DETECTED Final   Escherichia coli DETECTED (A) NOT DETECTED Final    Comment: CRITICAL RESULT CALLED TO, READ BACK BY AND VERIFIED WITH: PHARMD MACCIA [phone] FCP    Klebsiella oxytoca NOT DETECTED NOT DETECTED Final   Klebsiella pneumoniae NOT DETECTED NOT DETECTED Final   Proteus species NOT DETECTED NOT DETECTED Final   Serratia marcescens NOT DETECTED NOT DETECTED Final   Carbapenem resistance NOT DETECTED NOT DETECTED Final   Haemophilus influenzae NOT DETECTED NOT DETECTED Final   Neisseria meningitidis NOT DETECTED NOT DETECTED Final   Pseudomonas aeruginosa NOT DETECTED NOT DETECTED Final    Candida albicans NOT DETECTED NOT DETECTED Final   Candida glabrata NOT DETECTED NOT DETECTED Final   Candida krusei NOT DETECTED NOT DETECTED Final   Candida parapsilosis NOT DETECTED NOT DETECTED Final   Candida tropicalis NOT DETECTED NOT DETECTED Final    Comment: Performed at  [facility], [place] [address] [place], [place] [address]  SARS Coronavirus 2 (CEPHEID - Performed in [place] hospital lab), Hosp Order     Status: None   Collection Time: 11/25/18  4:50 PM  Result Value Ref Range Status   SARS Coronavirus 2 NEGATIVE NEGATIVE Final    Comment: (NOTE) If result is NEGATIVE SARS-CoV-2 target nucleic acids are NOT DETECTED. The SARS-CoV-2 RNA is generally detectable in upper and lower  respiratory specimens during the acute phase of infection. The lowest  concentration of SARS-CoV-2 viral copies this assay can detect is 250  copies / mL. A negative result does not preclude SARS-CoV-2 infection  and should not be used as the sole basis for treatment or other  patient management decisions.  A negative result may occur with  improper specimen collection / handling, submission of specimen other  than nasopharyngeal swab, presence of viral mutation(s) within the  areas targeted by this assay, and inadequate number of viral copies  (<250 copies / mL). A negative result must be combined with clinical  observations, patient history, and epidemiological information. If result is POSITIVE SARS-CoV-2 target nucleic acids are DETECTED. The SARS-CoV-2 RNA is generally detectable in upper and lower  respiratory specimens dur ing the acute phase of infection.  Positive  results are indicative of active infection with SARS-CoV-2.  Clinical  correlation with patient history and other diagnostic information is  necessary to determine patient infection status.  Positive results do  not rule out bacterial infection or co-infection with other viruses. If result is PRESUMPTIVE  POSTIVE SARS-CoV-2 nucleic acids MAY BE PRESENT.   A presumptive positive result was obtained on the submitted specimen  and confirmed on repeat testing.  While 2019 novel coronavirus  (SARS-CoV-2) nucleic acids may be present in the submitted sample  additional confirmatory testing may be necessary for epidemiological  and / or clinical management purposes  to differentiate between  SARS-CoV-2 and other Sarbecovirus currently known to infect humans.  If clinically indicated additional testing with an alternate test  methodology [phone]) is advised. The SARS-CoV-2 RNA is generally  detectable in upper and lower respiratory sp ecimens during the acute  phase of infection. The expected result is Negative. Fact Sheet for Patients:  StrictlyIdeas.no Fact Sheet for Healthcare Providers: BankingDealers.co.za This test is not yet approved or cleared by the [place] FDA and has been authorized for detection and/or diagnosis of SARS-CoV-2 by FDA under an Emergency Use Authorization (EUA).  This EUA will remain in effect (meaning this test can be used) for the duration of the COVID-19 declaration under Section 564(b)(1) of the Act, 21 U.S.C. section 360bbb-3(b)(1), unless the authorization is terminated or revoked sooner. Performed at [facility], [place] [address], [place], [place] [address]   Surgical pcr screen     Status: None   Collection Time: 11/26/18 12:26 AM  Result Value Ref Range Status   MRSA, PCR NEGATIVE NEGATIVE Final   Staphylococcus aureus NEGATIVE NEGATIVE Final    Comment: (NOTE) The Xpert SA Assay (FDA approved for NASAL specimens in patients 58 years of age and older), is one component of a comprehensive surveillance program. It is not intended to diagnose infection nor to guide or monitor treatment. Performed at [facility], [place] [address] [place], [place] [address]   Culture, blood (routine x 2)      Status: None (Preliminary result)   Collection Time: 11/27/18  4:35 AM  Result Value Ref Range  Status   Specimen Description BLOOD LEFT ANTECUBITAL  Final   Special Requests   Final    BOTTLES DRAWN AEROBIC ONLY Blood Culture adequate volume   Culture   Final    NO GROWTH 4 DAYS Performed at [facility], [address] [place], [place] [address]    Report Status PENDING  Incomplete  Culture, blood (routine x 2)     Status: None (Preliminary result)   Collection Time: 11/27/18  4:40 AM  Result Value Ref Range Status   Specimen Description BLOOD LEFT HAND  Final   Special Requests   Final    BOTTLES DRAWN AEROBIC ONLY Blood Culture adequate volume   Culture   Final    NO GROWTH 4 DAYS Performed at [facility], [place] [address], [place], [place] [address]    Report Status PENDING  Incomplete         Radiology Studies: Dg Ercp Biliary & Pancreatic Ducts  Result Date: 11/30/2018 CLINICAL DATA:  Post cholecystectomy, now with concern for bile leak. EXAM: ERCP TECHNIQUE: Multiple spot images obtained with the fluoroscopic device and submitted for interpretation post-procedure. FLUOROSCOPY TIME:  3 minutes, 33 seconds COMPARISON:  CT abdomen and pelvis - 11/25/2018 FINDINGS: Twelve spot intraoperative fluoroscopic images of the right upper abdominal quadrant during ERCP are provided for review. Initial image demonstrates an ERCP probe overlying the right upper abdominal quadrant. Cholecystectomy clips overlie the gallbladder fossa. Surgical drainage catheter overlies the right upper abdominal quadrant. Subsequent images demonstrate selective cannulation and opacification of the common bile duct which appears moderately dilated. Subsequent images demonstrate insufflation of a balloon within mid/distal aspect of the CBD. There is minimal opacification of the residual cystic duct with apparent extravasation of contrast from the residual cystic duct remnant. Completion images  demonstrate placement of a internal metallic biliary stent within distal aspect of the CBD. IMPRESSION: ERCP with findings worrisome for bile leak from the cystic duct remnant with subsequent placement of an internal metallic biliary stent. These images were submitted for radiologic interpretation only. Please see the procedural report for the amount of contrast and the fluoroscopy time utilized. Electronically Signed   By: Sandi Mariscal M.D.   On: 11/30/2018 14:56        Scheduled Meds:  acidophilus  1 capsule Oral Once per day on Mon Wed Fri   cholecalciferol  1,000 Units Oral Daily   hydrochlorothiazide  12.5 mg Oral q morning - 10a   irbesartan  150 mg Oral Daily   metoprolol succinate  75 mg Oral Daily   pantoprazole  40 mg Oral Daily   potassium chloride  40 mEq Oral BID   senna-docusate  1 tablet Oral QHS   vitamin C  500 mg Oral Daily   Continuous Infusions:  cefTRIAXone (ROCEPHIN)  IV 2 g (11/29/18 1332)     LOS: 6 days    Time spent: 28 minutes.     Hosie Poisson, MD [facility] Pager [phone]  If 7PM-7AM, please contact night-coverage [email or web address] Password [email or web address] 12/01/2018, 12:52 PM

## 2018-12-01 NOTE — Discharge Instructions (Signed)
[facility] ° °Please arrive at least 30 min before your appointment to complete your check in paperwork.  If you are unable to arrive 30 min prior to your appointment time we may have to cancel or reschedule you. °LAPAROSCOPIC SURGERY: POST OP INSTRUCTIONS °Always review your discharge instruction sheet given to you by the facility where your surgery was performed. °IF YOU HAVE DISABILITY OR FAMILY LEAVE FORMS, YOU MUST BRING THEM TO THE OFFICE FOR PROCESSING.   °DO NOT GIVE THEM TO YOUR DOCTOR. ° °PAIN CONTROL ° °1. First take acetaminophen (Tylenol) AND/or ibuprofen (Advil) to control your pain after surgery.  Follow directions on package.  Taking acetaminophen (Tylenol) and/or ibuprofen (Advil) regularly after surgery will help to control your pain and lower the amount of prescription pain medication you may need.  You should not take more than 4,000 mg (4 grams) of acetaminophen (Tylenol) in 24 hours.  You should not take ibuprofen (Advil), aleve, motrin, naprosyn or other NSAIDS if you have a history of stomach ulcers or chronic kidney disease.  °2. A prescription for pain medication may be given to you upon discharge.  Take your pain medication as prescribed, if you still have uncontrolled pain after taking acetaminophen (Tylenol) or ibuprofen (Advil). °3. Use ice packs to help control pain. °4. If you need a refill on your pain medication, please contact your pharmacy.  They will contact our office to request authorization. Prescriptions will not be filled after 5pm or on week-ends. ° °HOME MEDICATIONS °5. Take your usually prescribed medications unless otherwise directed. ° °DIET °6. You should follow a light diet the first few days after arrival home.  Be sure to include lots of fluids daily. Avoid fatty, fried foods.  ° °CONSTIPATION °7. It is common to experience some constipation after surgery and if you are taking pain medication.  Increasing fluid intake and taking a stool  softener (such as Colace) will usually help or prevent this problem from occurring.  A mild laxative (Milk of Magnesia or Miralax) should be taken according to package instructions if there are no bowel movements after 48 hours. ° °WOUND/INCISION CARE °8. Most patients will experience some swelling and bruising in the area of the incisions.  Ice packs will help.  Swelling and bruising can take several days to resolve.  °9. Unless discharge instructions indicate otherwise, follow guidelines below  °a. STERI-STRIPS - you may remove your outer bandages 48 hours after surgery, and you may shower at that time.  You have steri-strips (small skin tapes) in place directly over the incision.  These strips should be left on the skin for 7-10 days.   °b. DERMABOND/SKIN GLUE - you may shower in 24 hours.  The glue will flake off over the next 2-3 weeks. °10. Any sutures or staples will be removed at the office during your follow-up visit. ° °ACTIVITIES °11. You may resume regular (light) daily activities beginning the next day--such as daily self-care, walking, climbing stairs--gradually increasing activities as tolerated.  You may have sexual intercourse when it is comfortable.  Refrain from any heavy lifting or straining until approved by your doctor. °a. You may drive when you are no longer taking prescription pain medication, you can comfortably wear a seatbelt, and you can safely maneuver your car and apply brakes. ° °FOLLOW-UP °12. You should see your doctor in the office for a follow-up appointment approximately 2-3 weeks after your surgery.  You should have been given your post-op/follow-up appointment when   your surgery was scheduled.  If you did not receive a post-op/follow-up appointment, make sure that you call for this appointment within a day or two after you arrive home to insure a convenient appointment time.   WHEN TO CALL YOUR DOCTOR: 1. Fever over 101.0 2. Inability to urinate 3. Continued bleeding from  incision. 4. Increased pain, redness, or drainage from the incision. 5. Increasing abdominal pain  The clinic staff is available to answer your questions during regular business hours.  Please dont hesitate to call and ask to speak to one of the nurses for clinical concerns.  If you have a medical emergency, go to the nearest emergency room or call 911.  A surgeon from [facility] is always on call at the hospital. [address] [place], [place], [place]  [address] ? [address], [place], [place]   [address] [phone] ? [phone] ? FAX [phone]    Managing Your Pain After Surgery Without Opioids    Thank you for participating in our program to help patients manage their pain after surgery without opioids. This is part of our effort to provide you with the best care possible, without exposing you or your family to the risk that opioids pose.  What pain can I expect after surgery? You can expect to have some pain after surgery. This is normal. The pain is typically worse the day after surgery, and quickly begins to get better. Many studies have found that many patients are able to manage their pain after surgery with Over-the-Counter (OTC) medications such as Tylenol and Motrin. If you have a condition that does not allow you to take Tylenol or Motrin, notify your surgical team.  How will I manage my pain? The best strategy for controlling your pain after surgery is around the clock pain control with Tylenol (acetaminophen) and Motrin (ibuprofen or Advil). Alternating these medications with each other allows you to maximize your pain control. In addition to Tylenol and Motrin, you can use heating pads or ice packs on your incisions to help reduce your pain.  How will I alternate your regular strength over-the-counter pain medication? You will take a dose of pain medication every three hours. ; Start by taking 650 mg of Tylenol (2 pills of 325  mg) ; 3 hours later take 600 mg of Motrin (3 pills of 200 mg) ; 3 hours after taking the Motrin take 650 mg of Tylenol ; 3 hours after that take 600 mg of Motrin.   - 1 -  See example - if your first dose of Tylenol is at 12:00 PM   12:00 PM Tylenol 650 mg (2 pills of 325 mg)  3:00 PM Motrin 600 mg (3 pills of 200 mg)  6:00 PM Tylenol 650 mg (2 pills of 325 mg)  9:00 PM Motrin 600 mg (3 pills of 200 mg)  Continue alternating every 3 hours   We recommend that you follow this schedule around-the-clock for at least 3 days after surgery, or until you feel that it is no longer needed. Use the table on the last page of this handout to keep track of the medications you are taking. Important: Do not take more than 3000mg  of Tylenol or 2300mg  of Motrin in a 24-hour period. Do not take ibuprofen/Motrin if you have a history of bleeding stomach ulcers, severe kidney disease, &/or actively taking a blood thinner  What if I still have pain? If you have pain that is not controlled  with the over-the-counter pain medications (Tylenol and Motrin or Advil) you might have what we call breakthrough pain. You will receive a prescription for a small amount of an opioid pain medication such as Oxycodone, Tramadol, or Tylenol with Codeine. Use these opioid pills in the first 24 hours after surgery if you have breakthrough pain. Do not take more than 1 pill every 4-6 hours.  If you still have uncontrolled pain after using all opioid pills, don't hesitate to call our staff using the number provided. We will help make sure you are managing your pain in the best way possible, and if necessary, we can provide a prescription for additional pain medication.   Day 1    Time  Name of Medication Number of pills taken  Amount of Acetaminophen  Pain Level   Comments  AM PM       AM PM       AM PM       AM PM       AM PM       AM PM       AM PM       AM PM       Total Daily amount of Acetaminophen Do not  take more than  3,000 mg per day      Day 2    Time  Name of Medication Number of pills taken  Amount of Acetaminophen  Pain Level   Comments  AM PM       AM PM       AM PM       AM PM       AM PM       AM PM       AM PM       AM PM       Total Daily amount of Acetaminophen Do not take more than  3,000 mg per day      Day 3    Time  Name of Medication Number of pills taken  Amount of Acetaminophen  Pain Level   Comments  AM PM       AM PM       AM PM       AM PM          AM PM       AM PM       AM PM       AM PM       Total Daily amount of Acetaminophen Do not take more than  3,000 mg per day      Day 4    Time  Name of Medication Number of pills taken  Amount of Acetaminophen  Pain Level   Comments  AM PM       AM PM       AM PM       AM PM       AM PM       AM PM       AM PM       AM PM       Total Daily amount of Acetaminophen Do not take more than  3,000 mg per day      Day 5    Time  Name of Medication Number of pills taken  Amount of Acetaminophen  Pain Level   Comments  AM PM       AM PM       AM PM  AM PM       AM PM       AM PM       AM PM       AM PM       Total Daily amount of Acetaminophen Do not take more than  3,000 mg per day       Day 6    Time  Name of Medication Number of pills taken  Amount of Acetaminophen  Pain Level  Comments  AM PM       AM PM       AM PM       AM PM       AM PM       AM PM       AM PM       AM PM       Total Daily amount of Acetaminophen Do not take more than  3,000 mg per day      Day 7    Time  Name of Medication Number of pills taken  Amount of Acetaminophen  Pain Level   Comments  AM PM       AM PM       AM PM       AM PM       AM PM       AM PM       AM PM       AM PM       Total Daily amount of Acetaminophen Do not take more than  3,000 mg per day        For additional information about how and where to safely dispose of unused  opioid medications - [employer]  Disclaimer: This document contains information and/or instructional materials adapted from [place] for the typical patient with your condition. It does not replace medical advice from your health care provider because your experience may differ from that of the typical patient. Talk to your health care provider if you have any questions about this document, your condition or your treatment plan. Adapted from [place] Surgical drains are used to remove extra fluid that normally builds up in a surgical wound after surgery. A surgical drain helps to heal a surgical wound. Different kinds of surgical drains include:  Active drains. These drains use suction to pull drainage away from the surgical wound. Drainage flows through a tube to a container outside of the body. It is important to keep the bulb or the drainage container flat (compressed) at all times, except while you empty it. Flattening the bulb or container creates suction. The two most common types of active drains are bulb drains and Hemovac drains.  Passive drains. These drains allow fluid to drain naturally, by gravity. Drainage flows through a tube to a bandage (dressing) or a container outside of the body. Passive drains do not need to be emptied. The most common type of passive drain is the Penrose drain. A drain is placed during surgery. Immediately after surgery, drainage is usually bright red and a little thicker than water. The drainage may gradually turn yellow or pink and become thinner. It is likely that your health care provider will remove the drain when the drainage stops or when the amount decreases to 1-2 Tbsp (15-30 mL) during a 24-hour period. How to care for your surgical drain It is important to care for your drain to prevent infection. If your drain  is placed at your back, or any other hard-to-reach area, ask another person  to assist you in performing the following steps:  Keep the skin around the drain dry and covered with a dressing at all times.  Check your drain area every day for signs of infection. Check for: ? More redness, swelling, or pain. ? Pus or a bad smell. ? Cloudy drainage. Follow instructions from your health care provider about how to take care of your drain and how to change your dressing. Change your dressing at least one time every day. Change it more often if needed to keep the dressing dry. Make sure you: 1. Gather your supplies, including: ? Tape. ? Germ-free cleaning solution (sterile saline). ? Split gauze drain sponge: 4 x 4 inches (10 x 10 cm). ? Gauze square: 4 x 4 inches (10 x 10 cm). 2. Wash your hands with soap and water before you change your dressing. If soap and water are not available, use hand sanitizer. 3. Remove the old dressing. Avoid using scissors to do that. 4. Use sterile saline to clean your skin around the drain. 5. Place the tube through the slit in a drain sponge. Place the drain sponge so that it covers your wound. 6. Place the gauze square or another drain sponge on top of the drain sponge that is on the wound. Make sure the tube is between those layers. 7. Tape the dressing to your skin. 8. If you have an active bulb or Hemovac drain, tape the drainage tube to your skin 1-2 inches (2.5-5 cm) below the place where the tube enters your body. Taping keeps the tube from pulling on any stitches (sutures) that you have. 9. Wash your hands with soap and water. 10. Write down the color of your drainage and how often you change your dressing. How to empty your active bulb or Hemovac drain  1. Make sure that you have a measuring cup that you can empty your drainage into. 2. Wash your hands with soap and water. If soap and water are not available, use hand sanitizer. 3. Gently move your fingers down the tube while squeezing very lightly. This is called stripping the  tube. This clears any drainage, clots, or tissue from the tube. ? Do not pull on the tube. ? You may need to strip the tube several times every day to keep the tube clear. 4. Open the bulb cap or the drain plug. Do not touch the inside of the cap or the bottom of the plug. 5. Empty all of the drainage into the measuring cup. 6. Compress the bulb or the container and replace the cap or the plug. To compress the bulb or the container, squeeze it firmly in the middle while you close the cap or plug the container. 7. Write down the amount of drainage that you have in each 24-hour period. If you have less than 2 Tbsp (30 mL) of drainage during 24 hours, contact your health care provider. 8. Flush the drainage down the toilet. 9. Wash your hands with soap and water. Contact a health care provider if:  You have more redness, swelling, or pain around your drain area.  The amount of drainage that you have is increasing instead of decreasing.  You have pus or a bad smell coming from your drain area.  You have a fever.  You have drainage that is cloudy.  There is a sudden stop or a sudden decrease in the amount of drainage that  you have.  Your tube falls out.  Your active draindoes not stay compressedafter you empty it. Summary  Surgical drains are used to remove extra fluid that normally builds up in a surgical wound after surgery.  Different kinds of surgical drains include active drains and passive drains. Active drains use suction to pull drainage away from the surgical wound, and passive drains allow fluid to drain naturally.  It is important to care for your drain to prevent infection. If your drain is placed at your back, or any other hard-to-reach area, ask another person to assist you.  Contact your health care provider if you have more redness, swelling, or pain around your drain area. This information is not intended to replace advice given to you by your health care provider.  Make sure you discuss any questions you have with your health care provider. Document Released: 06/11/2000 Document Revised: 07/07/2017 Document Reviewed: 01/01/2015 Elsevier Interactive Patient Education  2019 [place] - please call the office for any dark brown/black drainage (bile)  Bring this sheet to all of your post-operative appointments while you have your drains.  Please measure your drains by CC's or ML's.  Make sure you drain and measure your JP Drains 3 times per day.  At the end of each day, add up totals for the left side and add up totals for the right side.    ( 9 am )     ( 3 pm )        ( 9 pm )                Date L  R  L  R  L  R  Total L/R

## 2018-12-01 NOTE — Progress Notes (Addendum)
Daily Rounding Note  12/01/2018, 8:42 AM  LOS: 6 days   SUBJECTIVE:   Chief complaint: bile leak post subtotal chole.  Choledocholithiasis.    Feels well.  Minor pain at drain site.  Less volume of bile output and bile less dark.  Feels well.   Recorded JP drain output 10 mL yesterday  OBJECTIVE:         Vital signs in last 24 hours:    Temp:  [97.7 F (36.5 C)-99.4 F (37.4 C)] 97.7 F (36.5 C) (06/05 0411) Pulse Rate:  [70-80] 70 (06/05 0411) Resp:  [18-21] 18 (06/05 0411) BP: (128-167)/(65-82) 138/81 (06/05 0411) SpO2:  [95 %-100 %] 96 % (06/05 0411) Last BM Date: 11/30/18 Filed Weights   11/25/18 2210 11/26/18 0945  Weight: 87.5 kg 87.5 kg   General: looks well.  Very pleasant   Heart: RRR Chest: clear bil. Abdomen: soft, minor tenderness at drain site RUQ.  Drainage is a pale brown Extremities: No CCE. Neuro/Psych: Fully alert and oriented.  Fluid speech.  Moves all 4 limbs.  No gross deficits.  Calm, relaxed, pleasant.  Intake/Output from previous day: 06/04 0701 - 06/05 0700 In: 800 [I.V.:500; IV Piggyback:300] Out: 10 [Drains:10]  Intake/Output this shift: Total I/O In: 240 [P.O.:240] Out: -   Lab Results: Recent Labs    11/29/18 0437 11/30/18 0604  WBC 7.6 11.2*  HGB 8.7* 9.6*  HCT 26.3* 29.7*  PLT 249 304   BMET Recent Labs    11/29/18 0437 11/30/18 0604  NA 142 141  K 3.1* 3.4*  CL 103 102  CO2 27 25  GLUCOSE 112* 133*  BUN 7* 7*  CREATININE 0.84 0.92  CALCIUM 8.4* 8.9   LFT Recent Labs    11/29/18 0437 11/30/18 0604  PROT 5.8* 6.5  ALBUMIN 2.3* 2.7*  AST 17 16  ALT 17 14  ALKPHOS 91 98  BILITOT 0.9 0.7    Studies/Results: Dg Ercp Biliary & Pancreatic Ducts  Result Date: 11/30/2018 CLINICAL DATA:  Post cholecystectomy, now with concern for bile leak. EXAM: ERCP TECHNIQUE: Multiple spot images obtained with the fluoroscopic device and submitted for interpretation  post-procedure. FLUOROSCOPY TIME:  3 minutes, 33 seconds COMPARISON:  CT abdomen and pelvis - 11/25/2018 FINDINGS: Twelve spot intraoperative fluoroscopic images of the right upper abdominal quadrant during ERCP are provided for review. Initial image demonstrates an ERCP probe overlying the right upper abdominal quadrant. Cholecystectomy clips overlie the gallbladder fossa. Surgical drainage catheter overlies the right upper abdominal quadrant. Subsequent images demonstrate selective cannulation and opacification of the common bile duct which appears moderately dilated. Subsequent images demonstrate insufflation of a balloon within mid/distal aspect of the CBD. There is minimal opacification of the residual cystic duct with apparent extravasation of contrast from the residual cystic duct remnant. Completion images demonstrate placement of a internal metallic biliary stent within distal aspect of the CBD. IMPRESSION: ERCP with findings worrisome for bile leak from the cystic duct remnant with subsequent placement of an internal metallic biliary stent. These images were submitted for radiologic interpretation only. Please see the procedural report for the amount of contrast and the fluoroscopy time utilized. Electronically Signed   By: Sandi Mariscal M.D.   On: 11/30/2018 14:56   Scheduled Meds: . acidophilus  1 capsule Oral Once per day on Mon Wed Fri  . cholecalciferol  1,000 Units Oral Daily  . hydrochlorothiazide  12.5 mg Oral q morning - 10a  .  irbesartan  150 mg Oral Daily  . metoprolol succinate  75 mg Oral Daily  . pantoprazole  40 mg Oral Daily  . senna-docusate  1 tablet Oral QHS  . vitamin C  500 mg Oral Daily   Continuous Infusions: . cefTRIAXone (ROCEPHIN)  IV 2 g (11/29/18 1332)   PRN Meds:.acetaminophen **OR** acetaminophen, diphenhydrAMINE, hydrALAZINE, morphine injection, ondansetron **OR** ondansetron (ZOFRAN) IV, oxyCODONE, traMADol  ASSESMENT:   *   Bile leak post subtotal colectomy  on 11/26/2018.Marland Kitchen Precious ERCP, sphincterotomy, stone removal 11/17/2018. 11/30/2018 ERCP  Dr. Fuller Plan encountered obstructing CBD stone and bile leak.  Previous sphincterotomy.  CBD stone removed.  Covered, metal stent placed to CBD. No signif elevation LFTs during this admission.    *    E. coli bacteremia.  Rocephin day 4  *   Normocytic anemia.    *   Mild hypokalemia.    PLAN   *    Follow-up in GI office with Dr. Henrene Pastor 12/28/18 10:30 AM, virtual visit.  Plan ERCP, stent removal in 2 to 3 months.  *   Surgery advancing diet to low Fat/HH.  Home next 24 hours?   *   Would complete 7 d abx, could complete outpt with DS Bactrim (ecoli resistant to cipro, sens to Bactrim).    Azucena Freed  12/01/2018, 8:42 AM Phone [phone]  GI ATTENDING  Interval history data reviewed.  ERCP report and images reviewed.  Patient personally seen and examined.  Agree with interval progress note as outlined above.  Recommend: 1.  Close surgical monitoring throughout until it is clear that her bile leak has healed (large deficit with a subtotal cholecystectomy as opposed to standard ductal leak) 2.  Discharged on 1 week of appropriate oral antibiotic 3.  Has GI follow-up with me as outlined above. We will sign off.  Call for questions or problems.  Thanks  Docia Chuck. Geri Seminole., M.D. [facility]

## 2018-12-01 NOTE — Progress Notes (Addendum)
1 Day Post-Op  Subjective: CC:  Patient had ERCP done yesterday. Was found to have a 60mm common bile duct stone and bile leak from cystic duct remnant. Reports that she has been pain free since the surgery. Tolerating CLD. No N/V. Passing flatus. Has had episodes of diarrhea after eating. Mobilizing in halls.   Objective: Vital signs in last 24 hours: Temp:  [97.7 F (36.5 C)-99.4 F (37.4 C)] 97.7 F (36.5 C) (06/05 0411) Pulse Rate:  [70-80] 70 (06/05 0411) Resp:  [18-21] 18 (06/05 0411) BP: (128-167)/(65-82) 138/81 (06/05 0411) SpO2:  [95 %-100 %] 96 % (06/05 0411) Last BM Date: 11/30/18  Intake/Output from previous day: 06/04 0701 - 06/05 0700 In: 800 [I.V.:500; IV Piggyback:300] Out: 10 [Drains:10] Intake/Output this shift: Total I/O In: 240 [P.O.:240] Out: -   PE: Gen: Awake and alert, NAD Heart: RRR Lungs: CTA b/l, normal effort Abd: Soft, ND, mild tenderness of RUQ and at drain site. No peritonitis. Drainage pale brown. 10cc overnight. Drain site appears clean. Laparoscopic incisions c/d/i with steri-strips in place. +BS Msk: No edema.   Lab Results:  Recent Labs    11/29/18 0437 11/30/18 0604  WBC 7.6 11.2*  HGB 8.7* 9.6*  HCT 26.3* 29.7*  PLT 249 304   BMET Recent Labs    11/29/18 0437 11/30/18 0604  NA 142 141  K 3.1* 3.4*  CL 103 102  CO2 27 25  GLUCOSE 112* 133*  BUN 7* 7*  CREATININE 0.84 0.92  CALCIUM 8.4* 8.9   PT/INR No results for input(s): LABPROT, INR in the last 72 hours. CMP     Component Value Date/Time   NA 141 11/30/2018 0604   K 3.4 (L) 11/30/2018 0604   CL 102 11/30/2018 0604   CO2 25 11/30/2018 0604   GLUCOSE 133 (H) 11/30/2018 0604   BUN 7 (L) 11/30/2018 0604   CREATININE 0.92 11/30/2018 0604   CREATININE 0.86 03/24/2016 1635   CALCIUM 8.9 11/30/2018 0604   PROT 6.5 11/30/2018 0604   ALBUMIN 2.7 (L) 11/30/2018 0604   AST 16 11/30/2018 0604   ALT 14 11/30/2018 0604   ALKPHOS 98 11/30/2018 0604   BILITOT 0.7  11/30/2018 0604   GFRNONAA >60 11/30/2018 0604   GFRAA >60 11/30/2018 0604   Lipase     Component Value Date/Time   LIPASE 21 11/25/2018 1530       Studies/Results: Dg Ercp Biliary & Pancreatic Ducts  Result Date: 11/30/2018 CLINICAL DATA:  Post cholecystectomy, now with concern for bile leak. EXAM: ERCP TECHNIQUE: Multiple spot images obtained with the fluoroscopic device and submitted for interpretation post-procedure. FLUOROSCOPY TIME:  3 minutes, 33 seconds COMPARISON:  CT abdomen and pelvis - 11/25/2018 FINDINGS: Twelve spot intraoperative fluoroscopic images of the right upper abdominal quadrant during ERCP are provided for review. Initial image demonstrates an ERCP probe overlying the right upper abdominal quadrant. Cholecystectomy clips overlie the gallbladder fossa. Surgical drainage catheter overlies the right upper abdominal quadrant. Subsequent images demonstrate selective cannulation and opacification of the common bile duct which appears moderately dilated. Subsequent images demonstrate insufflation of a balloon within mid/distal aspect of the CBD. There is minimal opacification of the residual cystic duct with apparent extravasation of contrast from the residual cystic duct remnant. Completion images demonstrate placement of a internal metallic biliary stent within distal aspect of the CBD. IMPRESSION: ERCP with findings worrisome for bile leak from the cystic duct remnant with subsequent placement of an internal metallic biliary stent. These images  were submitted for radiologic interpretation only. Please see the procedural report for the amount of contrast and the fluoroscopy time utilized. Electronically Signed   By: Sandi Mariscal M.D.   On: 11/30/2018 14:56    Anti-infectives: Anti-infectives (From admission, onward)   Start     Dose/Rate Route Frequency Ordered Stop   11/27/18 1400  cefTRIAXone (ROCEPHIN) 2 g in sodium chloride 0.9 % 100 mL IVPB     2 g 200 mL/hr over 30  Minutes Intravenous Every 24 hours 11/27/18 1023     11/26/18 1615  piperacillin-tazobactam (ZOSYN) IVPB 3.375 g     3.375 g 100 mL/hr over 30 Minutes Intravenous  Once 11/26/18 1608 11/26/18 1653   11/25/18 2200  piperacillin-tazobactam (ZOSYN) IVPB 3.375 g  Status:  Discontinued     3.375 g 12.5 mL/hr over 240 Minutes Intravenous Every 8 hours 11/25/18 2044 11/27/18 1023   11/25/18 1545  piperacillin-tazobactam (ZOSYN) IVPB 3.375 g     3.375 g 100 mL/hr over 30 Minutes Intravenous  Once 11/25/18 1534 11/25/18 1635       Assessment/Plan HTN HLD  E. Coli Bacteremia Acute Calculous Cholecystitis  S/p ERCP/spincterotomy for choledocholithiasis - 5/22, Dr. Henrene Pastor S/pLaparoscopic FenestratedSubtotalCholecystectomy, Dr. Donne Hazel, 11/26/2018 POD #5 S/p ERCP w/ removal of CBD stone and stenting of the biliary and pancreatitic duct - Dr. Fuller Plan 11/30/2018 -GB was noted to have purulence in surgery and was necrotic - Surgical path - chronic cholecystitis - Bile leak s/p ERCP - Maintain JP drain - Abx per primary team. At least 5 days. Cx w/ sensitivity to oral Bactrim  - Mobilize and IS - If tolerates diet can likely be discharged from a surgical standpoint tomorrow  FEN -Low fat VTE -SCDs. Okay for chemical prophylaxis from a surgical standpoint ID -Zosyn 5/30-6/1. Rocephin 6/1>> (day5) Foley - None Follow up - Dr. Donne Hazel, GI Dr. Henrene Pastor    LOS: 6 days    Jillyn Ledger , [facility] 12/01/2018, 9:11 AM Pager: [phone]

## 2018-12-02 ENCOUNTER — Inpatient Hospital Stay: Payer: Self-pay

## 2018-12-02 LAB — CULTURE, BLOOD (ROUTINE X 2)
Culture: NO GROWTH
Culture: NO GROWTH
Special Requests: ADEQUATE
Special Requests: ADEQUATE

## 2018-12-02 LAB — BASIC METABOLIC PANEL
Anion gap: 13 (ref 5–15)
BUN: 8 mg/dL (ref 8–23)
CO2: 28 mmol/L (ref 22–32)
Calcium: 8.8 mg/dL — ABNORMAL LOW (ref 8.9–10.3)
Chloride: 98 mmol/L (ref 98–111)
Creatinine, Ser: 0.98 mg/dL (ref 0.44–1.00)
GFR calc Af Amer: >60 mL/min (ref >60)
GFR calc non Af Amer: >60 mL/min (ref >60)
Glucose, Bld: 140 mg/dL — ABNORMAL HIGH (ref 70–99)
Potassium: 3.3 mmol/L — ABNORMAL LOW (ref 3.5–5.1)
Sodium: 139 mmol/L (ref 135–145)

## 2018-12-02 MED ORDER — MAGIC MOUTHWASH
5.0000 mL | Freq: Three times a day (TID) | ORAL | Status: DC
  Administered 2018-12-02 – 2018-12-03 (×4): 5 mL via ORAL
  Filled 2018-12-02 (×6): qty 5

## 2018-12-02 MED ORDER — OXYCODONE HCL 5 MG PO TABS: 5.0000 mg | ORAL_TABLET | Freq: Three times a day (TID) | ORAL | 0 refills | Status: AC | PRN

## 2018-12-02 MED ORDER — SODIUM CHLORIDE 0.9 % IV SOLN
2.0000 g | Freq: Once | INTRAVENOUS | Status: AC
  Administered 2018-12-02: 2 g via INTRAVENOUS
  Filled 2018-12-02: qty 20

## 2018-12-02 MED ORDER — MAGIC MOUTHWASH: 5.0000 mL | Freq: Three times a day (TID) | ORAL | 0 refills | Status: DC

## 2018-12-02 MED ORDER — SODIUM CHLORIDE 0.9 % IV SOLN
2.0000 g | INTRAVENOUS | Status: DC
  Filled 2018-12-02: qty 20

## 2018-12-02 MED ORDER — SENNOSIDES-DOCUSATE SODIUM 8.6-50 MG PO TABS: 1.0000 | ORAL_TABLET | Freq: Every day | ORAL | 0 refills | Status: DC

## 2018-12-02 MED FILL — oxyCODONE HCL 5 MG TABS: 5 | 3 days supply | Qty: 9 | Fill #0

## 2018-12-02 MED FILL — STOOL SOFTENER-LAXATIVE TAB: 50-8.6 | 30 days supply | Qty: 30 | Fill #0

## 2018-12-02 MED FILL — MAGIC MOUTHWASH BOP FORM: 8 days supply | Qty: 120 | Fill #0

## 2018-12-02 NOTE — Progress Notes (Signed)
Spoke with Marlowe Kays, RN concerning PICC placement. SL is ordered for home antibiotics. Patient was supposed to discharge today on oral antibiotics, but changed to long terms IV antibiotics. Attempts will be made to be placed today; if not, PICC will be placed tomorrow.

## 2018-12-02 NOTE — Progress Notes (Addendum)
PHARMACY CONSULT NOTE FOR:  OUTPATIENT  PARENTERAL ANTIBIOTIC THERAPY (OPAT)  Indication: E.coli bacteremia Regimen: Ceftriaxone 2g IV q24h End date: 12/09/2018  IV antibiotic discharge orders are pended. To discharging provider:  please sign these orders via discharge navigator,  Select New Orders & click on the button choice - Manage This Unsigned Work.    Note patient was initially to be discharged on PO Bactrim but plan has now changed to complete course with IV antibiotics per ID.  Thank you for allowing pharmacy to be a part of this patient's care.  Mila Merry Gerarda Fraction, PharmD, [place] PGY2 Infectious Diseases Pharmacy Resident Phone: [phone] 12/02/2018, 5:08 PM

## 2018-12-02 NOTE — Progress Notes (Signed)
2 Days Post-Op  Subjective: Doing well and wants to go home. Tolerating diet.  Ambulating independently.  Voiding without difficulty.  Minimal pain  Complains of white patches and discomfort on her tongue. She has thrush and I am starting Magic mouthwash this morning  JP drainage has markedly diminished and is becoming clearer since ERCP with stent  Objective: Vital signs in last 24 hours: Temp:  [98.3 F (36.8 C)-98.8 F (37.1 C)] 98.8 F (37.1 C) (06/06 0418) Pulse Rate:  [72-75] 72 (06/06 0418) Resp:  [16-18] 17 (06/06 0418) BP: (132-158)/(62-79) 132/62 (06/06 0418) SpO2:  [100 %] 100 % (06/06 0418) Last BM Date: 12/01/18  Intake/Output from previous day: 06/05 0701 - 06/06 0700 In: 960 [P.O.:960] Out: 37 [Drains:35; Stool:2] Intake/Output this shift: No intake/output data recorded.   PE: Gen: Awake and alert, NAD, good spirits. Heart: RRR Lungs: CTA b/l, normal effort Abd: Soft, ND, mild tenderness of RUQ and at drain site. No peritonitis. Drainage very thin, brownish-green, 35 cc in 24 hours.Niel Hummer site appears clean. Laparoscopic incisions c/d/i with steri-strips in place. +BS Msk: No edema.    Lab Results:  Results for orders placed or performed during the hospital encounter of 11/25/18 (from the past 24 hour(s))  CBC     Status: Abnormal   Collection Time: 12/01/18  9:20 AM  Result Value Ref Range   WBC 12.0 (H) 4.0 - 10.5 K/uL   RBC 3.57 (L) 3.87 - 5.11 MIL/uL   Hemoglobin 9.4 (L) 12.0 - 15.0 g/dL   HCT 29.1 (L) 36.0 - 46.0 %   MCV 81.5 80.0 - 100.0 fL   MCH 26.3 26.0 - 34.0 pg   MCHC 32.3 30.0 - 36.0 g/dL   RDW 16.6 (H) 11.5 - 15.5 %   Platelets 331 150 - 400 K/uL   nRBC 0.0 0.0 - 0.2 %  Comprehensive metabolic panel     Status: Abnormal   Collection Time: 12/01/18  9:20 AM  Result Value Ref Range   Sodium 140 135 - 145 mmol/L   Potassium 3.2 (L) 3.5 - 5.1 mmol/L   Chloride 101 98 - 111 mmol/L   CO2 27 22 - 32 mmol/L   Glucose, Bld 196 (H) 70 -  99 mg/dL   BUN 6 (L) 8 - 23 mg/dL   Creatinine, Ser 0.84 0.44 - 1.00 mg/dL   Calcium 8.9 8.9 - 10.3 mg/dL   Total Protein 6.6 6.5 - 8.1 g/dL   Albumin 2.6 (L) 3.5 - 5.0 g/dL   AST 17 15 - 41 U/L   ALT 16 0 - 44 U/L   Alkaline Phosphatase 90 38 - 126 U/L   Total Bilirubin 0.5 0.3 - 1.2 mg/dL   GFR calc non Af Amer >60 >60 mL/min   GFR calc Af Amer >60 >60 mL/min   Anion gap 12 5 - 15     Studies/Results: No results found.  Marland Kitchen acidophilus  1 capsule Oral Once per day on Mon Wed Fri  . cholecalciferol  1,000 Units Oral Daily  . hydrochlorothiazide  12.5 mg Oral q morning - 10a  . irbesartan  150 mg Oral Daily  . magic mouthwash  5 mL Oral TID  . metoprolol succinate  75 mg Oral Daily  . pantoprazole  40 mg Oral Daily  . potassium chloride  40 mEq Oral BID  . senna-docusate  1 tablet Oral QHS  . vitamin C  500 mg Oral Daily     Assessment/Plan: s/p Procedure(s): ENDOSCOPIC  RETROGRADE CHOLANGIOPANCREATOGRAPHY (ERCP) WITH PROPOFOL REMOVAL OF STONES BILIARY STENT PLACEMENt   E. Coli Bacteremia Acute Calculous Cholecystitis  S/p ERCP/spincterotomy for choledocholithiasis - 5/22, Dr. Henrene Pastor S/pLaparoscopic FenestratedSubtotalCholecystectomy, Dr. Donne Hazel, 11/26/2018 POD #6 S/p ERCP w/ removal of CBD stone and stenting of the biliary and pancreatitic duct - Dr. Fuller Plan 11/30/2018 -She meets all surgical discharge criteria and should be discharged home today, assuming stable medically - Surgical path - chronic cholecystitis -Bile leak s/p ERCP-resolving - Maintain JP drain at discharge -She may be switched to oral Bactrim at discharge and continue this for 3 more days and then discontinue -We have loaded discharge instructions on the AVS -Follow-ups with Dr. Donne Hazel and with Dr. Henrene Pastor are loaded onto the AVS  Thrush.  I started Magic mouthwash and this should be continued for 5 days or so as outpatient  FEN -Low fat VTE -SCDs. Okay for chemical prophylaxis from a  surgical standpoint ID -Zosyn 5/30-6/1. Rocephin 6/1>> (day5)--agree with oral Bactrim for 3 days post discharge, then discontinue Foley - None Follow up - Dr. Donne Hazel, GI Dr. Henrene Pastor    @PROBHOSP @  LOS: 7 days    Adin Hector 12/02/2018  . .prob

## 2018-12-02 NOTE — Progress Notes (Signed)
PROGRESS NOTE    Janice Rodriguez  YHC:[id number] DOB: 1955-11-15 DOA: 11/25/2018 PCP: [name], DO   Brief Narrative:    Janice Rodriguez a 63 y.o.femalewith medical history significant forhypertensionwho has been having intermittent right upper quadrant abdominal pain which radiates to her back for 3 to 4 weeks. She has been evaluated by her GI physician, Dr. Henrene Pastor. She had abdominal ultrasound on 11/13/2018 which showed a dilated common bile duct. She was started on oral ciprofloxacin. She underwent ERCP on 11/17/2018 was found to have choledocholithiasis status post ERCP with sphincterotomy and successful bile duct stone extraction.Since her ERCP, she has had continued intermittent mild right upper quadrant pain which has been 6-7 out of 10 at its most intense with continued radiation to her back. Pain has been well controlled with as needed Tylenol. This morning she developed shaking chills and fever at home. She had nausea with one episode of bilious emesis. Due to worsening symptoms she presented to the ED for further evaluation.   Assessment & Plan:   Principal Problem:   Cholangitis Active Problems:   Essential hypertension   AKI (acute kidney injury) ([place])   Bile leak, postoperative   Bile leak   Acute calculus cholecystitis/cholangitis s/p lap cholecystectomy with intraoperative cholangiogram and drain placement, s/p bile leak Underwent ERCP showing a CBD Stone which was extracted and a metal stent placed.  She is cleared from surgical stand point for discharge.     E. coli bacteremia likely from GI source Blood cultures show growing E. coli from 11/25/2018 sample.  Patient currently on Rocephin and a repeat blood cultures are negative so far. She completed 7 days of IV antibiotics. Discussed with Dr Prince Rome over the phone regarding the duration of antibiotics, recommended another 7 days of IV antibiotics to complete the course.  Ordered PICC line  and discussed the plan with the patient, who is in agreement.    Oral thrush:  - magic mouth wash ordered by surgeon.   Leukocytosis  Minimal.  Repeat cbc in am.    Hypokalemia Replaced. Bmp tonight.     AKI probably from prerenal causes Improved with hydration.  Continue to  monitor the creatinine.    Chronic anemia of blood loss status post surgery/anemia of chronic disease. Transfuse to keep hemoglobin greater than 7.   Essential hypertension Well-controlled Resume home medications.Marland Kitchen    GERD continue with PPI    DVT prophylaxis: SCDs Code Status: Full code Family Communication: None at bedside, discussed the plan with the patient Disposition Plan: possible d/c tomorrow after PICC line is placed.    Consultants:   General surgery  Gastroenterology  Procedures:  Lap cholecystectomy, intraoperative cholangiogram.  ERCP   Antimicrobials: zosyn on admission followed by rocephin  Day 7.  Subjective:  About 25 ml from drain.  Some pain from the drain site.  No nausea, vomiting or abdominal pain.   Objective: Vitals:   12/01/18 1309 12/01/18 1941 12/02/18 0418 12/02/18 0929  BP: (!) 158/79 134/73 132/62 118/63  Pulse: 73 75 72 92  Resp: 18 16 17    Temp: 98.3 F (36.8 C) 98.4 F (36.9 C) 98.8 F (37.1 C)   TempSrc: Oral Oral Oral   SpO2: 100% 100% 100%   Weight:      Height:        Intake/Output Summary (Last 24 hours) at 12/02/2018 1708 Last data filed at 12/02/2018 1623 Gross per 24 hour  Intake 1060 ml  Output 60 ml  Net 1000 ml  Filed Weights   11/25/18 2210 11/26/18 0945  Weight: 87.5 kg 87.5 kg    Examination:  General exam: Appears calm and comfortable , not in distress HEENT: oral thrush. Slightly painful  Gastrointestinal system: Abdomen is soft , non distended.  tender at the site of the drain. JP drain in place with dark liquid.  Central nervous system: Alert and oriented. Non focal. Extremities: Symmetric 5 x 5 power.  Skin: No rashes, lesions or ulcers Psychiatry:  Mood & affect appropriate.     Data Reviewed: I have personally reviewed following labs and imaging studies  CBC: Recent Labs  Lab 11/27/18 0440 11/28/18 0520 11/29/18 0437 11/30/18 0604 12/01/18 0920  WBC 12.3* 11.1* 7.6 11.2* 12.0*  NEUTROABS 10.5* 7.6  --   --   --   HGB 9.0* 8.5* 8.7* 9.6* 9.4*  HCT 27.7* 26.0* 26.3* 29.7* 29.1*  MCV 81.0 80.2 81.4 81.6 81.5  PLT 208 225 249 304 211   Basic Metabolic Panel: Recent Labs  Lab 11/26/18 0321 11/27/18 0440 11/29/18 0437 11/30/18 0604 12/01/18 0920  NA 140 137 142 141 140  K 3.0* 3.6 3.1* 3.4* 3.2*  CL 104 101 103 102 101  CO2 25 24 27 25 27   GLUCOSE 105* 175* 112* 133* 196*  BUN 5* 5* 7* 7* 6*  CREATININE 0.94 0.88 0.84 0.92 0.84  CALCIUM 8.3* 8.4* 8.4* 8.9 8.9   GFR: Estimated Creatinine Clearance: 74.4 mL/min (by C-G formula based on SCr of 0.84 mg/dL). Liver Function Tests: Recent Labs  Lab 11/26/18 0321 11/27/18 0440 11/29/18 0437 11/30/18 0604 12/01/18 0920  AST 16 44* 17 16 17   ALT 13 28 17 14 16   ALKPHOS 132* 113 91 98 90  BILITOT 1.4* 0.8 0.9 0.7 0.5  PROT 6.1* 6.1* 5.8* 6.5 6.6  ALBUMIN 2.6* 2.4* 2.3* 2.7* 2.6*   No results for input(s): LIPASE, AMYLASE in the last 168 hours. No results for input(s): AMMONIA in the last 168 hours. Coagulation Profile: No results for input(s): INR, PROTIME in the last 168 hours. Cardiac Enzymes: No results for input(s): CKTOTAL, CKMB, CKMBINDEX, TROPONINI in the last 168 hours. BNP (last 3 results) No results for input(s): PROBNP in the last 8760 hours. HbA1C: No results for input(s): HGBA1C in the last 72 hours. CBG: No results for input(s): GLUCAP in the last 168 hours. Lipid Profile: No results for input(s): CHOL, HDL, LDLCALC, TRIG, CHOLHDL, LDLDIRECT in the last 72 hours. Thyroid Function Tests: No results for input(s): TSH, T4TOTAL, FREET4, T3FREE, THYROIDAB in the last 72 hours. Anemia Panel: No  results for input(s): VITAMINB12, FOLATE, FERRITIN, TIBC, IRON, RETICCTPCT in the last 72 hours. Sepsis Labs: No results for input(s): PROCALCITON, LATICACIDVEN in the last 168 hours.  Recent Results (from the past 240 hour(s))  Culture, blood (routine x 2)     Status: Abnormal   Collection Time: 11/25/18  3:30 PM  Result Value Ref Range Status   Specimen Description BLOOD RIGHT ARM  Final   Special Requests   Final    BOTTLES DRAWN AEROBIC AND ANAEROBIC Blood Culture results may not be optimal due to an inadequate volume of blood received in culture bottles   Culture  Setup Time   Final    GRAM NEGATIVE RODS IN BOTH AEROBIC AND ANAEROBIC BOTTLES CRITICAL VALUE NOTED.  VALUE IS CONSISTENT WITH PREVIOUSLY REPORTED AND CALLED VALUE.    Culture (A)  Final    ESCHERICHIA COLI SUSCEPTIBILITIES PERFORMED ON PREVIOUS CULTURE WITHIN THE LAST 5 DAYS.  Performed at [facility], [place] [address] [place], [place] [address]    Report Status 11/28/2018 FINAL  Final  Culture, blood (routine x 2)     Status: Abnormal   Collection Time: 11/25/18  3:30 PM  Result Value Ref Range Status   Specimen Description BLOOD LEFT ARM  Final   Special Requests   Final    BOTTLES DRAWN AEROBIC AND ANAEROBIC Blood Culture results may not be optimal due to an inadequate volume of blood received in culture bottles   Culture  Setup Time   Final    IN BOTH AEROBIC AND ANAEROBIC BOTTLES GRAM NEGATIVE RODS CRITICAL RESULT CALLED TO, READ BACK BY AND VERIFIED WITH: [facility] [place] [id number] FCP Performed at [facility], [place] [address] [place], [place] [address]    Culture ESCHERICHIA COLI (A)  Final   Report Status 11/28/2018 FINAL  Final   Organism ID, Bacteria ESCHERICHIA COLI  Final      Susceptibility   Escherichia coli - MIC*    AMPICILLIN >=32 RESISTANT Resistant     CEFAZOLIN 32 INTERMEDIATE Intermediate     CEFEPIME <=1 SENSITIVE Sensitive     CEFTAZIDIME <=1 SENSITIVE Sensitive      CEFTRIAXONE <=1 SENSITIVE Sensitive     CIPROFLOXACIN >=4 RESISTANT Resistant     GENTAMICIN <=1 SENSITIVE Sensitive     IMIPENEM <=0.25 SENSITIVE Sensitive     TRIMETH/SULFA <=20 SENSITIVE Sensitive     AMPICILLIN/SULBACTAM >=32 RESISTANT Resistant     Extended ESBL NEGATIVE Sensitive     * ESCHERICHIA COLI  Blood Culture ID Panel (Reflexed)     Status: Abnormal   Collection Time: 11/25/18  3:30 PM  Result Value Ref Range Status   Enterococcus species NOT DETECTED NOT DETECTED Final   Listeria monocytogenes NOT DETECTED NOT DETECTED Final   Staphylococcus species NOT DETECTED NOT DETECTED Final   Staphylococcus aureus (BCID) NOT DETECTED NOT DETECTED Final   Streptococcus species NOT DETECTED NOT DETECTED Final   Streptococcus agalactiae NOT DETECTED NOT DETECTED Final   Streptococcus pneumoniae NOT DETECTED NOT DETECTED Final   Streptococcus pyogenes NOT DETECTED NOT DETECTED Final   Acinetobacter baumannii NOT DETECTED NOT DETECTED Final   Enterobacteriaceae species DETECTED (A) NOT DETECTED Final    Comment: Enterobacteriaceae represent a large family of gram-negative bacteria, not a single organism. CRITICAL RESULT CALLED TO, READ BACK BY AND VERIFIED WITH: PHARMD [place] [id number] FCP    Enterobacter cloacae complex NOT DETECTED NOT DETECTED Final   Escherichia coli DETECTED (A) NOT DETECTED Final    Comment: CRITICAL RESULT CALLED TO, READ BACK BY AND VERIFIED WITH: PHARMD MACCIA [id number] FCP    Klebsiella oxytoca NOT DETECTED NOT DETECTED Final   Klebsiella pneumoniae NOT DETECTED NOT DETECTED Final   Proteus species NOT DETECTED NOT DETECTED Final   Serratia marcescens NOT DETECTED NOT DETECTED Final   Carbapenem resistance NOT DETECTED NOT DETECTED Final   Haemophilus influenzae NOT DETECTED NOT DETECTED Final   Neisseria meningitidis NOT DETECTED NOT DETECTED Final   Pseudomonas aeruginosa NOT DETECTED NOT DETECTED Final   Candida albicans NOT DETECTED NOT  DETECTED Final   Candida glabrata NOT DETECTED NOT DETECTED Final   Candida krusei NOT DETECTED NOT DETECTED Final   Candida parapsilosis NOT DETECTED NOT DETECTED Final   Candida tropicalis NOT DETECTED NOT DETECTED Final    Comment: Performed at [facility], [place] [address], [place], [place] [address]  SARS Coronavirus 2 (CEPHEID -  Performed in [facility] hospital lab), Hosp Order     Status: None   Collection Time: 11/25/18  4:50 PM  Result Value Ref Range Status   SARS Coronavirus 2 NEGATIVE NEGATIVE Final    Comment: (NOTE) If result is NEGATIVE SARS-CoV-2 target nucleic acids are NOT DETECTED. The SARS-CoV-2 RNA is generally detectable in upper and lower  respiratory specimens during the acute phase of infection. The lowest  concentration of SARS-CoV-2 viral copies this assay can detect is 250  copies / mL. A negative result does not preclude SARS-CoV-2 infection  and should not be used as the sole basis for treatment or other  patient management decisions.  A negative result may occur with  improper specimen collection / handling, submission of specimen other  than nasopharyngeal swab, presence of viral mutation(s) within the  areas targeted by this assay, and inadequate number of viral copies  (<250 copies / mL). A negative result must be combined with clinical  observations, patient history, and epidemiological information. If result is POSITIVE SARS-CoV-2 target nucleic acids are DETECTED. The SARS-CoV-2 RNA is generally detectable in upper and lower  respiratory specimens dur ing the acute phase of infection.  Positive  results are indicative of active infection with SARS-CoV-2.  Clinical  correlation with patient history and other diagnostic information is  necessary to determine patient infection status.  Positive results do  not rule out bacterial infection or co-infection with other viruses. If result is PRESUMPTIVE POSTIVE SARS-CoV-2 nucleic acids MAY BE  PRESENT.   A presumptive positive result was obtained on the submitted specimen  and confirmed on repeat testing.  While 2019 novel coronavirus  (SARS-CoV-2) nucleic acids may be present in the submitted sample  additional confirmatory testing may be necessary for epidemiological  and / or clinical management purposes  to differentiate between  SARS-CoV-2 and other Sarbecovirus currently known to infect humans.  If clinically indicated additional testing with an alternate test  methodology [phone]) is advised. The SARS-CoV-2 RNA is generally  detectable in upper and lower respiratory sp ecimens during the acute  phase of infection. The expected result is Negative. Fact Sheet for Patients:  StrictlyIdeas.no Fact Sheet for Healthcare Providers: BankingDealers.co.za This test is not yet approved or cleared by the [place] FDA and has been authorized for detection and/or diagnosis of SARS-CoV-2 by FDA under an Emergency Use Authorization (EUA).  This EUA will remain in effect (meaning this test can be used) for the duration of the COVID-19 declaration under Section 564(b)(1) of the Act, 21 U.S.C. section 360bbb-3(b)(1), unless the authorization is terminated or revoked sooner. Performed at [facility], [place] [address], [place], [place] [address]   Surgical pcr screen     Status: None   Collection Time: 11/26/18 12:26 AM  Result Value Ref Range Status   MRSA, PCR NEGATIVE NEGATIVE Final   Staphylococcus aureus NEGATIVE NEGATIVE Final    Comment: (NOTE) The Xpert SA Assay (FDA approved for NASAL specimens in patients 37 years of age and older), is one component of a comprehensive surveillance program. It is not intended to diagnose infection nor to guide or monitor treatment. Performed at [facility], [place] [address] [place], [place] [address]   Culture, blood (routine x 2)     Status: None   Collection Time: 11/27/18   4:35 AM  Result Value Ref Range Status   Specimen Description BLOOD LEFT ANTECUBITAL  Final   Special Requests   Final  BOTTLES DRAWN AEROBIC ONLY Blood Culture adequate volume   Culture   Final    NO GROWTH 5 DAYS Performed at [facility], [place] [address], [place], [place] [address]    Report Status 12/02/2018 FINAL  Final  Culture, blood (routine x 2)     Status: None   Collection Time: 11/27/18  4:40 AM  Result Value Ref Range Status   Specimen Description BLOOD LEFT HAND  Final   Special Requests   Final    BOTTLES DRAWN AEROBIC ONLY Blood Culture adequate volume   Culture   Final    NO GROWTH 5 DAYS Performed at [facility], [place] [address], [place], [place] [address]    Report Status 12/02/2018 FINAL  Final         Radiology Studies: [place] Ekg Site Rite  Result Date: 12/02/2018 If Site Rite image not attached, placement could not be confirmed due to current cardiac rhythm.       Scheduled Meds: . acidophilus  1 capsule Oral Once per day on Mon Wed Fri  . cholecalciferol  1,000 Units Oral Daily  . hydrochlorothiazide  12.5 mg Oral q morning - 10a  . irbesartan  150 mg Oral Daily  . magic mouthwash  5 mL Oral TID  . metoprolol succinate  75 mg Oral Daily  . pantoprazole  40 mg Oral Daily  . senna-docusate  1 tablet Oral QHS  . vitamin C  500 mg Oral Daily   Continuous Infusions: . [START ON 12/03/2018] cefTRIAXone (ROCEPHIN)  IV       LOS: 7 days    Time spent: 26 minutes.     Hosie Poisson, MD [facility] Pager [phone]  If 7PM-7AM, please contact night-coverage [email or web address] Password [email or web address] 12/02/2018, 5:08 PM

## 2018-12-02 NOTE — Progress Notes (Addendum)
Pt given discharge instructions and prescriptions. Instructions gone over with her and pt verbalized understanding. All belongings gathered to be sent home. Pt demonstrates understanding of how to empty and care for JP drain with sheet to record output.  Prescriptions given to pt's daughter so daughter could pick them up from pharmacy because pharmacy is closed tomorrow. Pt aware she may not be discharged till tomorrow due to waiting on PICC line.

## 2018-12-03 LAB — CBC
HCT: 25.8 % — ABNORMAL LOW (ref 36.0–46.0)
Hemoglobin: 8.5 g/dL — ABNORMAL LOW (ref 12.0–15.0)
MCH: 26.2 pg (ref 26.0–34.0)
MCHC: 32.9 g/dL (ref 30.0–36.0)
MCV: 79.4 fL — ABNORMAL LOW (ref 80.0–100.0)
Platelets: 327 10*3/uL (ref 150–400)
RBC: 3.25 MIL/uL — ABNORMAL LOW (ref 3.87–5.11)
RDW: 16.6 % — ABNORMAL HIGH (ref 11.5–15.5)
WBC: 12.5 10*3/uL — ABNORMAL HIGH (ref 4.0–10.5)
nRBC: 0 % (ref 0.0–0.2)

## 2018-12-03 MED ORDER — SODIUM CHLORIDE 0.9% FLUSH
10.0000 mL | INTRAVENOUS | Status: DC | PRN
  Administered 2018-12-03: 10 mL
  Filled 2018-12-03: qty 40

## 2018-12-03 MED ORDER — SODIUM CHLORIDE 0.9 % IV SOLN
2.0000 g | INTRAVENOUS | Status: DC
  Administered 2018-12-03: 2 g via INTRAVENOUS
  Filled 2018-12-03: qty 20

## 2018-12-03 MED ORDER — POTASSIUM CHLORIDE CRYS ER 20 MEQ PO TBCR
40.0000 meq | EXTENDED_RELEASE_TABLET | Freq: Once | ORAL | Status: AC
  Administered 2018-12-03: 40 meq via ORAL
  Filled 2018-12-03: qty 2

## 2018-12-03 MED ORDER — HEPARIN SOD (PORK) LOCK FLUSH 100 UNIT/ML IV SOLN
250.0000 [IU] | INTRAVENOUS | Status: AC | PRN
  Administered 2018-12-03: 250 [IU]

## 2018-12-03 MED ORDER — CEFTRIAXONE IV (FOR PTA / DISCHARGE USE ONLY): 2.0000 g | INTRAVENOUS | 0 refills | Status: AC

## 2018-12-03 NOTE — Progress Notes (Signed)
Peripherally Inserted Central Catheter/Midline Placement  The IV Nurse has discussed with the patient and/or persons authorized to consent for the patient, the purpose of this procedure and the potential benefits and risks involved with this procedure.  The benefits include less needle sticks, lab draws from the catheter, and the patient may be discharged home with the catheter. Risks include, but not limited to, infection, bleeding, blood clot (thrombus formation), and puncture of an artery; nerve damage and irregular heartbeat and possibility to perform a PICC exchange if needed/ordered by physician.  Alternatives to this procedure were also discussed.  Bard Power PICC patient education guide, fact sheet on infection prevention and patient information card has been provided to patient /or left at bedside.    PICC/Midline Placement Documentation  PICC Single Lumen 12/03/18 PICC Right 38 cm 1 cm (Active)  Indication for Insertion or Continuance of Line Home intravenous therapies (PICC only) 12/03/2018  9:06 AM  Exposed Catheter (cm) 1 cm 12/03/2018  9:06 AM  Site Assessment Clean;Dry;Intact 12/03/2018  9:06 AM  Line Status Flushed;Blood return noted;Saline locked 12/03/2018  9:06 AM  Dressing Type Transparent 12/03/2018  9:06 AM  Dressing Status Clean;Dry;Intact;Antimicrobial disc in place 12/03/2018  9:06 AM  Dressing Change Due 12/10/18 12/03/2018  9:06 AM       [address] 12/03/2018, 9:08 AM

## 2018-12-03 NOTE — TOC Initial Note (Signed)
Transition of Care [facility]) - Initial/Assessment Note    Patient Details  Name: Janice Rodriguez MRN: [id number] Date of Birth: 1956/06/23  Transition of Care [facility]) CM/SW Contact:    Pollie Friar, RN Phone Number: 12/03/2018, 11:04 AM  Clinical Narrative:                   Expected Discharge Plan: [place] Services Barriers to Discharge: No Barriers Identified   Patient Goals and CMS Choice   CMS Medicare.gov Compare Post Acute Care list provided to:: Patient Choice offered to / list presented to : Patient  Expected Discharge Plan and Services Expected Discharge Plan: [place]   Discharge Planning Services: CM Consult Post Acute Care Choice: Home Health   Expected Discharge Date: 12/02/18                         HH Arranged: RN [place] Agency: [place] ([place]) Date HH Agency Contacted: 12/03/18 Time [place]: [phone] Representative spoke with at [place]: [place] Arrangements/Services   Lives with:: Spouse Patient language and need for interpreter reviewed:: Yes(no needs) Do you feel safe going back to the place where you live?: Yes            Criminal Activity/Legal Involvement Pertinent to Current Situation/Hospitalization: No - Comment as needed  Activities of Daily Living Home Assistive Devices/Equipment: Blood pressure cuff ADL Screening (condition at time of admission) Patient's cognitive ability adequate to safely complete daily activities?: Yes Is the patient deaf or have difficulty hearing?: No Does the patient have difficulty seeing, even when wearing glasses/contacts?: No Does the patient have difficulty concentrating, remembering, or making decisions?: No Patient able to express need for assistance with ADLs?: Yes Does the patient have difficulty dressing or bathing?: No Independently performs ADLs?: Yes (appropriate for developmental age) Does the patient have difficulty walking  or climbing stairs?: No Weakness of Legs: None Weakness of Arms/Hands: None  Permission Sought/Granted                  Emotional Assessment Appearance:: Appears stated age Attitude/Demeanor/Rapport: Engaged Affect (typically observed): Accepting, Appropriate, Pleasant Orientation: : Oriented to Self, Oriented to Place, Oriented to  Time, Oriented to Situation   Psych Involvement: No (comment)  Admission diagnosis:  Cholangitis [K83.09] Patient Active Problem List   Diagnosis Date Noted  . Bile leak   . Bile leak, postoperative   . Cholangitis 11/25/2018  . AKI (acute kidney injury) ([place]) 11/25/2018  . Bile duct stone   . Other hyperlipidemia 03/30/2017  . Routine general medical examination at a health care facility 11/21/2015  . Vitamin D deficiency 10/17/2014  . Palpitations 11/05/2013  . Moderate obesity 07/08/2010  . Essential hypertension 06/24/2010  . GERD 06/24/2010  . SCOLIOSIS 06/24/2010   PCP:  [name], DO Pharmacy:   [place], [place] [address] Phone: [phone] Fax: [phone]     Social Determinants of Health (SDOH) Interventions    Readmission Risk Interventions No flowsheet data found.

## 2018-12-03 NOTE — TOC Transition Note (Signed)
Transition of Care [facility]) - CM/SW Discharge Note   Patient Details  Name: Janice Rodriguez MRN: [id number] Date of Birth: December 31, 1955  [facility] CM/SW Contact:  Pollie Friar, RN Phone Number: 12/03/2018, 11:04 AM   Clinical Narrative:    Jeannene Patella with [facility] is aware of d/c. Plan is for patient to have first dose at home tomorrow.  Pt has transportation home when medically ready.   Final next level of care: Home w Home Health Services Barriers to Discharge: No Barriers Identified   Patient Goals and CMS Choice   CMS Medicare.gov Compare Post Acute Care list provided to:: Patient Choice offered to / list presented to : Patient  Discharge Placement                       Discharge Plan and Services   Discharge Planning Services: CM Consult Post Acute Care Choice: Home Health                    HH Arranged: RN [place]: [place] ([place]) Date [facility] Agency Contacted: 12/03/18 Time [place]: [phone] Representative spoke with at [place]: [place] ([place]) Interventions     Readmission Risk Interventions No flowsheet data found.

## 2018-12-03 NOTE — Progress Notes (Signed)
3 Days Post-Op  Subjective: Apparently, infectious disease and medicine advised another 7 days of IV antibiotics, so PICC line was just inserted this morning to facilitate that as an outpatient. Otherwise the patient feels well and is anxious to go home Tolerating diet.  Ambulating independently.  Voiding without difficulty.  Pain extremely well controlled  Currently on Magic mouthwash for thrush and that is somewhat better symptomatically.  JP drainage is low and much less bilious.  Objective: Vital signs in last 24 hours: Temp:  [99.7 F (37.6 C)-100.5 F (38.1 C)] 100 F (37.8 C) (06/07 0502) Pulse Rate:  [79-88] 79 (06/07 0502) Resp:  [18] 18 (06/07 0502) BP: (125-129)/(62-68) 126/68 (06/07 0502) SpO2:  [95 %-98 %] 97 % (06/07 0502) Last BM Date: 12/01/18  Intake/Output from previous day: 06/06 0701 - 06/07 0700 In: 940 [P.O.:840; IV Piggyback:100] Out: 65 [Drains:65] Intake/Output this shift: Total I/O In: 120 [P.O.:120] Out: -     PE: Gen: Awake and alert, NAD, good spirits. Heart: RRR Lungs: CTA b/l, normal effort Abd: Soft, ND, mild tenderness of RUQ and at drain site. No peritonitis. Drainage very thin, brownish-green, 65 cc in 24 hours.Niel Hummer site appears clean. Laparoscopic incisions c/d/i with steri-strips in place.+BS Msk: No edema.   Lab Results:  Results for orders placed or performed during the hospital encounter of 11/25/18 (from the past 24 hour(s))  Basic metabolic panel     Status: Abnormal   Collection Time: 12/02/18  7:02 PM  Result Value Ref Range   Sodium 139 135 - 145 mmol/L   Potassium 3.3 (L) 3.5 - 5.1 mmol/L   Chloride 98 98 - 111 mmol/L   CO2 28 22 - 32 mmol/L   Glucose, Bld 140 (H) 70 - 99 mg/dL   BUN 8 8 - 23 mg/dL   Creatinine, Ser 0.98 0.44 - 1.00 mg/dL   Calcium 8.8 (L) 8.9 - 10.3 mg/dL   GFR calc non Af Amer >60 >60 mL/min   GFR calc Af Amer >60 >60 mL/min   Anion gap 13 5 - 15  CBC     Status: Abnormal   Collection Time:  12/03/18  3:00 AM  Result Value Ref Range   WBC 12.5 (H) 4.0 - 10.5 K/uL   RBC 3.25 (L) 3.87 - 5.11 MIL/uL   Hemoglobin 8.5 (L) 12.0 - 15.0 g/dL   HCT 25.8 (L) 36.0 - 46.0 %   MCV 79.4 (L) 80.0 - 100.0 fL   MCH 26.2 26.0 - 34.0 pg   MCHC 32.9 30.0 - 36.0 g/dL   RDW 16.6 (H) 11.5 - 15.5 %   Platelets 327 150 - 400 K/uL   nRBC 0.0 0.0 - 0.2 %     Studies/Results: [place] Ekg Site Rite  Result Date: 12/02/2018 If Site Rite image not attached, placement could not be confirmed due to current cardiac rhythm.   Marland Kitchen acidophilus  1 capsule Oral Once per day on Mon Wed Fri  . cholecalciferol  1,000 Units Oral Daily  . hydrochlorothiazide  12.5 mg Oral q morning - 10a  . irbesartan  150 mg Oral Daily  . magic mouthwash  5 mL Oral TID  . metoprolol succinate  75 mg Oral Daily  . pantoprazole  40 mg Oral Daily  . senna-docusate  1 tablet Oral QHS  . vitamin C  500 mg Oral Daily     Assessment/Plan: s/p Procedure(s): ENDOSCOPIC RETROGRADE CHOLANGIOPANCREATOGRAPHY (ERCP) WITH PROPOFOL REMOVAL OF STONES BILIARY STENT PLACEMENT  E. Coli Bacteremia  Acute Calculous Cholecystitis  S/p ERCP/spincterotomy for choledocholithiasis - 5/22, Dr. Henrene Pastor S/pLaparoscopic FenestratedSubtotalCholecystectomy, Dr. Donne Hazel, 5/31/2020POD #7 S/p ERCP w/ removal of CBD stone and stenting of the biliary and pancreatitic duct - Dr. Fuller Plan 11/30/2018 -She meets all surgical discharge criteria and should be discharged home today, assuming stable medically - Surgical path - chronic cholecystitis -Bile leaks/p ERCP-resolving - Maintain JP drain at discharge -We have loaded discharge instructions on the AVS -Follow-ups with Dr. Donne Hazel and with Dr. Henrene Pastor are loaded onto the AVS  Thrush.  I started Magic mouthwash (6/6)  and this should be continued for 5 days or so as outpatient  FEN -Low fat VTE -SCDs.Okay for chemical prophylaxisfrom a surgical standpoint ID -Per medicine service, PICC line  inserted.  She will receive IV antibiotics for 7 more days as outpatient. Foley - None Follow up - Dr. Beau Fanny),   GI Dr. Benn Moulder Dr. Justin Mend)  @PROBHOSP @  LOS: 8 days    Adin Hector 12/03/2018  . .prob

## 2018-12-03 NOTE — Progress Notes (Signed)
Pt discharged home with a prescription for IV antibiotics. Also had a picc line inserted for home use by home health. Condition stable for discharge, pt had no concerns regarding discharge

## 2018-12-03 NOTE — Progress Notes (Signed)
Spoke with Suezanne Jacquet, RN concerning PICC placement. No discharge date set. PICC will be placed this morning.

## 2018-12-04 DIAGNOSIS — R7881 Bacteremia: Secondary | ICD-10-CM | POA: Diagnosis not present

## 2018-12-04 MED FILL — POTASSIUM CHLORIDE CRYS ER: 20 | 90 days supply | Qty: 90 | Fill #2

## 2018-12-04 MED FILL — CANDESARTAN CILEXETIL 16 MG: 16 | 90 days supply | Qty: 90 | Fill #2

## 2018-12-05 NOTE — Discharge Summary (Signed)
Physician Discharge Summary  Janice Rodriguez JFH:[id number] DOB: 1956-03-09 DOA: 11/25/2018  PCP: Janice Rodriguez  Admit date: 11/25/2018 Discharge date: 12/03/2018  Admitted From: Home.  Disposition:  Home.   Recommendations for Outpatient Follow-up:  1. Follow up with PCP in 1-2 weeks 2. Please obtain BMP/CBC in one week 3. Please follow up with surgery as recommended.   Home Health:yes  Discharge Condition:stable.  CODE STATUS: full code.  Diet recommendation: Heart Healthy    Brief/Interim Summary: Janice Mathews-Betheais a 63 y.o.femalewith medical history significant forhypertensionwho has been having intermittent right upper quadrant abdominal pain which radiates to her back for 3 to 4 weeks. She has been evaluated by her GI physician, Dr. Henrene Rodriguez. She had abdominal ultrasound on 11/13/2018 which showed a dilated common bile duct. She was started on oral ciprofloxacin. She underwent ERCP on 11/17/2018 was found to have choledocholithiasis status post ERCP with sphincterotomy and successful bile duct stone extraction.Since her ERCP, she has had continued intermittent mild right upper quadrant pain which has been 6-7 out of 10 at its most intense with continued radiation to her back. Pain has been well controlled with as needed Tylenol. This Rodriguez she developed shaking chills and fever at home. She had nausea with one episode of bilious emesis. Due to worsening symptoms she presented to the ED for further evaluation.  Discharge Diagnoses:  Principal Problem:   Cholangitis Active Problems:   Essential hypertension   AKI (acute kidney injury) ([place])   Bile leak, postoperative   Bile leak  Acute calculus cholecystitis/cholangitis s/p lap cholecystectomy with intraoperative cholangiogram and drain placement, s/p bile leak Underwent ERCP showing a CBD Stone which was extracted and a metal stent placed.  She is cleared from surgical stand point for discharge.      E. coli bacteremia likely from GI source Blood cultures show growing E. coli from 11/25/2018 sample.  Patient currently on Rocephin and a repeat blood cultures are negative so far. She completed 7 days of IV antibiotics. Discussed with Dr Prince Rome over the phone regarding the duration of antibiotics, recommended another 7 days of IV antibiotics to complete the course.  Ordered PICC line and discussed the plan with the patient, who is in agreement.  She will finish the course on discharge.  Pt had low grade fever today, but she is clinically stable and denies any nausea, vomiting or abdominal pain. And her recent blood cultures have been negative.    Oral thrush:  - magic mouth wash ordered by surgeon.   Leukocytosis  Minimal.  Recommend checking CBC in one week.    Hypokalemia Replaced.    AKI probably from prerenal causes Improved with hydration.  Continue to  monitor the creatinine.    Chronic anemia of blood loss status post surgery/anemia of chronic disease. Transfuse to keep hemoglobin greater than 7.   Essential hypertension Well-controlled Resume home medications.Marland Kitchen    GERD continue with PPI    Discharge Instructions  Discharge Instructions    Diet - low sodium heart healthy   Complete by:  As directed    Discharge instructions   Complete by:  As directed    Please follow up with surgery and GI as recommended.   Home infusion instructions [facility] May follow [place] Dosing Protocol; May administer Cathflo as needed to maintain patency of vascular access device.; Flushing of vascular access device: per [facility] Protocol: 0.9% NaCl pre/post medica...   Complete by:  As directed    Instructions:  May  follow [place] Dosing Protocol   Instructions:  May administer Cathflo as needed to maintain patency of vascular access device.   Instructions:  Flushing of vascular access device: per [facility] Protocol: 0.9% NaCl pre/post medication  administration and prn patency; Heparin 100 u/ml, 67m for implanted ports and Heparin 10u/ml, 548mfor all other central venous catheters.   Instructions:  May follow [facility] Anaphylaxis Protocol for First Dose Administration in the home: 0.9% NaCl at 25-50 ml/hr to maintain IV access for protocol meds. Epinephrine 0.3 ml IV/IM PRN and Benadryl 25-50 IV/IM PRN s/s of anaphylaxis.   Instructions:  [facility] Coordinator (RN) to assist per patient IV care needs in the home PRN.     Allergies as of 12/03/2018   No Known Allergies     Medication List    STOP taking these medications   ciprofloxacin 500 MG tablet Commonly known as:  CIPRO     TAKE these medications   acetaminophen 500 MG tablet Commonly known as:  TYLENOL Take 1,000 mg by mouth every 6 (six) hours as needed (pain).   aspirin EC 81 MG tablet Take 81 mg by mouth daily.   candesartan 16 MG tablet Commonly known as:  ATACAND TAKE 1 TABLET BY MOUTH ONCE DAILY What changed:  when to take this   cefTRIAXone  IVPB Commonly known as:  ROCEPHIN Inject 2 g into the vein daily for 6 days. Indication:  E.coli bacteremia Last Day of Therapy:  12/09/2018 Labs - Once weekly:  CBC/D and BMP Labs - Every other week:  ESR and CRP   esomeprazole 40 MG capsule Commonly known as:  NexIUM Take 1 capsule (40 mg total) by mouth 2 (two) times daily before a meal. What changed:  when to take this   hydrochlorothiazide 12.5 MG capsule Commonly known as:  MICROZIDE Take 1 capsule (12.5 mg total) by mouth every Rodriguez.   magic mouthwash Soln Take 5 mLs by mouth 3 (three) times daily.   metoprolol succinate 50 MG 24 hr tablet Commonly known as:  TOPROL-XL Take 1.5 tablets (75 mg) by mouth daily with or immediately following a meal. What changed:    how much to take  how to take this  when to take this  additional instructions   oxyCODONE 5 MG immediate release tablet Commonly known as:  Oxy IR/ROXICODONE Take 1  tablet (5 mg total) by mouth every 8 (eight) hours as needed for up to 3 days for severe pain or breakthrough pain.   potassium chloride SA 20 MEQ tablet Commonly known as:  K-DUR Take 1 tablet (20 mEq total) by mouth daily.   PROBIOTIC DAILY PO Take 1 capsule by mouth 3 (three) times a week. Reported on 11/21/2015   TUMS E-X PO Take 1 tablet by mouth daily as needed (indigestion/ acid reflux).   vitamin C 500 MG tablet Commonly known as:  ASCORBIC ACID Take 500 mg by mouth daily.   VITAMIN D3 PO Take 1 tablet by mouth daily.            Home Infusion Instuctions  (From admission, onward)         Start     Ordered   12/03/18 0000  Home infusion instructions [facility] May follow [facility] Protocol; May administer Cathflo as needed to maintain patency of vascular access device.; Flushing of vascular [facility]: 0.9% NaCl pre/post medica...    Question Answer Comment  Instructions May follow  [facility] Dosing Protocol   Instructions May administer Cathflo as needed to maintain patency of vascular access device.   Instructions Flushing of vascular access device: per [facility] Protocol: 0.9% NaCl pre/post medication administration and prn patency; Heparin 100 u/ml, 61m for implanted ports and Heparin 10u/ml, 567mfor all other central venous catheters.   Instructions May follow [facility] Anaphylaxis Protocol for First Dose Administration in the home: 0.9% NaCl at 25-50 ml/hr to maintain IV access for protocol meds. Epinephrine 0.3 ml IV/IM PRN and Benadryl 25-50 IV/IM PRN s/s of anaphylaxis.   Instructions [facility] Infusion Coordinator (RN) to assist per patient IV care needs in the home PRN.      12/03/18 1051         Follow-up Information    WaRolm BookbinderMD. Call on 12/15/2018.   Specialty:  General Surgery Why:  06/19 arrive at 10:50 for 11:20 appointment with Dr WaDonne HazelPlease bring a copy of your photo ID and insurance card.   Contact information: [address] [place] [phone]        PeIrene ShipperMD Follow up on 12/28/2018.   Specialty:  Gastroenterology Why:  10:30 AM.  follow up on stent and bile leak Contact information: [phone]        CoJanie MorningDO. Schedule an appointment as soon as possible for a visit in 1 week(s).   Specialty:  Family Medicine Contact information: [phone]        SmBelva CromeMD .   Specialty:  Cardiology Contact information: [phone]. [address] [place] [id number] Follow up.   Specialty:  [place]:  [phone]       No Known Allergies  Consultations:  Surgery  Gastroenterology.    Procedures/Studies: UsKoreabdomen Complete  Result Date: 11/13/2018 CLINICAL DATA:  Epigastric pain, vomiting EXAM: ABDOMEN ULTRASOUND COMPLETE COMPARISON:  None. FINDINGS: Gallbladder: Contracted. Gallbladder wall thickening. There are ring down artifact from the anterior wall compatible with adenomyomatosis. 2 cm gallstone within the gallbladder. Common bile duct: Diameter: Dilated, 14 mm. Liver: Intrahepatic biliary ductal dilatation. No focal hepatic abnormality. Portal vein is patent on color Doppler imaging with normal direction of blood flow towards the liver. IVC: No abnormality visualized. Pancreas: Visualized portion unremarkable. Spleen: Not visualized due to bowel gas. Right Kidney: Length: 11.5 cm. Echogenicity within normal limits. No mass or hydronephrosis visualized. Left Kidney: Length: 9.5 cm. Visualization very limited. No hydronephrosis. Abdominal aorta: No aneurysm visualized. Other findings: None. IMPRESSION: Contracted gallbladder with gallstones and evidence of adenomyomatosis. There is intrahepatic and extrahepatic biliary ductal  dilatation. Common bile duct measures up to 14 mm. Cannot visualize the distal common bile duct due to overlying bowel gas. Cannot exclude distal CBD stone. Consider further evaluation with MRCP or ERCP. Electronically Signed   By: KeRolm Baptise.D.   On: 11/13/2018 14:10   Ct Abdomen Pelvis W Contrast  Result Date: 11/25/2018 CLINICAL DATA:  Right upper quadrant pain. ERCP Nov 17, 2018. Nausea for several days. Fever last night. Vomiting this Rodriguez. EXAM: CT ABDOMEN AND PELVIS WITH CONTRAST TECHNIQUE: Multidetector CT imaging of the abdomen and pelvis was performed using the standard protocol following bolus administration of intravenous contrast. CONTRAST:  8043mMNIPAQUE IOHEXOL 300 MG/ML  SOLN COMPARISON:  Ultrasound Nov 13, 2018 FINDINGS: Lower chest: There is a small hiatal hernia. The lung bases and lower chest are otherwise normal. Hepatobiliary: No liver masses are identified. The gallbladder is somewhat thick walled. There is fat stranding adjacent to the gallbladder. There is also a small amount of fluid in the right upper quadrant, extending inferior to the gallbladder. There is air in the bile ducts consistent with recent ERCP and sphincterotomy. Intra and extrahepatic biliary duct prominence remains. Portal vein is patent. Pancreas: The fat stranding in fluid in the right upper quadrant abuts the pancreatic head. However, the pancreas itself is grossly unremarkable. Spleen: Normal in size without focal abnormality. Adrenals/Urinary Tract: Adrenal glands are normal. The kidneys are unremarkable. No hydronephrosis or perinephric stranding. The ureters are normal. The bladder is unremarkable. Stomach/Bowel: There is a small hiatal hernia. The stomach is otherwise normal. The duodenum is unremarkable. The remainder of the small bowel is normal. Colonic diverticulosis is seen without diverticulitis. Visualized appendix is normal. Vascular/Lymphatic: Atherosclerotic changes are seen in the tortuous  nonaneurysmal aorta. No adenopathy. Reproductive: Status post hysterectomy. No adnexal masses. Other: No abdominal wall hernia or abnormality. No abdominopelvic ascites. Musculoskeletal: Scoliotic curvature lumbar spine, apex to the right. Degenerative changes in the thoracolumbar spine including the facets. IMPRESSION: 1. There is mild gallbladder wall thickening. There is fat stranding and fluid in the right upper quadrant abutting the duodenum, gallbladder, and pancreatic head. Findings are not specific but concerning for acute cholecystitis or cholangitis. The patient has a normal lipase today making pancreatitis unlikely. The duodenum demonstrates no definitive wall thickening making duodenitis less likely. Electronically Signed   By: Dorise Bullion III M.D   On: 11/25/2018 18:18   Dg Ercp Biliary & Pancreatic Ducts  Result Date: 11/30/2018 CLINICAL DATA:  Post cholecystectomy, now with concern for bile leak. EXAM: ERCP TECHNIQUE: Multiple spot images obtained with the fluoroscopic device and submitted for interpretation post-procedure. FLUOROSCOPY TIME:  3 minutes, 33 seconds COMPARISON:  CT abdomen and pelvis - 11/25/2018 FINDINGS: Twelve spot intraoperative fluoroscopic images of the right upper abdominal quadrant during ERCP are provided for review. Initial image demonstrates an ERCP probe overlying the right upper abdominal quadrant. Cholecystectomy clips overlie the gallbladder fossa. Surgical drainage catheter overlies the right upper abdominal quadrant. Subsequent images demonstrate selective cannulation and opacification of the common bile duct which appears moderately dilated. Subsequent images demonstrate insufflation of a balloon within mid/distal aspect of the CBD. There is minimal opacification of the residual cystic duct with apparent extravasation of contrast from the residual cystic duct remnant. Completion images demonstrate placement of a internal metallic biliary stent within distal  aspect of the CBD. IMPRESSION: ERCP with findings worrisome for bile leak from the cystic duct remnant with subsequent placement of an internal metallic biliary stent. These images were submitted for radiologic interpretation only. Please see the procedural report for the amount of contrast and the fluoroscopy time utilized. Electronically Signed   By: Sandi Mariscal M.D.   On: 11/30/2018 14:56   Dg Ercp Biliary & Pancreatic Ducts  Result Date: 11/17/2018 CLINICAL DATA:  Bile duct stone. EXAM: ERCP TECHNIQUE: Multiple spot images obtained with the fluoroscopic device and submitted for interpretation post-procedure. FLUOROSCOPY TIME:  Fluoroscopy Time:  6 minutes and 7 seconds Radiation Exposure Index (if provided by the fluoroscopic device): 177 COMPARISON:  Ultrasound 11/13/2018 FINDINGS: The pancreatic duct was cannulated on the first images and part of the pancreatic duct was opacified. Subsequently, the common bile duct was cannulated and biliary system  was opacified. The biliary system is dilated. Biliary stones are not well demonstrated but the reportedly present based on the procedure note. Balloon sweep was performed for stone removal. IMPRESSION: ERCP for stone removal. These images were submitted for radiologic interpretation only. Please see the procedural report for the amount of contrast and the fluoroscopy time utilized. Electronically Signed   By: Markus Daft M.D.   On: 11/17/2018 16:23   [place] Ekg Site Rite  Result Date: 12/02/2018 If Site Rite image not attached, placement could not be confirmed due to current cardiac rhythm.      Subjective: No nausea, chest pain , abd pain resolved.   Discharge Exam: Vitals:   12/02/18 2257 12/03/18 0502  BP: 129/65 126/68  Pulse: 84 79  Resp: 18 18  Temp: 99.7 F (37.6 C) 100 F (37.8 C)  SpO2: 95% 97%   Vitals:   12/02/18 0929 12/02/18 2025 12/02/18 2257 12/03/18 0502  BP: 118/63 125/62 129/65 126/68  Pulse: 92 88 84 79  Resp:  18 18 18    Temp:  (!) 100.5 F (38.1 C) 99.7 F (37.6 C) 100 F (37.8 C)  TempSrc:  Oral Oral Oral  SpO2:  98% 95% 97%  Weight:      Height:        General: Pt is alert, awake, not in acute distress Cardiovascular: RRR, S1/S2 +, no rubs, no gallops Respiratory: CTA bilaterally, no wheezing, no rhonchi Abdominal: Soft, NT, ND, bowel sounds + Extremities: no edema, no cyanosis    The results of significant diagnostics from this hospitalization (including imaging, microbiology, ancillary and laboratory) are listed below for reference.     Microbiology: Recent Results (from the past 240 hour(s))  Surgical pcr screen     Status: None   Collection Time: 11/26/18 12:26 AM  Result Value Ref Range Status   MRSA, PCR NEGATIVE NEGATIVE Final   Staphylococcus aureus NEGATIVE NEGATIVE Final    Comment: (NOTE) The Xpert SA Assay (FDA approved for NASAL specimens in patients 37 years of age and older), is one component of a comprehensive surveillance program. It is not intended to diagnose infection nor to guide or monitor treatment. Performed at [facility], [place] [address], [place], [place] [address]   Culture, blood (routine x 2)     Status: None   Collection Time: 11/27/18  4:35 AM  Result Value Ref Range Status   Specimen Description BLOOD LEFT ANTECUBITAL  Final   Special Requests   Final    BOTTLES DRAWN AEROBIC ONLY Blood Culture adequate volume   Culture   Final    NO GROWTH 5 DAYS Performed at [facility], [place] [address], [place], [place] [address]    Report Status 12/02/2018 FINAL  Final  Culture, blood (routine x 2)     Status: None   Collection Time: 11/27/18  4:40 AM  Result Value Ref Range Status   Specimen Description BLOOD LEFT HAND  Final   Special Requests   Final    BOTTLES DRAWN AEROBIC ONLY Blood Culture adequate volume   Culture   Final    NO GROWTH 5 DAYS Performed at [facility], [place] [address] [place], [place] [address]    Report Status  12/02/2018 FINAL  Final     Labs: BNP (last 3 results) No results for input(s): BNP in the last 8760 hours. Basic Metabolic Panel: Recent Labs  Lab 11/29/18 0437 11/30/18 0604 12/01/18 0920 12/02/18 1902  NA  142 141 140 139  K 3.1* 3.4* 3.2* 3.3*  CL 103 102 101 98  CO2 27 25 27 28   GLUCOSE 112* 133* 196* 140*  BUN 7* 7* 6* 8  CREATININE 0.84 0.92 0.84 0.98  CALCIUM 8.4* 8.9 8.9 8.8*   Liver Function Tests: Recent Labs  Lab 11/29/18 0437 11/30/18 0604 12/01/18 0920  AST 17 16 17   ALT 17 14 16   ALKPHOS 91 98 90  BILITOT 0.9 0.7 0.5  PROT 5.8* 6.5 6.6  ALBUMIN 2.3* 2.7* 2.6*   No results for input(s): LIPASE, AMYLASE in the last 168 hours. No results for input(s): AMMONIA in the last 168 hours. CBC: Recent Labs  Lab 11/29/18 0437 11/30/18 0604 12/01/18 0920 12/03/18 0300  WBC 7.6 11.2* 12.0* 12.5*  HGB 8.7* 9.6* 9.4* 8.5*  HCT 26.3* 29.7* 29.1* 25.8*  MCV 81.4 81.6 81.5 79.4*  PLT 249 304 331 327   Cardiac Enzymes: No results for input(s): CKTOTAL, CKMB, CKMBINDEX, TROPONINI in the last 168 hours. BNP: Invalid input(s): POCBNP CBG: No results for input(s): GLUCAP in the last 168 hours. D-Dimer No results for input(s): DDIMER in the last 72 hours. Hgb A1c No results for input(s): HGBA1C in the last 72 hours. Lipid Profile No results for input(s): CHOL, HDL, LDLCALC, TRIG, CHOLHDL, LDLDIRECT in the last 72 hours. Thyroid function studies No results for input(s): TSH, T4TOTAL, T3FREE, THYROIDAB in the last 72 hours.  Invalid input(s): FREET3 Anemia work up No results for input(s): VITAMINB12, FOLATE, FERRITIN, TIBC, IRON, RETICCTPCT in the last 72 hours. Urinalysis    Component Value Date/Time   COLORURINE YELLOW 11/26/2018 0050   APPEARANCEUR CLEAR 11/26/2018 0050   LABSPEC 1.030 11/26/2018 0050   PHURINE 5.0 11/26/2018 0050   GLUCOSEU NEGATIVE 11/26/2018 0050   GLUCOSEU NEGATIVE 10/17/2014 0909   HGBUR NEGATIVE 11/26/2018 0050   BILIRUBINUR  NEGATIVE 11/26/2018 0050   KETONESUR 5 (A) 11/26/2018 0050   PROTEINUR NEGATIVE 11/26/2018 0050   UROBILINOGEN 0.2 10/17/2014 0909   NITRITE NEGATIVE 11/26/2018 0050   LEUKOCYTESUR LARGE (A) 11/26/2018 0050   Sepsis Labs Invalid input(s): PROCALCITONIN,  WBC,  LACTICIDVEN Microbiology Recent Results (from the past 240 hour(s))  Surgical pcr screen     Status: None   Collection Time: 11/26/18 12:26 AM  Result Value Ref Range Status   MRSA, PCR NEGATIVE NEGATIVE Final   Staphylococcus aureus NEGATIVE NEGATIVE Final    Comment: (NOTE) The Xpert SA Assay (FDA approved for NASAL specimens in patients 37 years of age and older), is one component of a comprehensive surveillance program. It is not intended to diagnose infection nor to guide or monitor treatment. Performed at [facility], [place] [address], [place], [place] [address]   Culture, blood (routine x 2)     Status: None   Collection Time: 11/27/18  4:35 AM  Result Value Ref Range Status   Specimen Description BLOOD LEFT ANTECUBITAL  Final   Special Requests   Final    BOTTLES DRAWN AEROBIC ONLY Blood Culture adequate volume   Culture   Final    NO GROWTH 5 DAYS Performed at [facility], [place] [address] [place], [place] [address]    Report Status 12/02/2018 FINAL  Final  Culture, blood (routine x 2)     Status: None   Collection Time: 11/27/18  4:40 AM  Result Value Ref Range Status   Specimen Description BLOOD LEFT HAND  Final   Special Requests   Final  BOTTLES DRAWN AEROBIC ONLY Blood Culture adequate volume   Culture   Final    NO GROWTH 5 DAYS Performed at [facility], [place] [address] [place], [place] [address]    Report Status 12/02/2018 FINAL  Final     Time coordinating discharge: 35 minutes  SIGNED:   Hosie Poisson, MD  [facility] 12/05/2018, 10:15 PM Pager   If 7PM-7AM, please contact night-coverage [email or web address] Password [email or web address]

## 2018-12-06 ENCOUNTER — Other Ambulatory Visit: Payer: Self-pay | Admitting: *Deleted

## 2018-12-06 NOTE — Patient Outreach (Signed)
[facility]) Care Management  12/06/2018  Janice Rodriguez 01/29/1956 [id number]  Transition of care telephone call  Referral received:  11/27/18 Initial outreach: 12/06/18 Insurance: [employer]  Initial unsuccessful telephone call to patient's home and mobile numbers in order to complete transition of care assessment; no answer on either number, left HIPAA compliant voicemail message on mobile number requesting return call.   Objective: Per the electronic medical record, Janice Rodriguez  was hospitalized at [facility] from 5/30-12/03/18 with cholangitis and gangrenous cholecystitis. . On 11/26/18 she underwent a laparoscopic fenestrating subtotal cholecystectomy with drain placement. Blood cultures from 11/25/18 showed E. coli so she was started on Rocephin IV. On 11/29/18 she had an ERCP  with extraction of a common bile duct stone and placement of a biliary metal stent related to a bile leak. Comorbidities include: HTN and obesity She was discharged to home with orders for [place] to provide Rocephin IV daily x 7 days via a PICC line per the discharge summary.   Plan: This RNCM will route unsuccessful outreach letter with [facility] [employer] pamphlet and 24 hour Nurse Advice Line Magnet to [facility] clinical pool to be mailed to patient's home address. This RNCM will attempt another outreach within 4 business days.

## 2018-12-07 DIAGNOSIS — R7881 Bacteremia: Secondary | ICD-10-CM | POA: Diagnosis not present

## 2018-12-07 DIAGNOSIS — A419 Sepsis, unspecified organism: Secondary | ICD-10-CM | POA: Diagnosis not present

## 2018-12-11 ENCOUNTER — Encounter: Payer: Self-pay | Admitting: *Deleted

## 2018-12-11 ENCOUNTER — Other Ambulatory Visit: Payer: Self-pay | Admitting: *Deleted

## 2018-12-11 NOTE — Patient Outreach (Signed)
[facility]) Care Management  12/11/2018  Janice Rodriguez 1956-03-07 [id number]  Transition of care telephone call/Case closure  Referral received:  11/27/18 Initial outreach: 12/06/18 Insurance: [employer]  Subjective: Second attempt to reach Janice Rodriguez at home number successful; 2 HIPAA identifiers verified. Explained purpose of call and completed transition of care assessment.  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  Her 2 adult daughters that live nearby are assisting with her recovery. She states her husband is unable to help because he will be having surgery next week.  She says she has made all follow up appointments as directed at discharge except for her cardiologist, Dr. Pernell Dupre.  She says she received her last IV Rocephin on Saturday 12/09/18 but she still has the PICC line in place in case she requires more Rocephin.   Objective: Per the electronic medical record, Janice Rodriguez was hospitalized at [facility] from 5/30-12/03/18 with cholangitis and gangrenous cholecystitis.  On 11/26/18 she underwent a laparoscopicfenestrating subtotal cholecystectomy with drain placement. Blood cultures from 11/25/18 showed E. coli so she was started on Rocephin IV. On 11/29/18 she had an ERCP  with extraction of a common bile duct stone and placement of a biliary metal stent related to a bile leak. Comorbidities include: HTN and obesity She was discharged to home with orders for [place] to provide Rocephin IV daily x 7 days via a PICC line per the discharge summary.   Assessment:  Patient voices good understanding of all discharge instructions.  See transition of care  flowsheet for assessment details.  Plan:  Reviewed hospital discharge diagnosis of cholangitis and laparoscopicfenestrating subtotal cholecystectomy with drain placement and treatment plan using hospital discharge instructions, assessing medication adherence, reviewing postoperative problems requiring provider notification, and discussing the importance of follow up with surgeon, primary care provider and specialists as directed. No ongoing care management needs identified so will close case to [place] [facility] care management services. Referred Janice Rodriguez to the [facility] brochure that was mailed to her home on 6/10 should future care management needs arise.   Barrington Ellison RN,CCM,CDE [place] Management Coordinator Office Phone [phone] Office Fax [phone]

## 2018-12-13 DIAGNOSIS — R7881 Bacteremia: Secondary | ICD-10-CM | POA: Diagnosis not present

## 2018-12-27 ENCOUNTER — Telehealth: Payer: Self-pay

## 2018-12-27 DIAGNOSIS — R7881 Bacteremia: Secondary | ICD-10-CM | POA: Diagnosis not present

## 2018-12-27 DIAGNOSIS — D649 Anemia, unspecified: Secondary | ICD-10-CM | POA: Diagnosis not present

## 2018-12-27 DIAGNOSIS — Z9049 Acquired absence of other specified parts of digestive tract: Secondary | ICD-10-CM | POA: Diagnosis not present

## 2018-12-27 DIAGNOSIS — Z7189 Other specified counseling: Secondary | ICD-10-CM | POA: Diagnosis not present

## 2018-12-27 DIAGNOSIS — I10 Essential (primary) hypertension: Secondary | ICD-10-CM | POA: Diagnosis not present

## 2018-12-27 NOTE — Telephone Encounter (Signed)
Phone screening complete 

## 2018-12-28 ENCOUNTER — Ambulatory Visit (INDEPENDENT_AMBULATORY_CARE_PROVIDER_SITE_OTHER): Payer: 59 | Admitting: Internal Medicine

## 2018-12-28 ENCOUNTER — Encounter: Payer: Self-pay | Admitting: Internal Medicine

## 2018-12-28 VITALS — Ht 66.0 in | Wt 190.0 lb

## 2018-12-28 DIAGNOSIS — K839 Disease of biliary tract, unspecified: Secondary | ICD-10-CM | POA: Diagnosis not present

## 2018-12-28 NOTE — Patient Instructions (Signed)
You have been scheduled for an ERCP. Please follow written instructions given to you at your visit today.  If you use inhalers (even only as needed), please bring them with you on the day of your procedure.

## 2018-12-28 NOTE — Addendum Note (Signed)
Addended by: Audrea Muscat on: 12/28/2018 02:54 PM   Modules accepted: Orders

## 2018-12-28 NOTE — Progress Notes (Signed)
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  The patient was evaluated in this office Nov 02, 2018 regarding 44-month history of intermittent epigastric pain with nausea and vomiting.  Revealed abnormal liver tests with biliary dilation for which she underwent ERCP with sphincterotomy and successful stone extraction on Nov 17, 2018.  She presented to the hospital 1 week later with acute cholecystitis and underwent laparoscopic fenestrating subtotal cholecystectomy Dr. Donne Hazel Nov 26, 2018.  She developed a postoperative bile duct leak and underwent ERCP with Dr. Fuller Plan November 30, 2018.  1 common duct stone was removed and a 4 cm in length/10 mm in diameter covered metal stent was inserted into the distal bile duct.  Patient was discharged home on December 02, 2018.  She tells me that since her hospital discharge she has been feeling well.  She did have a postoperative follow-up with Dr. Donne Hazel.  She had no bilious drainage and her percutaneous drain was removed.  She has had no issues since removal of her tube.  She tells me that she "feels great".  She is looking forward to returning to work.  Last liver test December 01, 2018 were normal.  REVIEW OF SYSTEMS:  All non-GI ROS negative unless otherwise stated in HPI   Past Medical History:  Diagnosis Date  . ASYMPTOMATIC POSTMENOPAUSAL STATUS   . Blood transfusion without reported diagnosis 1980's  . Female cystocele   . Gall stones, common bile duct    dialated bile duct per [place] called today 11-13-2018  . GERD   . History of irregular heartbeat   . HYPERLIPIDEMIA   . HYPERTENSION   . Left atrial enlargement 12/2013   Noted on ECHO  . LVH (left ventricular hypertrophy) 12/2013   Mild, noted on ECHO  . OBESITY   . SCOLIOSIS   . 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 UMBILICAL HERNIORRHAPHY, HX OF    age 59  . Vitamin D deficiency   . Wears glasses     Past Surgical History:  Procedure Laterality Date  . BILIARY STENT PLACEMENT  11/30/2018   Procedure: BILIARY STENT PLACEMENT;  Surgeon: Ladene Artist, MD;  Location: [facility] ENDOSCOPY;  Service: Endoscopy;;  . CHOLECYSTECTOMY N/A 11/26/2018   Procedure: LAPAROSCOPIC CHOLECYSTECTOMY WITH INTRAOPERATIVE CHOLANGIOGRAM;  Surgeon: Rolm Bookbinder, MD;  Location: [facility];  Service: General;  Laterality: N/A;  . COLONOSCOPY  2012   [place]  . COLONOSCOPY  10/2015  . [place]  . ENDOSCOPIC RETROGRADE CHOLANGIOPANCREATOGRAPHY (ERCP) WITH PROPOFOL N/A 11/17/2018   Procedure: ENDOSCOPIC RETROGRADE CHOLANGIOPANCREATOGRAPHY (ERCP) WITH PROPOFOL;  Surgeon: Irene Shipper, MD;  Location: [facility] ENDOSCOPY;  Service: Endoscopy;  Laterality: N/A;  . ENDOSCOPIC RETROGRADE CHOLANGIOPANCREATOGRAPHY (ERCP) WITH PROPOFOL N/A 11/30/2018   Procedure: ENDOSCOPIC RETROGRADE CHOLANGIOPANCREATOGRAPHY (ERCP) WITH PROPOFOL;  Surgeon: Ladene Artist, MD;  Location: [facility] ENDOSCOPY;  Service: Endoscopy;  Laterality: N/A;  . REMOVAL OF STONES  11/17/2018   Procedure: REMOVAL OF STONES;  Surgeon: Irene Shipper, MD;  Location: [facility] ENDOSCOPY;  Service: Endoscopy;;  . REMOVAL OF STONES  11/30/2018   Procedure: REMOVAL OF STONES;  Surgeon: Ladene Artist, MD;  Location: [facility] ENDOSCOPY;  Service: Endoscopy;;  . Janice Rodriguez  11/17/2018   Procedure: SPHINCTEROTOMY;  Surgeon: Irene Shipper, MD;  Location: [facility] ENDOSCOPY;  Service: Endoscopy;;  . [place]  . UPPER GASTROINTESTINAL ENDOSCOPY  10/2015  . VAGINAL HYSTERECTOMY  1992    Social History Janice Rodriguez  reports that she has never smoked. She has never used smokeless tobacco. She reports current alcohol use. She reports that she does not use drugs.  family history includes  Breast cancer in her mother; Colon cancer in her father and paternal aunt; Diabetes in her brother and another family member; Diabetes (age of onset: 64) in her mother; Heart attack in her brother; Heart disease in her father; Hyperlipidemia in an other family member; Hypertension in an other family member; Sarcoidosis in her brother; Stroke in her brother and father.  No Known Allergies     PHYSICAL EXAMINATION: Physical examination with telehealth visit   ASSESSMENT:  1.  Choledocholithiasis status post ERCP with common duct stone extraction Nov 17, 2018 2.  Acute cholecystitis status post laparoscopic fenestrated subtotal cholecystectomy Nov 25, 2018 3.  Postoperative bile leak status post repeat ERCP with 4 cm long/10 mm diameter covered SEM placement November 30, 2018.  Doing well thereafter and has had her percutaneous drain removed without issue 4.  History of adenomatous colon polyps.  Colon cancer.  Surveillance up-to-date (last examination May 2017)   PLAN:  1.  SCHEDULE ERCP with stent removal at the hospital with general anesthesia.  This has been scheduled for Monday, July 27.The nature of the procedure, as well as the risks, benefits, and alternatives were carefully and thoroughly reviewed with the patient. Ample time for discussion and questions allowed. The patient understood, was satisfied, and agreed to proceed. 2.  Preoperative antibiotics in the form of 400 mg ciprofloxacin IV  This telehealth medicine visit was initiated by consented for by the patient.  She was in her home and I was in my office during the encounter.  She understands her may be associated fashion charge for the service the coronavirus pandemic

## 2018-12-28 NOTE — H&P (View-Only) (Signed)
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  The patient was evaluated in this office Nov 02, 2018 regarding 43-month history of intermittent epigastric pain with nausea and vomiting.  Revealed abnormal liver tests with biliary dilation for which she underwent ERCP with sphincterotomy and successful stone extraction on Nov 17, 2018.  She presented to the hospital 1 week later with acute cholecystitis and underwent laparoscopic fenestrating subtotal cholecystectomy Dr. Donne Hazel Nov 26, 2018.  She developed a postoperative bile duct leak and underwent ERCP with Dr. Fuller Plan November 30, 2018.  1 common duct stone was removed and a 4 cm in length/10 mm in diameter covered metal stent was inserted into the distal bile duct.  Patient was discharged home on December 02, 2018.  She tells me that since her hospital discharge she has been feeling well.  She did have a postoperative follow-up with Dr. Donne Hazel.  She had no bilious drainage and her percutaneous drain was removed.  She has had no issues since removal of her tube.  She tells me that she "feels great".  She is looking forward to returning to work.  Last liver test December 01, 2018 were normal.  REVIEW OF SYSTEMS:  All non-GI ROS negative unless otherwise stated in HPI   Past Medical History:  Diagnosis Date  . ASYMPTOMATIC POSTMENOPAUSAL STATUS   . Blood transfusion without reported diagnosis 1980's  . Female cystocele   . Gall stones, common bile duct    dialated bile duct per [place] called today 11-13-2018  . GERD   . History of irregular heartbeat   . HYPERLIPIDEMIA   . HYPERTENSION   . Left atrial enlargement 12/2013   Noted on ECHO  . LVH (left ventricular hypertrophy) 12/2013   Mild, noted on ECHO  . OBESITY   . SCOLIOSIS   . 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 UMBILICAL HERNIORRHAPHY, HX OF    age 28  . Vitamin D deficiency   . Wears glasses     Past Surgical History:  Procedure Laterality Date  . BILIARY STENT PLACEMENT  11/30/2018   Procedure: BILIARY STENT PLACEMENT;  Surgeon: Ladene Artist, MD;  Location: [facility] ENDOSCOPY;  Service: Endoscopy;;  . CHOLECYSTECTOMY N/A 11/26/2018   Procedure: LAPAROSCOPIC CHOLECYSTECTOMY WITH INTRAOPERATIVE CHOLANGIOGRAM;  Surgeon: Rolm Bookbinder, MD;  Location: [place];  Service: General;  Laterality: N/A;  . COLONOSCOPY  2012   Tics  . COLONOSCOPY  10/2015  . [place]  . ENDOSCOPIC RETROGRADE CHOLANGIOPANCREATOGRAPHY (ERCP) WITH PROPOFOL N/A 11/17/2018   Procedure: ENDOSCOPIC RETROGRADE CHOLANGIOPANCREATOGRAPHY (ERCP) WITH PROPOFOL;  Surgeon: Irene Shipper, MD;  Location: [facility] ENDOSCOPY;  Service: Endoscopy;  Laterality: N/A;  . ENDOSCOPIC RETROGRADE CHOLANGIOPANCREATOGRAPHY (ERCP) WITH PROPOFOL N/A 11/30/2018   Procedure: ENDOSCOPIC RETROGRADE CHOLANGIOPANCREATOGRAPHY (ERCP) WITH PROPOFOL;  Surgeon: Ladene Artist, MD;  Location: [facility] ENDOSCOPY;  Service: Endoscopy;  Laterality: N/A;  . REMOVAL OF STONES  11/17/2018   Procedure: REMOVAL OF STONES;  Surgeon: Irene Shipper, MD;  Location: [facility] ENDOSCOPY;  Service: Endoscopy;;  . REMOVAL OF STONES  11/30/2018   Procedure: REMOVAL OF STONES;  Surgeon: Ladene Artist, MD;  Location[facility] ENDOSCOPY;  Service: Endoscopy;;  . [name]  11/17/2018   Procedure: SPHINCTEROTOMY;  Surgeon: Irene Shipper, MD;  Location: [facility] ENDOSCOPY;  Service: Endoscopy;;  . [place]  . UPPER GASTROINTESTINAL ENDOSCOPY  10/2015  . VAGINAL HYSTERECTOMY  1992    Social History Truth Wolaver  reports that she has never smoked. She has never used smokeless tobacco. She reports current alcohol use. She reports that she does not use drugs.  family history includes  Breast cancer in her mother; Colon cancer in her father and paternal aunt; Diabetes in her brother and another family member; Diabetes (age of onset: 90) in her mother; Heart attack in her brother; Heart disease in her father; Hyperlipidemia in an other family member; Hypertension in an other family member; Sarcoidosis in her brother; Stroke in her brother and father.  No Known Allergies     PHYSICAL EXAMINATION: Physical examination with telehealth visit   ASSESSMENT:  1.  Choledocholithiasis status post ERCP with common duct stone extraction Nov 17, 2018 2.  Acute cholecystitis status post laparoscopic fenestrated subtotal cholecystectomy Nov 25, 2018 3.  Postoperative bile leak status post repeat ERCP with 4 cm long/10 mm diameter covered SEM placement November 30, 2018.  Doing well thereafter and has had her percutaneous drain removed without issue 4.  History of adenomatous colon polyps.  Colon cancer.  Surveillance up-to-date (last examination May 2017)   PLAN:  1.  SCHEDULE ERCP with stent removal at the hospital with general anesthesia.  This has been scheduled for Monday, July 27.The nature of the procedure, as well as the risks, benefits, and alternatives were carefully and thoroughly reviewed with the patient. Ample time for discussion and questions allowed. The patient understood, was satisfied, and agreed to proceed. 2.  Preoperative antibiotics in the form of 400 mg ciprofloxacin IV  This telehealth medicine visit was initiated by consented for by the patient.  She was in her home and I was in my office during the encounter.  She understands her may be associated fashion charge for the service the coronavirus pandemic

## 2019-01-11 MED FILL — ESOMEPRAZOLE MAG DR 40 MG C: 40 | 30 days supply | Qty: 60 | Fill #1

## 2019-01-18 ENCOUNTER — Other Ambulatory Visit: Payer: Self-pay

## 2019-01-18 ENCOUNTER — Other Ambulatory Visit (HOSPITAL_COMMUNITY)
Admission: RE | Admit: 2019-01-18 | Discharge: 2019-01-18 | Disposition: A | Discharge status: Home / Self Care | Payer: 59 | Source: Ambulatory Visit | Attending: Internal Medicine | Admitting: Internal Medicine

## 2019-01-18 ENCOUNTER — Other Ambulatory Visit (HOSPITAL_COMMUNITY): Payer: 59

## 2019-01-18 ENCOUNTER — Encounter (HOSPITAL_COMMUNITY): Payer: Self-pay | Admitting: *Deleted

## 2019-01-18 DIAGNOSIS — Z1159 Encounter for screening for other viral diseases: Secondary | ICD-10-CM | POA: Diagnosis not present

## 2019-01-18 LAB — SARS CORONAVIRUS 2 (TAT 6-24 HRS): SARS Coronavirus 2: NEGATIVE

## 2019-01-22 ENCOUNTER — Other Ambulatory Visit: Payer: Self-pay

## 2019-01-22 ENCOUNTER — Ambulatory Visit (HOSPITAL_COMMUNITY)
Admission: RE | Admit: 2019-01-22 | Discharge: 2019-01-22 | Disposition: A | Discharge status: Home / Self Care | Payer: 59 | Attending: Internal Medicine | Admitting: Internal Medicine

## 2019-01-22 ENCOUNTER — Ambulatory Visit (HOSPITAL_COMMUNITY): Payer: 59 | Admitting: Certified Registered"

## 2019-01-22 ENCOUNTER — Encounter (HOSPITAL_COMMUNITY): Payer: Self-pay | Admitting: Internal Medicine

## 2019-01-22 ENCOUNTER — Encounter (HOSPITAL_COMMUNITY)
Admission: RE | Disposition: A | Discharge status: Home / Self Care | Payer: Self-pay | Source: Home / Self Care | Attending: Internal Medicine

## 2019-01-22 ENCOUNTER — Ambulatory Visit (HOSPITAL_COMMUNITY): Payer: 59

## 2019-01-22 DIAGNOSIS — Z9049 Acquired absence of other specified parts of digestive tract: Secondary | ICD-10-CM | POA: Diagnosis not present

## 2019-01-22 DIAGNOSIS — K838 Other specified diseases of biliary tract: Secondary | ICD-10-CM | POA: Insufficient documentation

## 2019-01-22 DIAGNOSIS — K839 Disease of biliary tract, unspecified: Secondary | ICD-10-CM

## 2019-01-22 DIAGNOSIS — M419 Scoliosis, unspecified: Secondary | ICD-10-CM | POA: Diagnosis not present

## 2019-01-22 DIAGNOSIS — Z09 Encounter for follow-up examination after completed treatment for conditions other than malignant neoplasm: Secondary | ICD-10-CM | POA: Diagnosis not present

## 2019-01-22 DIAGNOSIS — I10 Essential (primary) hypertension: Secondary | ICD-10-CM | POA: Diagnosis not present

## 2019-01-22 DIAGNOSIS — K8309 Other cholangitis: Secondary | ICD-10-CM | POA: Diagnosis not present

## 2019-01-22 DIAGNOSIS — T85520A Displacement of bile duct prosthesis, initial encounter: Secondary | ICD-10-CM | POA: Insufficient documentation

## 2019-01-22 DIAGNOSIS — Y838 Other surgical procedures as the cause of abnormal reaction of the patient, or of later complication, without mention of misadventure at the time of the procedure: Secondary | ICD-10-CM | POA: Diagnosis not present

## 2019-01-22 DIAGNOSIS — E7849 Other hyperlipidemia: Secondary | ICD-10-CM | POA: Diagnosis not present

## 2019-01-22 HISTORY — PX: ERCP: SHX5425

## 2019-01-22 SURGERY — ERCP, WITH INTERVENTION IF INDICATED
Anesthesia: General

## 2019-01-22 MED ORDER — ONDANSETRON HCL 4 MG/2ML IJ SOLN
INTRAMUSCULAR | Status: DC | PRN
  Administered 2019-01-22: 4 mg via INTRAVENOUS

## 2019-01-22 MED ORDER — FENTANYL CITRATE (PF) 100 MCG/2ML IJ SOLN
INTRAMUSCULAR | Status: AC
  Filled 2019-01-22: qty 2

## 2019-01-22 MED ORDER — GLUCAGON HCL RDNA (DIAGNOSTIC) 1 MG IJ SOLR
INTRAMUSCULAR | Status: AC
  Filled 2019-01-22: qty 1

## 2019-01-22 MED ORDER — FENTANYL CITRATE (PF) 100 MCG/2ML IJ SOLN
INTRAMUSCULAR | Status: DC | PRN
  Administered 2019-01-22 (×2): 50 ug via INTRAVENOUS

## 2019-01-22 MED ORDER — CIPROFLOXACIN IN D5W 400 MG/200ML IV SOLN
400.0000 mg | Freq: Once | INTRAVENOUS | Status: AC
  Administered 2019-01-22: 11:00:00 400 mg via INTRAVENOUS

## 2019-01-22 MED ORDER — CIPROFLOXACIN IN D5W 400 MG/200ML IV SOLN
INTRAVENOUS | Status: AC
  Filled 2019-01-22: qty 200

## 2019-01-22 MED ORDER — EPHEDRINE SULFATE-NACL 50-0.9 MG/10ML-% IV SOSY
PREFILLED_SYRINGE | INTRAVENOUS | Status: DC | PRN
  Administered 2019-01-22: 5 mg via INTRAVENOUS
  Administered 2019-01-22: 10 mg via INTRAVENOUS

## 2019-01-22 MED ORDER — LACTATED RINGERS IV SOLN
INTRAVENOUS | Status: DC
  Administered 2019-01-22: 11:00:00 1000 mL via INTRAVENOUS

## 2019-01-22 MED ORDER — LIDOCAINE 2% (20 MG/ML) 5 ML SYRINGE
INTRAMUSCULAR | Status: DC | PRN
  Administered 2019-01-22: 100 mg via INTRAVENOUS

## 2019-01-22 MED ORDER — DEXAMETHASONE SODIUM PHOSPHATE 10 MG/ML IJ SOLN
INTRAMUSCULAR | Status: DC | PRN
  Administered 2019-01-22: 10 mg via INTRAVENOUS

## 2019-01-22 MED ORDER — SODIUM CHLORIDE 0.9 % IV SOLN
INTRAVENOUS | Status: DC | PRN
  Administered 2019-01-22: 50 mL

## 2019-01-22 MED ORDER — SODIUM CHLORIDE 0.9 % IV SOLN: INTRAVENOUS | Status: DC

## 2019-01-22 MED ORDER — INDOMETHACIN 50 MG RE SUPP
RECTAL | Status: DC | PRN
  Administered 2019-01-22: 100 mg via RECTAL

## 2019-01-22 MED ORDER — MIDAZOLAM HCL 5 MG/5ML IJ SOLN
INTRAMUSCULAR | Status: DC | PRN
  Administered 2019-01-22: 2 mg via INTRAVENOUS

## 2019-01-22 MED ORDER — SUCCINYLCHOLINE CHLORIDE 200 MG/10ML IV SOSY
PREFILLED_SYRINGE | INTRAVENOUS | Status: DC | PRN
  Administered 2019-01-22: 100 mg via INTRAVENOUS

## 2019-01-22 MED ORDER — INDOMETHACIN 50 MG RE SUPP
RECTAL | Status: AC
  Filled 2019-01-22: qty 2

## 2019-01-22 MED ORDER — PHENYLEPHRINE 40 MCG/ML (10ML) SYRINGE FOR IV PUSH (FOR BLOOD PRESSURE SUPPORT)
PREFILLED_SYRINGE | INTRAVENOUS | Status: DC | PRN
  Administered 2019-01-22: 80 ug via INTRAVENOUS

## 2019-01-22 MED ORDER — PROPOFOL 10 MG/ML IV BOLUS
INTRAVENOUS | Status: DC | PRN
  Administered 2019-01-22: 130 mg via INTRAVENOUS

## 2019-01-22 MED ORDER — MIDAZOLAM HCL 2 MG/2ML IJ SOLN
INTRAMUSCULAR | Status: AC
  Filled 2019-01-22: qty 2

## 2019-01-22 NOTE — Discharge Instructions (Signed)
YOU HAD AN ENDOSCOPIC PROCEDURE TODAY: Refer to the procedure report and other information in the discharge instructions given to you for any specific questions about what was found during the examination. If this information does not answer your questions, please call [place] office at [phone] to clarify.  ° °YOU SHOULD EXPECT: Some feelings of bloating in the abdomen. Passage of more gas than usual. Walking can help get rid of the air that was put into your GI tract during the procedure and reduce the bloating. If you had a lower endoscopy (such as a colonoscopy or flexible sigmoidoscopy) you may notice spotting of blood in your stool or on the toilet paper. Some abdominal soreness may be present for a day or two, also. ° °DIET: Your first meal following the procedure should be a light meal and then it is ok to progress to your normal diet. A half-sandwich or bowl of soup is an example of a good first meal. Heavy or fried foods are harder to digest and may make you feel nauseous or bloated. Drink plenty of fluids but you should avoid alcoholic beverages for 24 hours. If you had a esophageal dilation, please see attached instructions for diet.   ° °ACTIVITY: Your care partner should take you home directly after the procedure. You should plan to take it easy, moving slowly for the rest of the day. You can resume normal activity the day after the procedure however YOU SHOULD NOT DRIVE, use power tools, machinery or perform tasks that involve climbing or major physical exertion for 24 hours (because of the sedation medicines used during the test).  ° °SYMPTOMS TO REPORT IMMEDIATELY: °A gastroenterologist can be reached at any hour. Please call [phone]  for any of the following symptoms:  °Following lower endoscopy (colonoscopy, flexible sigmoidoscopy) °Excessive amounts of blood in the stool  °Significant tenderness, worsening of abdominal pains  °Swelling of the abdomen that is new, acute  °Fever of 100° or  higher  °Following upper endoscopy (EGD, EUS, ERCP, esophageal dilation) °Vomiting of blood or coffee ground material  °New, significant abdominal pain  °New, significant chest pain or pain under the shoulder blades  °Painful or persistently difficult swallowing  °New shortness of breath  °Black, tarry-looking or red, bloody stools ° °FOLLOW UP:  °If any biopsies were taken you will be contacted by phone or by letter within the next 1-3 weeks. Call [phone]  if you have not heard about the biopsies in 3 weeks.  °Please also call with any specific questions about appointments or follow up tests. ° °

## 2019-01-22 NOTE — Op Note (Signed)
[facility] Patient Name: Janice Rodriguez Procedure Date: 01/22/2019 MRN: [id number] Attending MD: Docia Chuck. Henrene Pastor , MD Date of Birth: Jun 19, 1956 CSN: [id number] Age: 63 Admit Type: Inpatient Procedure:                ERCP Indications:              Follow-up of bile leak. The patient underwent ERCP                            with sphincterotomy and common bile duct stone                            extraction Nov 17, 2018. Presented to the hospital                            1 week later with acute cholecystitis for which she                            underwent laparoscopic fenestrating subtotal                            cholecystectomy Nov 26, 2018. This was complicated                            by postoperative bile duct leak for which she                            underwent ERCP with stent placement (4 cm in                            length/10 mm in diameter totally covered                            self-expanding metal stent) on November 30, 2018. Has                            since had her percutaneous drain removed and is                            feeling well with normal liver tests. Providers:                Docia Chuck. Henrene Pastor, MD, Cleda Daub, RN, Ashley Jacobs, RN, Elspeth Cho Tech., Technician Referring MD:             Mila Homer. Donne Hazel, MD Medicines:                General Anesthesia Complications:            No immediate complications. Estimated Blood Loss:     Estimated blood loss: none. Procedure:                Pre-Anesthesia Assessment:                           -  Prior to the procedure, a History and Physical                            was performed, and patient medications and                            allergies were reviewed. The patient is competent.                            The risks and benefits of the procedure and the                            sedation options and risks were discussed with the                 patient. All questions were answered and informed                            consent was obtained. Patient identification and                            proposed procedure were verified by the physician.                            Mental Status Examination: alert and oriented.                            Airway Examination: normal oropharyngeal airway and                            neck mobility. Respiratory Examination: clear to                            auscultation. CV Examination: normal. Prophylactic                            Antibiotics: The patient does not require                            prophylactic antibiotics. Prior Anticoagulants: The                            patient has taken no previous anticoagulant or                            antiplatelet agents. ASA Grade Assessment: II - A                            patient with mild systemic disease. After reviewing                            the risks and benefits, the patient was deemed in                            satisfactory condition  to undergo the procedure.                            The anesthesia plan was to use moderate sedation /                            analgesia (conscious sedation). Immediately prior                            to administration of medications, the patient was                            re-assessed for adequacy to receive sedatives. The                            heart rate, respiratory rate, oxygen saturations,                            blood pressure, adequacy of pulmonary ventilation,                            and response to care were monitored throughout the                            procedure. The physical status of the patient was                            re-assessed after the procedure. Intravenous                            ciprofloxacin was provided preprocedure. 400 mg IV.                           After obtaining informed consent, the scope was                             passed under direct vision. Throughout the                            procedure, the patient's blood pressure, pulse, and                            oxygen saturations were monitored continuously. The                            [id number]-Q180V ([id number] Olympus duodenoscope was                            introduced through the mouth, and used to inject                            contrast into and used to inject contrast into the  bile duct. The ERCP was accomplished without                            difficulty. The patient tolerated the procedure                            well. Findings:      The esophagus was successfully intubated under direct vision. The scope       was advanced to a normal major papilla in the descending duodenum       without detailed examination of the pharynx, larynx and associated       structures, and upper GI tract. The upper GI tract was grossly normal.       There was no evidence of the previously placed covered metal stent. The       bile duct was deeply cannulated with the sphincterotome. Contrast was       injected. I personally interpreted the bile duct images. Opacification       of the entire biliary tree was successful. A biliary balloon was then       inflated to maximal 12 mm and pulled through the biliary tree several       times. There was mild biliary dilation. The entire opacified area was       otherwise normal. No evidence for leak, stricture, or retained stones. A       subtotal cholecystectomy had been performed. The pancreatic duct was not       injected during this procedure. Impression:               1. Previously placed covered biliary stent has had                            interval migration out of the bile duct                           2. The biliary tree was slightly dilated but                            otherwise normal status post laparoscopic                            fenestrating cholecystectomy                            3. Previous bile leak has healed.                           4. No manipulation of the pancreatic duct by intent Moderate Sedation:      none Recommendation:           1. Standard post ERCP observation                           2. May go home today if doing well                           3. Resume general medical care with your primary  provider. GI follow-up as needed. Procedure Code(s):        --- Professional ---                           [phone], Endoscopic retrograde                            cholangiopancreatography (ERCP); diagnostic,                            including collection of specimen(s) by brushing or                            washing, when performed (separate procedure) Diagnosis Code(s):        --- Professional ---                           Z90.49, Acquired absence of other specified parts                            of digestive tract                           K83.8, Other specified diseases of biliary tract CPT copyright 2019 American Medical Association. All rights reserved. The codes documented in this report are preliminary and upon coder review may  be revised to meet current compliance requirements. Docia Chuck. Henrene Pastor, MD 01/22/2019 12:07:55 PM This report has been signed electronically. Number of Addenda: 0

## 2019-01-22 NOTE — Anesthesia Procedure Notes (Addendum)
Procedure Name: Intubation Date/Time: 01/22/2019 11:23 AM Performed by: Silas Sacramento, CRNA Pre-anesthesia Checklist: Patient identified, Emergency Drugs available, Suction available and Patient being monitored Patient Re-evaluated:Patient Re-evaluated prior to induction Oxygen Delivery Method: Circle system utilized Preoxygenation: Pre-oxygenation with 100% oxygen Induction Type: IV induction Ventilation: Mask ventilation without difficulty Laryngoscope Size: Mac and 4 Grade View: Grade I Tube type: Oral Number of attempts: 1 Airway Equipment and Method: Stylet Placement Confirmation: ETT inserted through vocal cords under direct vision,  positive ETCO2 and breath sounds checked- equal and bilateral Secured at: 21 cm Tube secured with: Tape Dental Injury: Teeth and Oropharynx as per pre-operative assessment

## 2019-01-22 NOTE — Anesthesia Preprocedure Evaluation (Signed)

## 2019-01-22 NOTE — Interval H&P Note (Signed)
History and Physical Interval Note:  01/22/2019 10:09 AM  Janice Rodriguez  has presented today for surgery, with the diagnosis of bile leak.  The various methods of treatment have been discussed with the patient and family. After consideration of risks, benefits and other options for treatment, the patient has consented to  Procedure(s): ENDOSCOPIC RETROGRADE CHOLANGIOPANCREATOGRAPHY (ERCP) (N/A) as a surgical intervention.  The patient's history has been reviewed, patient examined, no change in status, stable for surgery.  I have reviewed the patient's chart and labs.  Questions were answered to the patient's satisfaction.     [name]

## 2019-01-22 NOTE — Transfer of Care (Signed)
Immediate Anesthesia Transfer of Care Note  Patient: Janice Rodriguez  Procedure(s) Performed: ENDOSCOPIC RETROGRADE CHOLANGIOPANCREATOGRAPHY (ERCP) (N/A )  Patient Location: Endoscopy Unit  Anesthesia Type:General  Level of Consciousness: awake, patient cooperative and responds to stimulation  Airway & Oxygen Therapy: Patient Spontanous Breathing and Patient connected to face mask oxygen  Post-op Assessment: Report given to RN and Post -op Vital signs reviewed and stable  Post vital signs: Reviewed and stable  Last Vitals:  Vitals Value Taken Time  BP 153/89 01/22/19 1215  Temp    Pulse 88 01/22/19 1220  Resp 14 01/22/19 1220  SpO2 100 % 01/22/19 1220  Vitals shown include unvalidated device data.  Last Pain:  Vitals:   01/22/19 1017  TempSrc: Oral  PainSc: 0-No pain         Complications: No apparent anesthesia complications

## 2019-01-23 ENCOUNTER — Encounter (HOSPITAL_COMMUNITY): Payer: Self-pay | Admitting: Internal Medicine

## 2019-01-25 NOTE — Anesthesia Postprocedure Evaluation (Signed)
Anesthesia Post Note  Patient: Janice Rodriguez  Procedure(s) Performed: ENDOSCOPIC RETROGRADE CHOLANGIOPANCREATOGRAPHY (ERCP) (N/A )     Patient location during evaluation: Endoscopy Anesthesia Type: General Level of consciousness: awake and alert Pain management: pain level controlled Vital Signs Assessment: post-procedure vital signs reviewed and stable Respiratory status: spontaneous breathing, nonlabored ventilation, respiratory function stable and patient connected to nasal cannula oxygen Cardiovascular status: blood pressure returned to baseline and stable Postop Assessment: no apparent nausea or vomiting Anesthetic complications: no    Last Vitals:  Vitals:   01/22/19 1240 01/22/19 1248  BP: (!) 164/92   Pulse: 72 71  Resp: 17 18  Temp:    SpO2: 97% 99%    Last Pain:  Vitals:   01/22/19 1248  TempSrc:   PainSc: 0-No pain                 Jeffrie Lofstrom COKER

## 2019-02-15 MED FILL — HYDROCHLOROTHIAZIDE 12.5 MG: 12.5 | 90 days supply | Qty: 90 | Fill #3

## 2019-02-15 MED FILL — METOPROLOL SUCCINATE ER 50: 50 | 90 days supply | Qty: 135 | Fill #3

## 2019-03-08 MED FILL — POTASSIUM CHLORIDE CRYS ER: 20 | 90 days supply | Qty: 90 | Fill #3

## 2019-03-12 MED FILL — ESOMEPRAZOLE MAG DR 40 MG C: 40 | 30 days supply | Qty: 60 | Fill #2

## 2019-03-13 ENCOUNTER — Other Ambulatory Visit: Payer: Self-pay | Admitting: Interventional Cardiology

## 2019-03-14 MED FILL — CANDESARTAN CILEXETIL 16 MG: 16 | 90 days supply | Qty: 90 | Fill #0

## 2019-03-30 DIAGNOSIS — Z23 Encounter for immunization: Secondary | ICD-10-CM | POA: Diagnosis not present

## 2019-04-02 DIAGNOSIS — Z6836 Body mass index (BMI) 36.0-36.9, adult: Secondary | ICD-10-CM | POA: Diagnosis not present

## 2019-04-02 DIAGNOSIS — Z01419 Encounter for gynecological examination (general) (routine) without abnormal findings: Secondary | ICD-10-CM | POA: Diagnosis not present

## 2019-04-21 DIAGNOSIS — Z1231 Encounter for screening mammogram for malignant neoplasm of breast: Secondary | ICD-10-CM | POA: Diagnosis not present

## 2019-04-21 DIAGNOSIS — Z803 Family history of malignant neoplasm of breast: Secondary | ICD-10-CM | POA: Diagnosis not present

## 2019-05-13 NOTE — Progress Notes (Signed)
Cardiology Office Note:    Date:  05/14/2019   ID:  Janice Rodriguez, DOB 09/26/1955, MRN [id number]  PCP:  [name], DO  Cardiologist:  Sinclair Grooms, MD   Referring MD: [name], DO   Chief Complaint  Patient presents with  . Hyperlipidemia  . Hypertension    History of Present Illness:    Janice Rodriguez is a 63 y.o. female with a hx of hypertension, hyperlipidemia, premature beats, and family history of premature atherosclerosis.  Janice Rodriguez is doing well.  She had a urgent cholecystectomy earlier this year.  There were no cardiac complications.  She has done well since that time.  Since the last visit we were trying to consider whether primary prevention to manage lipids would happen.  She was going to have a coronary CT and then consider statin therapy if abnormal.  She decided not to do either.  She does not want to talk about lipid therapy again.  I have pushed that idea because of her family history (father and mother both had vascular disease including bypass surgery and PAD).  Her father was a smoker.  Past Medical History:  Diagnosis Date  . ASYMPTOMATIC POSTMENOPAUSAL STATUS   . Blood transfusion without reported diagnosis 1980's  . Female cystocele   . Gall stones, common bile duct    dialated bile duct per [place] called today 11-13-2018  . GERD   . History of irregular heartbeat   . HYPERLIPIDEMIA   . HYPERTENSION   . Left atrial enlargement 12/2013   Noted on ECHO  . LVH (left ventricular hypertrophy) 12/2013   Mild, noted on ECHO  . OBESITY   . SCOLIOSIS   . 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 UMBILICAL HERNIORRHAPHY, HX OF    age 7  . Vitamin D deficiency   . Wears glasses     Past Surgical History:  Procedure Laterality Date  . BILIARY STENT PLACEMENT  11/30/2018   Procedure: BILIARY STENT PLACEMENT;  Surgeon: Ladene Artist, MD;  Location:  [facility] ENDOSCOPY;  Service: Endoscopy;;  . CHOLECYSTECTOMY N/A 11/26/2018   Procedure: LAPAROSCOPIC CHOLECYSTECTOMY WITH INTRAOPERATIVE CHOLANGIOGRAM;  Surgeon: Rolm Bookbinder, MD;  Location: [place];  Service: General;  Laterality: N/A;  . COLONOSCOPY  2012   [place]  . COLONOSCOPY  10/2015  . [place]  . ENDOSCOPIC RETROGRADE CHOLANGIOPANCREATOGRAPHY (ERCP) WITH PROPOFOL N/A 11/17/2018   Procedure: ENDOSCOPIC RETROGRADE CHOLANGIOPANCREATOGRAPHY (ERCP) WITH PROPOFOL;  Surgeon: Irene Shipper, MD;  Location: [facility] ENDOSCOPY;  Service: Endoscopy;  Laterality: N/A;  . ENDOSCOPIC RETROGRADE CHOLANGIOPANCREATOGRAPHY (ERCP) WITH PROPOFOL N/A 11/30/2018   Procedure: ENDOSCOPIC RETROGRADE CHOLANGIOPANCREATOGRAPHY (ERCP) WITH PROPOFOL;  Surgeon: Ladene Artist, MD;  Location: [facility] ENDOSCOPY;  Service: Endoscopy;  Laterality: N/A;  . ERCP N/A 01/22/2019   Procedure: ENDOSCOPIC RETROGRADE CHOLANGIOPANCREATOGRAPHY (ERCP);  Surgeon: Irene Shipper, MD;  Location: [facility] ENDOSCOPY;  Service: Endoscopy;  Laterality: N/A;  . REMOVAL OF STONES  11/17/2018   Procedure: REMOVAL OF STONES;  Surgeon: Irene Shipper, MD;  Location: [facility] ENDOSCOPY;  Service: Endoscopy;;  . REMOVAL OF STONES  11/30/2018   Procedure: REMOVAL OF STONES;  Surgeon: Ladene Artist, MD;  Location: [facility] ENDOSCOPY;  Service: Endoscopy;;  . [name]  11/17/2018   Procedure: SPHINCTEROTOMY;  Surgeon: Irene Shipper, MD;  Location: [facility] ENDOSCOPY;  Service: Endoscopy;;  . [place]  . UPPER GASTROINTESTINAL  ENDOSCOPY  10/2015  . VAGINAL HYSTERECTOMY  1992    Current Medications: Current Meds  Medication Sig  . acetaminophen (TYLENOL) 500 MG tablet Take 1,000 mg by mouth every 6 (six) hours as needed (pain).  Marland Kitchen aspirin EC 81 MG tablet Take 81 mg by mouth daily.  . candesartan (ATACAND) 16 MG tablet Take 1 tablet (16 mg total) by mouth daily. Please keep upcoming appt for future refills. Thank you  . cholecalciferol  (VITAMIN D3) 25 MCG (1000 UT) tablet Take 1,000 Units by mouth daily.  Marland Kitchen esomeprazole (NEXIUM) 40 MG capsule Take 40 mg by mouth daily at 12 noon.  . hydrochlorothiazide (MICROZIDE) 12.5 MG capsule Take 1 capsule (12.5 mg total) by mouth every morning.  . metoprolol succinate (TOPROL-XL) 50 MG 24 hr tablet Take 1.5 tablets (75 mg) by mouth daily with or immediately following a meal.  . potassium chloride SA (K-DUR,KLOR-CON) 20 MEQ tablet Take 1 tablet (20 mEq total) by mouth daily.  . Probiotic Product (PROBIOTIC DAILY PO) Take 1 capsule by mouth daily as needed (upset stomach).   . vitamin C (ASCORBIC ACID) 500 MG tablet Take 500 mg by mouth daily.     Allergies:   Patient has no known allergies.   Social History   Socioeconomic History  . Marital status: Married    Spouse name: Purcell Nails  . Number of children: Not on file  . Years of education: Not on file  . Highest education level: Not on file  Occupational History  . Occupation: Therapist, sports- Engineer, drilling: [employer]  . Financial resource strain: Not on file  . Food insecurity    Worry: Not on file    Inability: Not on file  . Transportation needs    Medical: Not on file    Non-medical: Not on file  Tobacco Use  . Smoking status: Never Smoker  . Smokeless tobacco: Never Used  Substance and Sexual Activity  . Alcohol use: Yes    Alcohol/week: 0.0 standard drinks    Comment: occas wine  . Drug use: No  . Sexual activity: Not on file  Lifestyle  . Physical activity    Days per week: Not on file    Minutes per session: Not on file  . Stress: Not on file  Relationships  . Social Herbalist on phone: Not on file    Gets together: Not on file    Attends religious service: Not on file    Active member of club or organization: Not on file    Attends meetings of clubs or organizations: Not on file    Relationship status: Not on file  Other Topics Concern  . Not on file   Social History Narrative   Married, lives with spouse and occ kid when back at home. 3 grown kids. 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 There is no history of Esophageal cancer, Pancreatic cancer, Rectal cancer, Stomach cancer, or Colon polyps.  ROS:   Please see the history of present illness.  Under stress.  Her husband has chronic pain syndrome and other medical problems.  He is losing weight.  He is chronically ill.  All other systems reviewed and are negative.  EKGs/Labs/Other Studies Reviewed:    The following studies were reviewed today: No new imaging  EKG:  EKG performed on Nov 26, 2018 demonstrated normal sinus rhythm with biphasic T wave abnormality in the precordium.  No acute ST-T wave changes noted.  Recent Labs: 12/01/2018: ALT 16 12/02/2018: BUN 8; Creatinine, Ser 0.98; Potassium 3.3; Sodium 139 12/03/2018: Hemoglobin 8.5; Platelets 327  Recent Lipid Panel    Component Value Date/Time   CHOL 195 11/21/2015 1021   TRIG 142.0 11/21/2015 1021   HDL 33.60 (L) 11/21/2015 1021   CHOLHDL 6 11/21/2015 1021   VLDL 28.4 11/21/2015 1021   LDLCALC 133 (H) 11/21/2015 1021   LDLDIRECT 146.8 07/13/2013 0859    Physical Exam:    VS:  BP (!) 154/78   Pulse 61   Ht 5\' 6"  (1.676 m)   Wt 196 lb (88.9 kg)   SpO2 99%   BMI 31.64 kg/m     Wt Readings from Last 3 Encounters:  05/14/19 196 lb (88.9 kg)  01/22/19 190 lb (86.2 kg)  12/28/18 190 lb (86.2 kg)     GEN: Moderate obesity. No acute distress HEENT: Normal NECK: No JVD. LYMPHATICS: No  lymphadenopathy CARDIAC:  RRR without murmur, gallop, or edema. VASCULAR:  Normal Pulses. No bruits. RESPIRATORY:  Clear to auscultation without rales, wheezing or rhonchi.  Thorax demonstrates kyphoscoliosis. ABDOMEN: Soft, non-tender, non-distended, No pulsatile mass, MUSCULOSKELETAL: No deformity  SKIN: Warm and dry NEUROLOGIC:  Alert and oriented x 3 PSYCHIATRIC:  Normal affect   ASSESSMENT:    1. Essential hypertension   2. Premature ventricular contractions   3. Other hyperlipidemia   4. Morbid obesity ([place])   5. Educated about COVID-19 virus infection    PLAN:    In order of problems listed above:  1. Elevated systolic blood pressure today.  I have asked her to be mindful of salt restriction and to avoid nonsteroidal anti-inflammatory therapy.  She measures her blood pressure at home and states that they are consistently under 135/80 mmHg.  She will continue to monitor. 2. No complaints 3. She does not want to treat her lipids 4. We discussed aerobic activity, weight loss, decrease carbohydrate intake, and reduced animal fats in diet. 5. The 3W's are being observed to avoid Covid infection.  Overall education and awareness concerning primary/secondary risk prevention was discussed in detail: LDL less than 70, hemoglobin A1c less than 7, blood pressure target less than 130/80 mmHg, >150 minutes of moderate aerobic activity per week, avoidance of smoking, weight control (via diet and exercise), and continued surveillance/management of/for obstructive sleep apnea.     Medication Adjustments/Labs and Tests Ordered: Current medicines are reviewed at length with the patient today.  Concerns regarding medicines are outlined above.  No orders of the defined types were placed in this encounter.  No orders of the defined types were placed in this encounter.   Patient Instructions  Medication Instructions:  Your physician recommends that you continue on your current medications  as directed. Please refer to the Current Medication list given to you today.  *If you need a refill on your cardiac medications before your next appointment, please call your pharmacy*  Lab Work: None If you have labs (blood work) drawn today and your tests are completely normal, you will receive your results only  by: . MyChart Message (if you have MyChart) OR . A paper copy in the mail If you have any lab test that is abnormal or we need to change your treatment, we will call you to review the results.  Testing/Procedures: None  Follow-Up: At [facility], you and your health needs are our priority.  As part of our continuing mission to provide you with exceptional heart care, we have created designated Provider Care Teams.  These Care Teams include your primary Cardiologist (physician) and Advanced Practice Providers (APPs -  Physician Assistants and Nurse Practitioners) who all work together to provide you with the care you need, when you need it.  Your next appointment:   12 months  The format for your next appointment:   In Person  Provider:   You may see Sinclair Grooms, MD or one of the following Advanced Practice Providers on your designated Care Team:    Truitt Merle, NP  Cecilie Kicks, NP  Kathyrn Drown, NP   Other Instructions      Signed, Sinclair Grooms, MD  05/14/2019 5:44 PM    [place]

## 2019-05-14 ENCOUNTER — Encounter: Payer: Self-pay | Admitting: Interventional Cardiology

## 2019-05-14 ENCOUNTER — Other Ambulatory Visit: Payer: Self-pay

## 2019-05-14 ENCOUNTER — Ambulatory Visit: Payer: 59 | Admitting: Interventional Cardiology

## 2019-05-14 VITALS — BP 154/78 | HR 61 | Ht 66.0 in | Wt 196.0 lb

## 2019-05-14 DIAGNOSIS — I10 Essential (primary) hypertension: Secondary | ICD-10-CM | POA: Diagnosis not present

## 2019-05-14 DIAGNOSIS — Z7189 Other specified counseling: Secondary | ICD-10-CM | POA: Diagnosis not present

## 2019-05-14 DIAGNOSIS — I493 Ventricular premature depolarization: Secondary | ICD-10-CM | POA: Diagnosis not present

## 2019-05-14 DIAGNOSIS — E7849 Other hyperlipidemia: Secondary | ICD-10-CM | POA: Diagnosis not present

## 2019-05-14 NOTE — Patient Instructions (Signed)

## 2019-05-15 ENCOUNTER — Other Ambulatory Visit: Payer: Self-pay | Admitting: Interventional Cardiology

## 2019-05-15 MED FILL — ESOMEPRAZOLE MAG DR 40 MG C: 40 | 30 days supply | Qty: 60 | Fill #3

## 2019-05-16 ENCOUNTER — Other Ambulatory Visit: Payer: Self-pay | Admitting: Interventional Cardiology

## 2019-05-16 MED ORDER — HYDROCHLOROTHIAZIDE 12.5 MG PO CAPS: 12.5000 mg | ORAL_CAPSULE | Freq: Every morning | ORAL | 3 refills | Status: DC

## 2019-05-16 MED ORDER — METOPROLOL SUCCINATE ER 50 MG PO TB24: ORAL_TABLET | ORAL | 3 refills | Status: DC

## 2019-05-16 MED FILL — METOPROLOL SUCCINATE ER 50: 50 | 90 days supply | Qty: 135 | Fill #0

## 2019-05-16 MED FILL — HYDROCHLOROTHIAZIDE 12.5 MG: 12.5 | 90 days supply | Qty: 90 | Fill #0

## 2019-06-06 ENCOUNTER — Other Ambulatory Visit: Payer: Self-pay | Admitting: Interventional Cardiology

## 2019-06-06 MED FILL — CANDESARTAN CILEXETIL 16 MG: 16 | 90 days supply | Qty: 90 | Fill #0

## 2019-06-06 MED FILL — POTASSIUM CHLORIDE CRYS ER: 20 | 90 days supply | Qty: 90 | Fill #0

## 2019-07-11 MED FILL — ESOMEPRAZOLE MAG DR 40 MG C: 40 | 30 days supply | Qty: 60 | Fill #4

## 2019-08-12 MED FILL — HYDROCHLOROTHIAZIDE 12.5 MG: 12.5 | 90 days supply | Qty: 90 | Fill #1

## 2019-08-13 MED FILL — METOPROLOL SUCCINATE ER 50: 50 | 90 days supply | Qty: 135 | Fill #1

## 2019-08-17 ENCOUNTER — Ambulatory Visit: Payer: 59

## 2019-09-02 MED FILL — POTASSIUM CHLORIDE CRYS ER: 20 | 90 days supply | Qty: 90 | Fill #1

## 2019-09-04 MED FILL — ESOMEPRAZOLE MAG DR 40 MG C: 40 | 30 days supply | Qty: 60 | Fill #5

## 2019-09-05 ENCOUNTER — Other Ambulatory Visit: Payer: Self-pay | Admitting: Interventional Cardiology

## 2019-09-05 MED FILL — CANDESARTAN CILEXETIL 16 MG: 16 | 90 days supply | Qty: 90 | Fill #0

## 2019-09-25 IMAGING — CT CT ABDOMEN AND PELVIS WITH CONTRAST
2 of 5 series · 15 of 46 positions shown, 17 images · IV contrast (APPLIED)
Comparison: Ultrasound November 13, 2018

CLINICAL DATA: Right upper quadrant pain. ERCP November 17, 2018. Nausea
for several days. Fever last night. Vomiting this morning.

EXAM:
CT ABDOMEN AND PELVIS WITH CONTRAST
TECHNIQUE: Multidetector CT imaging of the abdomen and pelvis was performed
using the standard protocol following bolus administration of
intravenous contrast.
CONTRAST:  80mL OMNIPAQUE IOHEXOL 300 MG/ML  SOLN

[Series 3: abdomen 5.0 · axial · 0.90mm/px · z∈[+800,+1145]mm · 12 of 83 slices shown, 14 images]
[im 7/83  soft-tissue]
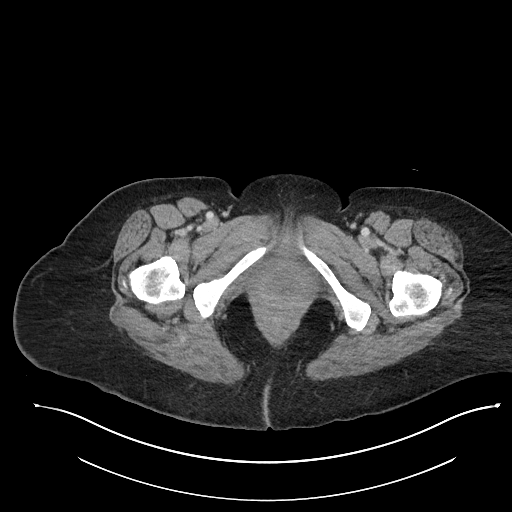
[im 7/83  bone]
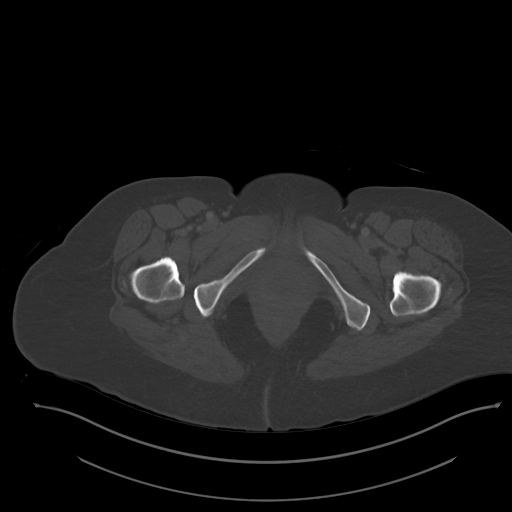
[im 13/83  soft-tissue]
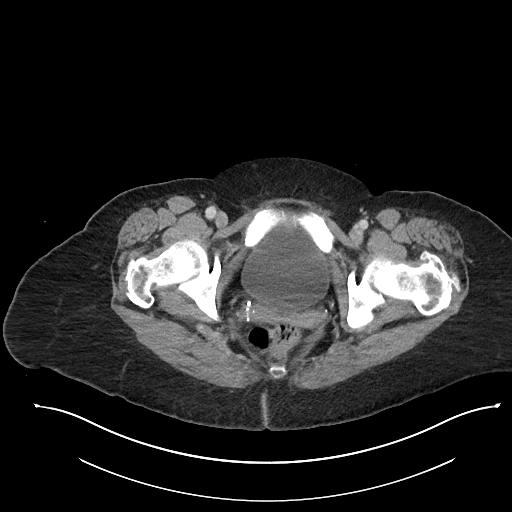
[im 19/83  soft-tissue]
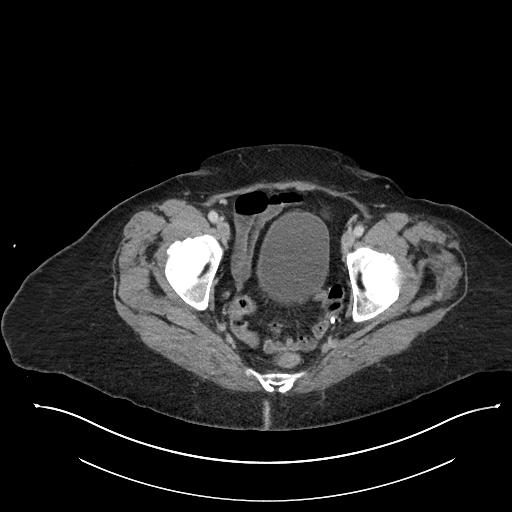
[im 26/83  soft-tissue]
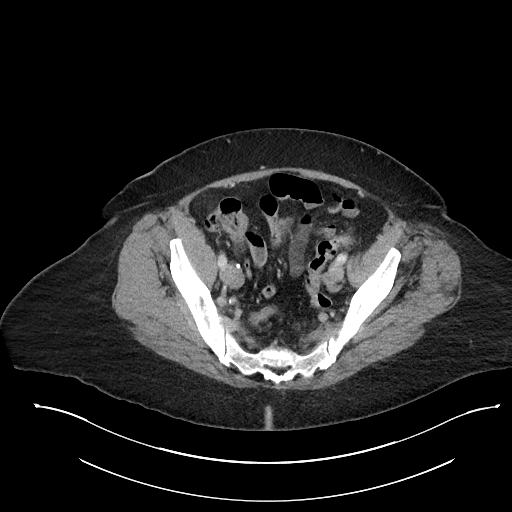
[im 32/83  soft-tissue]
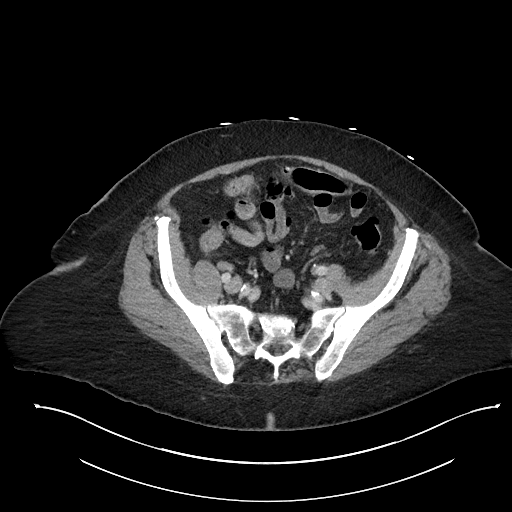
[im 38/83  soft-tissue]
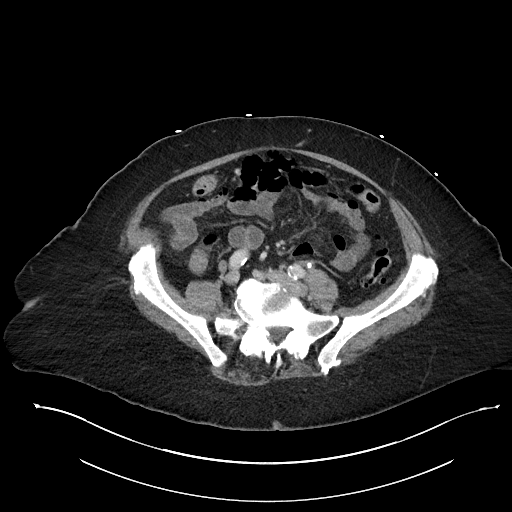
[im 45/83  soft-tissue]
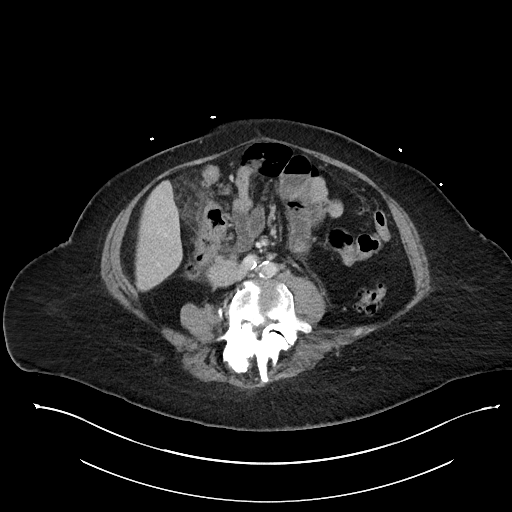
[im 51/83  soft-tissue]
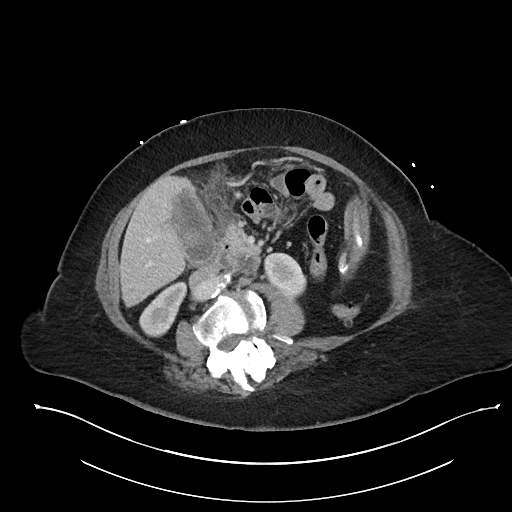
[im 57/83  soft-tissue]
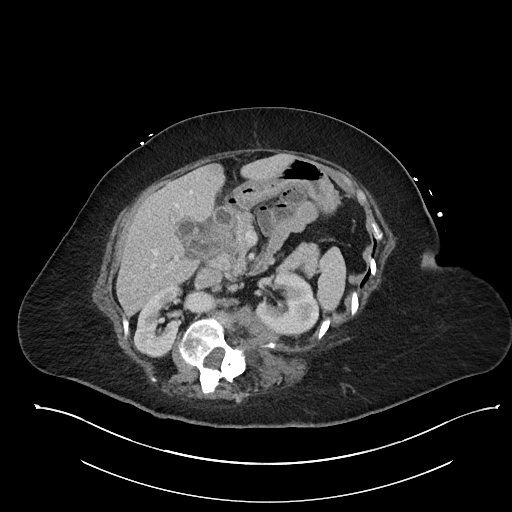
[im 57/83  bone]
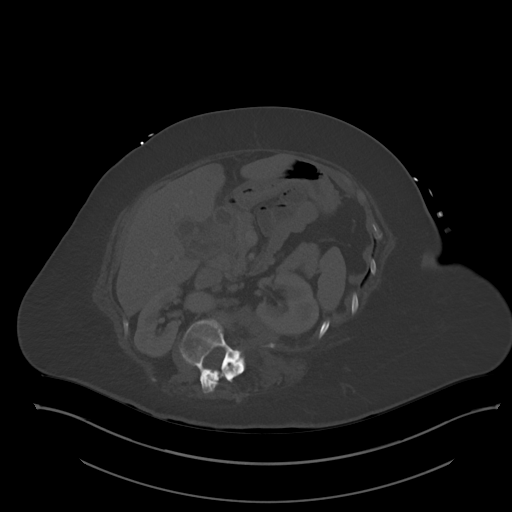
[im 64/83  soft-tissue]
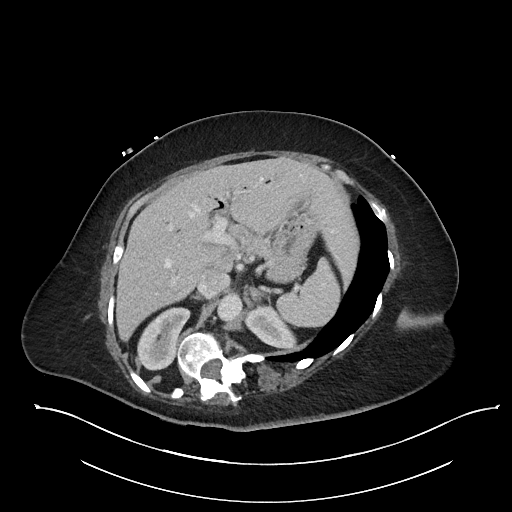
[im 70/83  soft-tissue]
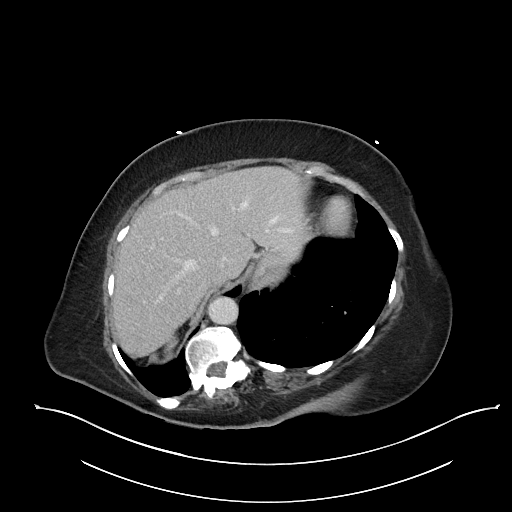
[im 76/83  soft-tissue]
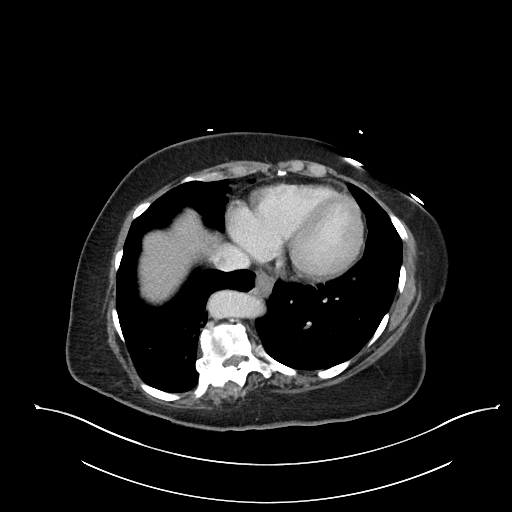

[Series 6: abdomen 3.0 mpr cor · coronal · 0.80mm/px · 3 of 90 slices shown]
[im 30/90  soft-tissue]
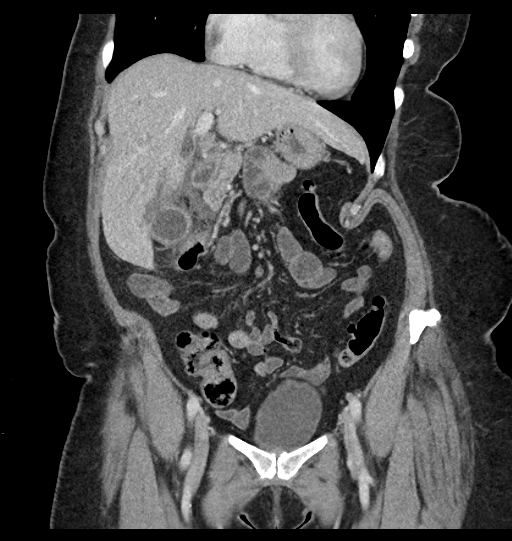
[im 40/90  soft-tissue]
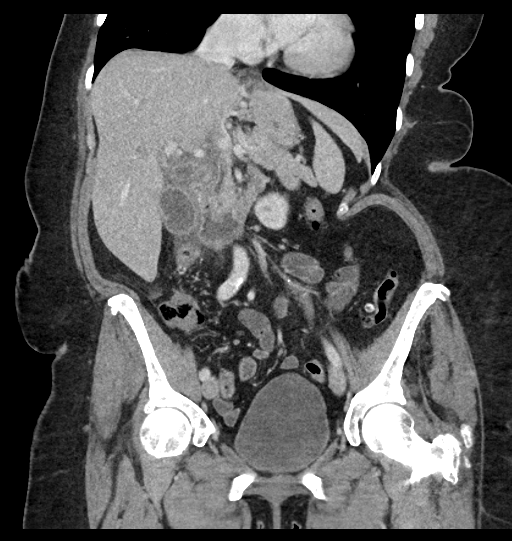
[im 50/90  soft-tissue]
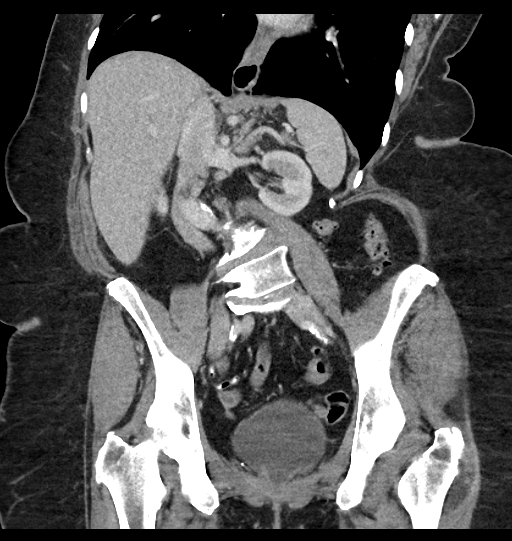

[15 of 46 positions shown; findings below may reference images not displayed]

FINDINGS: Lower chest: There is a small hiatal hernia. The lung bases and
lower chest are otherwise normal.

Hepatobiliary: No liver masses are identified. The gallbladder is
somewhat thick walled. There is fat stranding adjacent to the
gallbladder. There is also a small amount of fluid in the right
upper quadrant, extending inferior to the gallbladder. There is air
in the bile ducts consistent with recent ERCP and sphincterotomy.
Intra and extrahepatic biliary duct prominence remains. Portal vein
is patent.

Pancreas: The fat stranding in fluid in the right upper quadrant
abuts the pancreatic head. However, the pancreas itself is grossly
unremarkable.

Spleen: Normal in size without focal abnormality.

Adrenals/Urinary Tract: Adrenal glands are normal. The kidneys are
unremarkable. No hydronephrosis or perinephric stranding. The
ureters are normal. The bladder is unremarkable.

Stomach/Bowel: There is a small hiatal hernia. The stomach is
otherwise normal. The duodenum is unremarkable. The remainder of the
small bowel is normal. Colonic diverticulosis is seen without
diverticulitis. Visualized appendix is normal.

Vascular/Lymphatic: Atherosclerotic changes are seen in the tortuous
nonaneurysmal aorta. No adenopathy.

Reproductive: Status post hysterectomy. No adnexal masses.

Other: No abdominal wall hernia or abnormality. No abdominopelvic
ascites.

Musculoskeletal: Scoliotic curvature lumbar spine, apex to the
right. Degenerative changes in the thoracolumbar spine including the
facets.
IMPRESSION: 1. There is mild gallbladder wall thickening. There is fat stranding
and fluid in the right upper quadrant abutting the duodenum,
gallbladder, and pancreatic head. Findings are not specific but
concerning for acute cholecystitis or cholangitis. The patient has a
normal lipase today making pancreatitis unlikely. The duodenum
demonstrates no definitive wall thickening making duodenitis less
likely.

## 2019-09-30 IMAGING — RF ERCP
1 series · 12 of 12 positions shown · non-contrast
Comparison: CT abdomen and pelvis - 11/25/2018

CLINICAL DATA: Post cholecystectomy, now with concern for bile
leak.

EXAM:
ERCP
TECHNIQUE: Multiple spot images obtained with the fluoroscopic device and
submitted for interpretation post-procedure.
FLUOROSCOPY TIME:  3 minutes, 33 seconds

[Series 1: unknown protocol · 0.20mm/px · 12 of 12 slices shown]
[im 1/12]
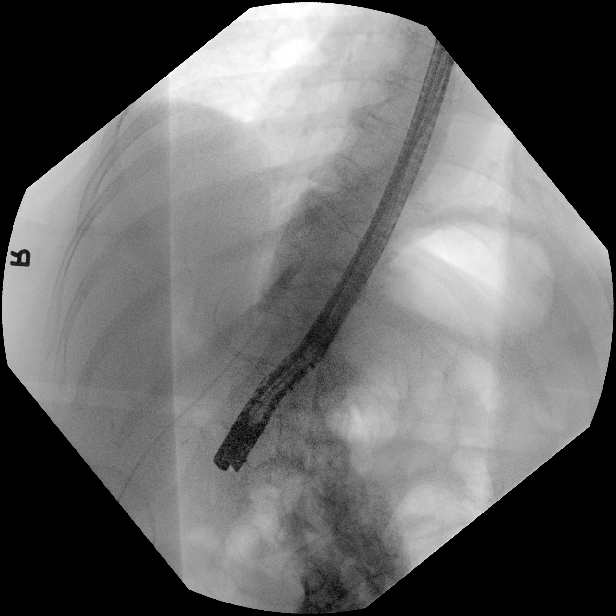
[im 2/12]
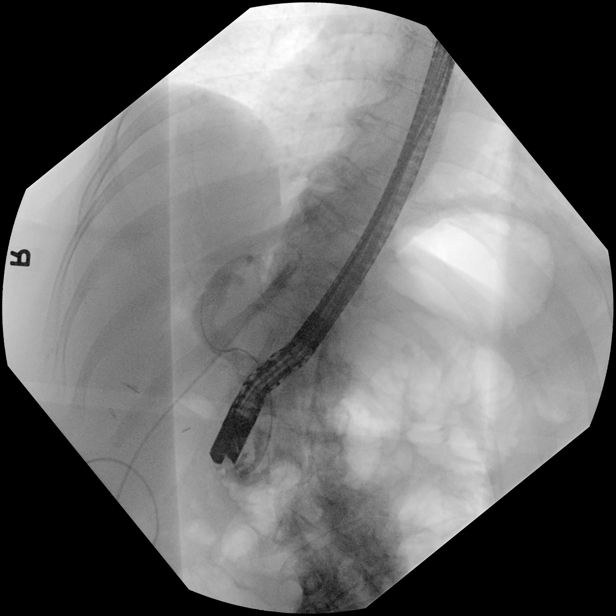
[im 3/12]
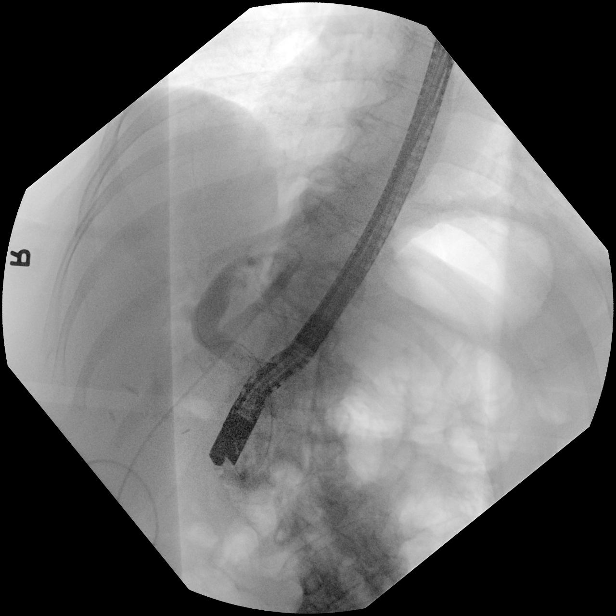
[im 4/12]
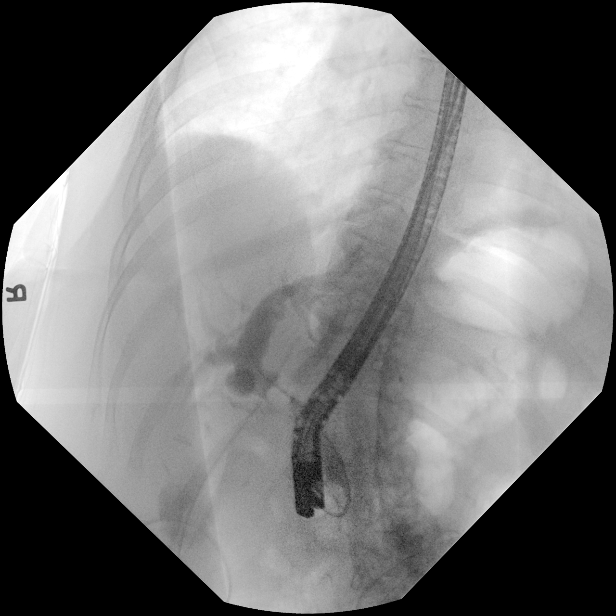
[im 5/12]
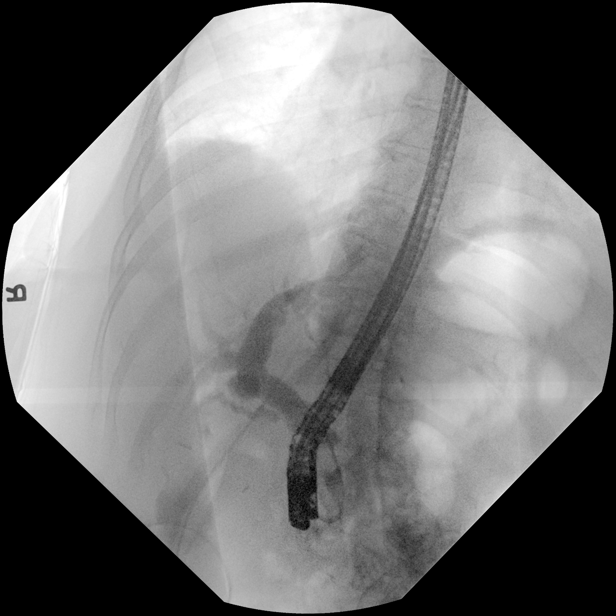
[im 6/12]
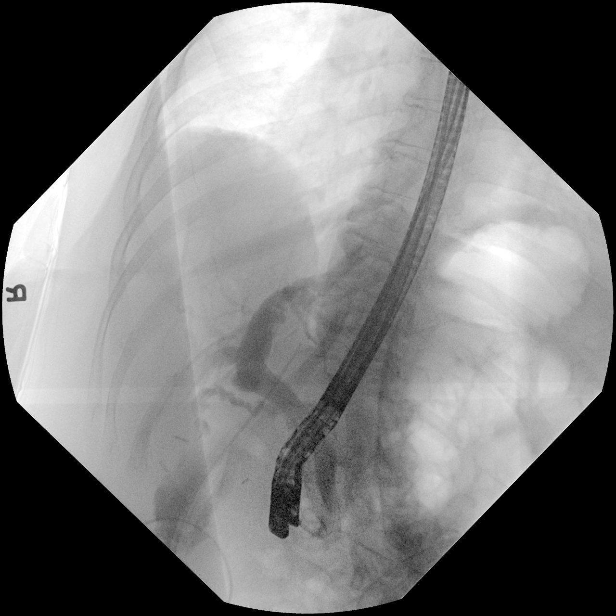
[im 7/12]
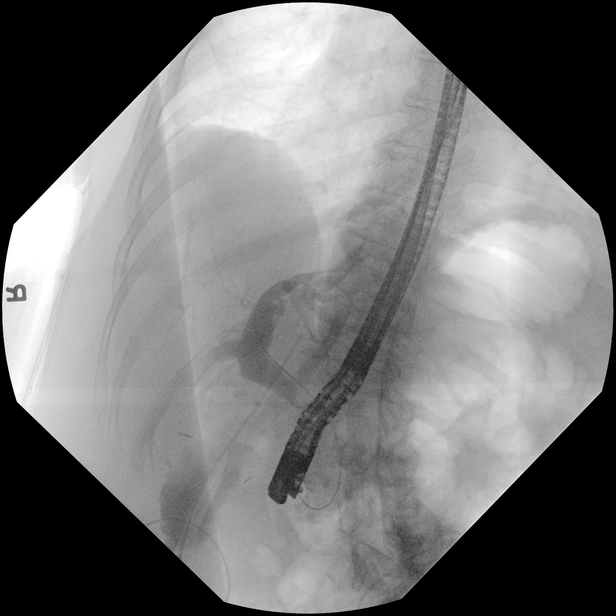
[im 8/12]
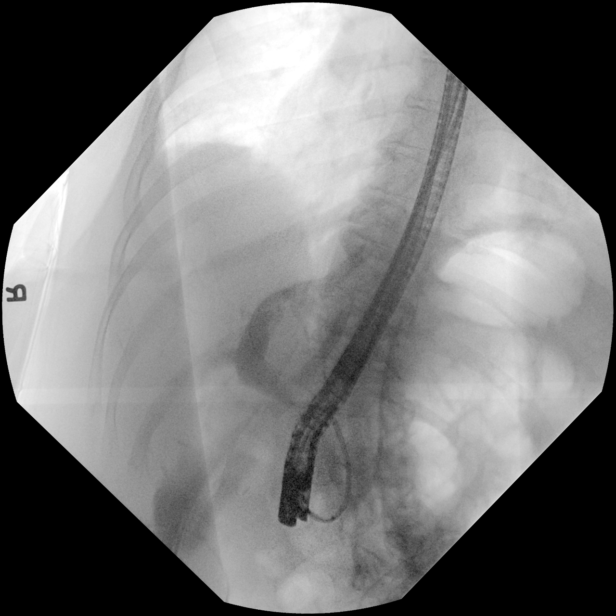
[im 9/12]
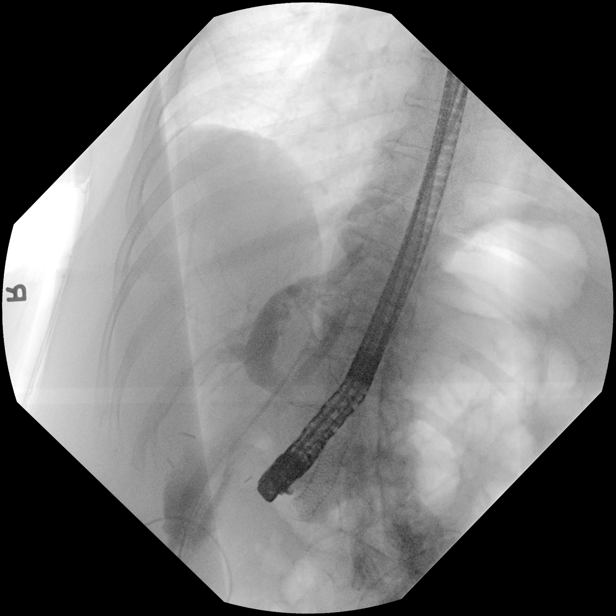
[im 10/12]
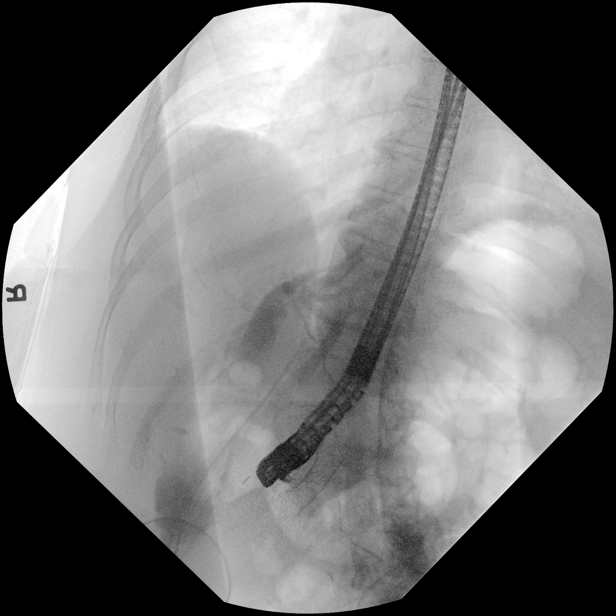
[im 11/12]
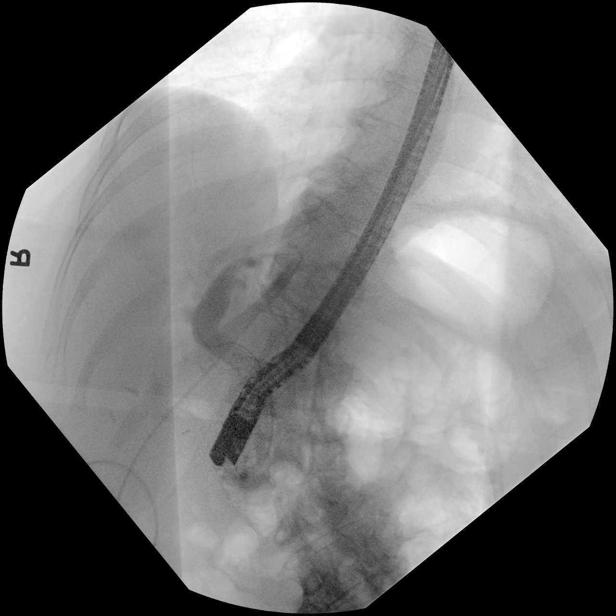
[im 12/12]
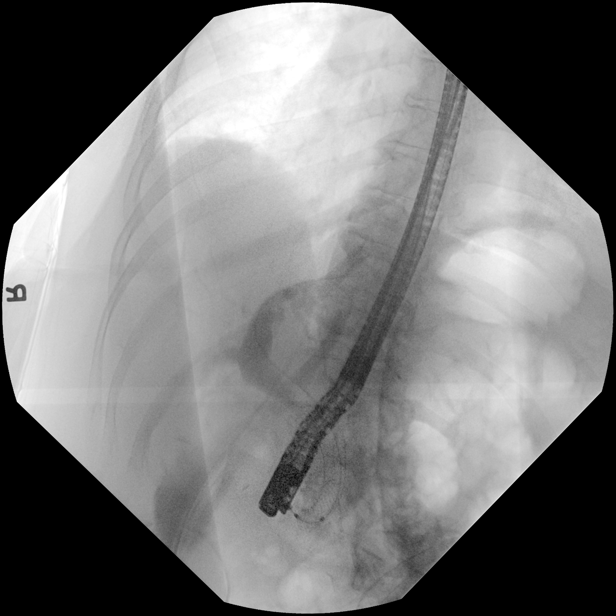

[12 of 12 positions shown; findings below may reference images not displayed]

FINDINGS: Twelve spot intraoperative fluoroscopic images of the right upper
abdominal quadrant during ERCP are provided for review.

Initial image demonstrates an ERCP probe overlying the right upper
abdominal quadrant. Cholecystectomy clips overlie the gallbladder
fossa. Surgical drainage catheter overlies the right upper abdominal
quadrant.

Subsequent images demonstrate selective cannulation and
opacification of the common bile duct which appears moderately
dilated.

Subsequent images demonstrate insufflation of a balloon within
mid/distal aspect of the CBD. There is minimal opacification of the
residual cystic duct with apparent extravasation of contrast from
the residual cystic duct remnant.

Completion images demonstrate placement of a internal metallic
biliary stent within distal aspect of the CBD.
IMPRESSION: ERCP with findings worrisome for bile leak from the cystic duct
remnant with subsequent placement of an internal metallic biliary
stent.

These images were submitted for radiologic interpretation only.
Please see the procedural report for the amount of contrast and the
fluoroscopy time utilized.

## 2019-11-09 ENCOUNTER — Other Ambulatory Visit: Payer: Self-pay | Admitting: Internal Medicine

## 2019-11-15 MED FILL — METOPROLOL SUCCINATE ER 50: 50 | 90 days supply | Qty: 135 | Fill #2

## 2019-11-22 IMAGING — RF ERCP
1 series · 14 of 24 positions shown · non-contrast
Comparison: CT November 30, 2018

CLINICAL DATA: 63-year-old female with a history of bile leak after
cholecystectomy

EXAM:
ERCP
TECHNIQUE: Multiple spot images obtained with the fluoroscopic device and
submitted for interpretation post-procedure.
FLUOROSCOPY TIME:  Fluoroscopy Time:  4 minutes 11 seconds

[Series 1: run · 14 of 25 slices shown]
[im 1/25]
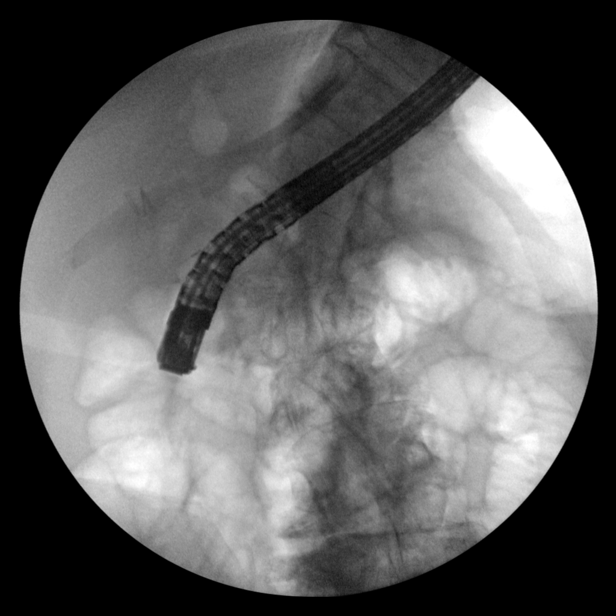
[im 3/25]
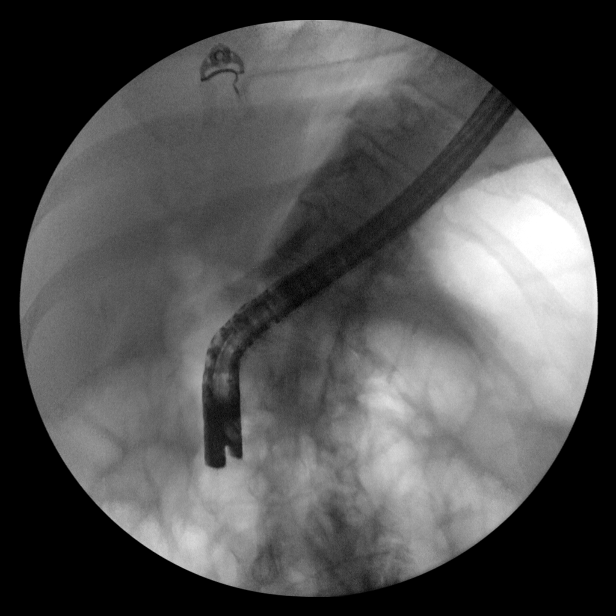
[im 5/25]
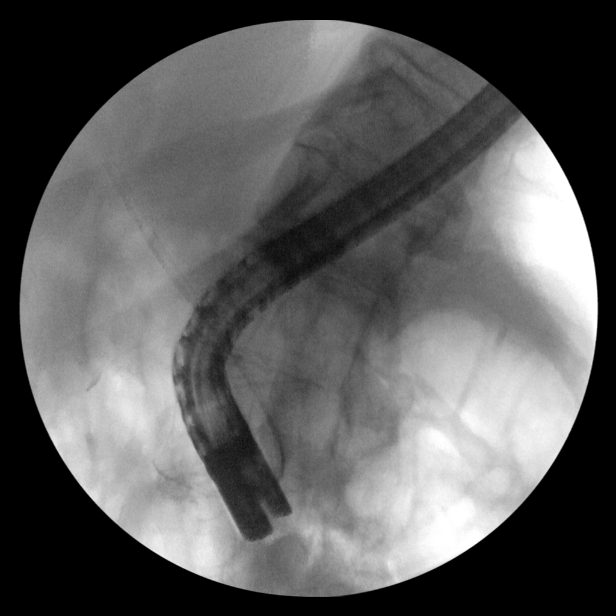
[im 7/25]
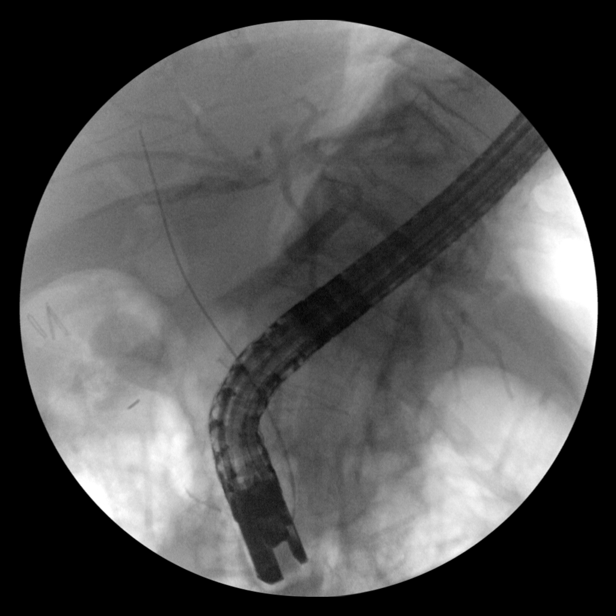
[im 8/25]
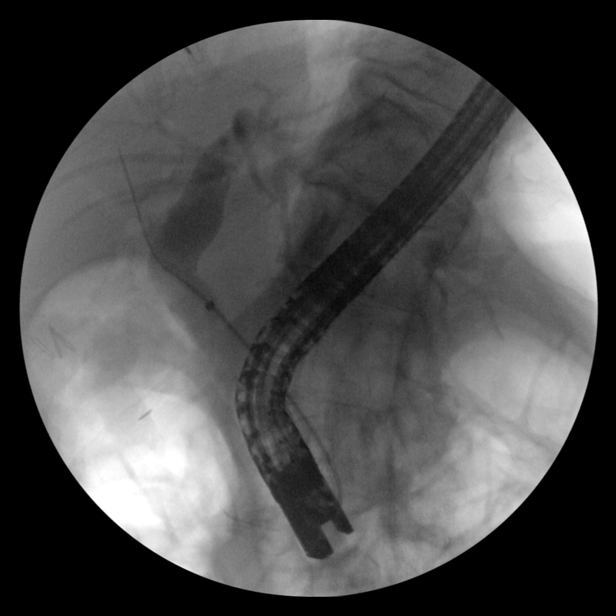
[im 10/25]
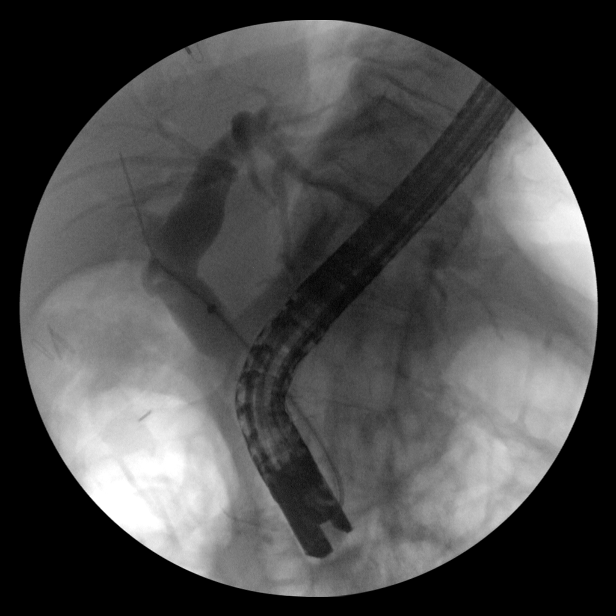
[im 12/25]
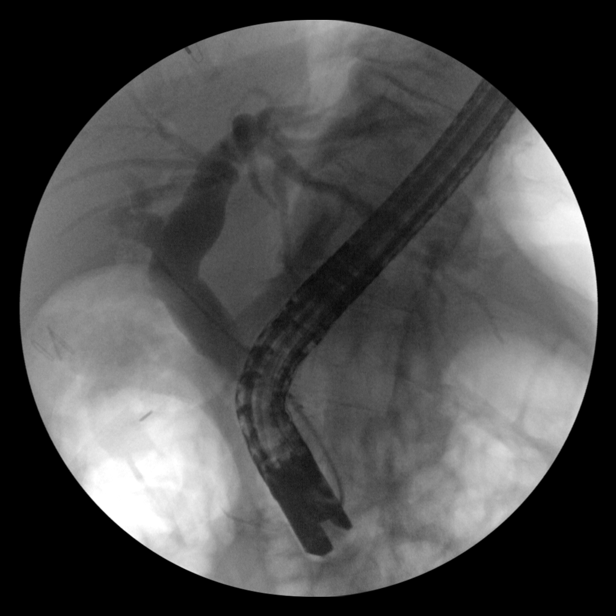
[im 13/25]
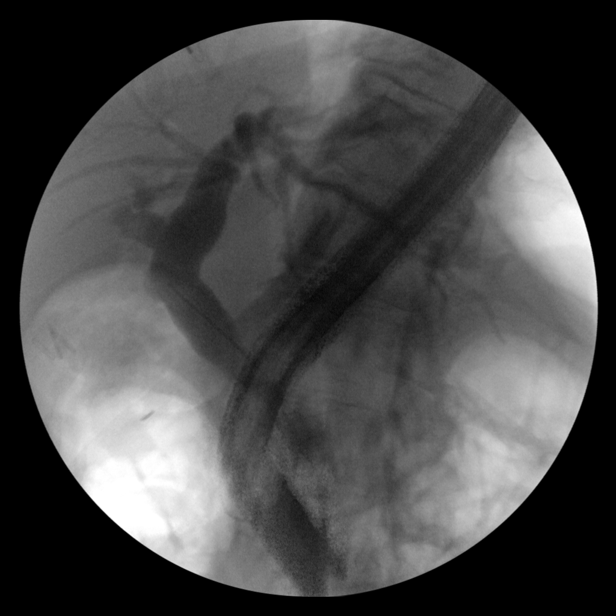
[im 15/25]
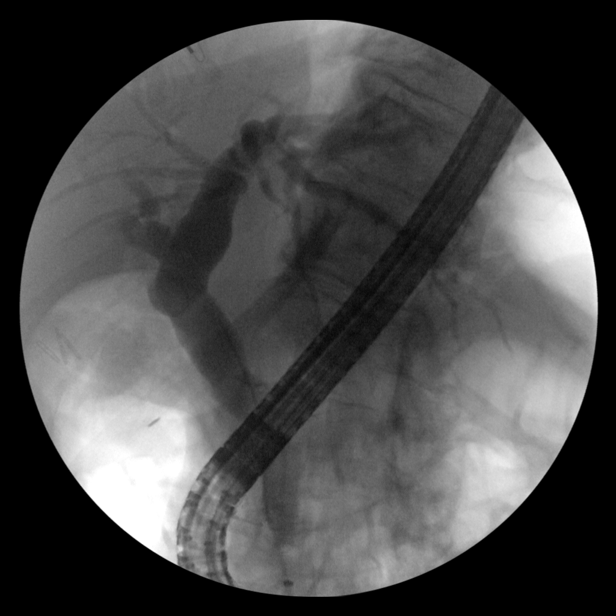
[im 17/25]
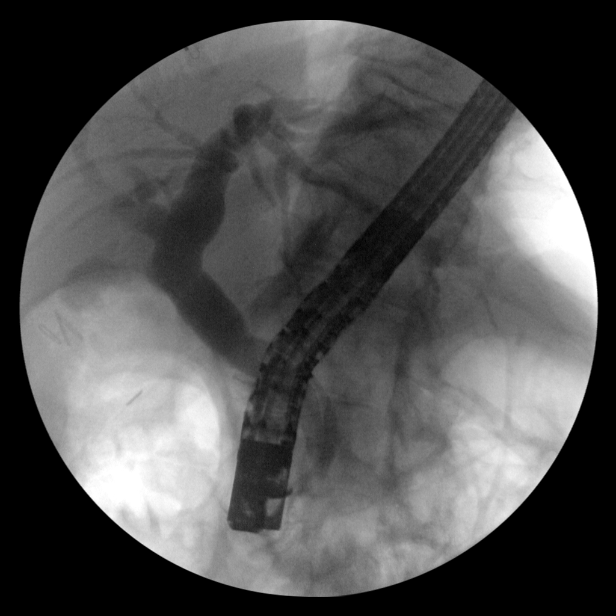
[im 19/25]
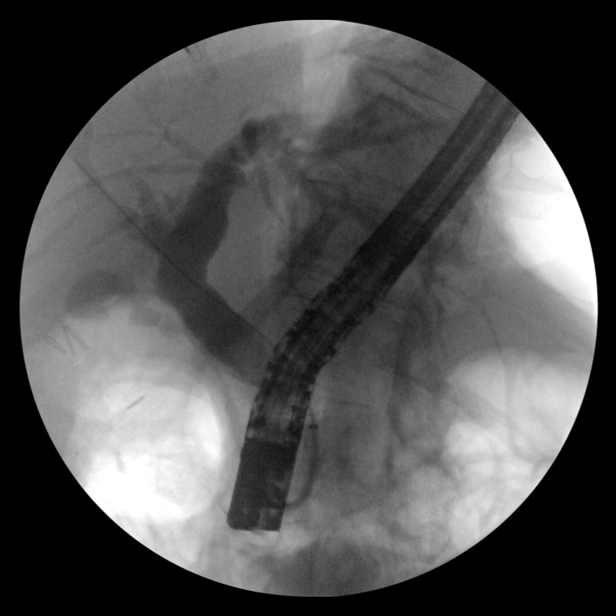
[im 20/25]
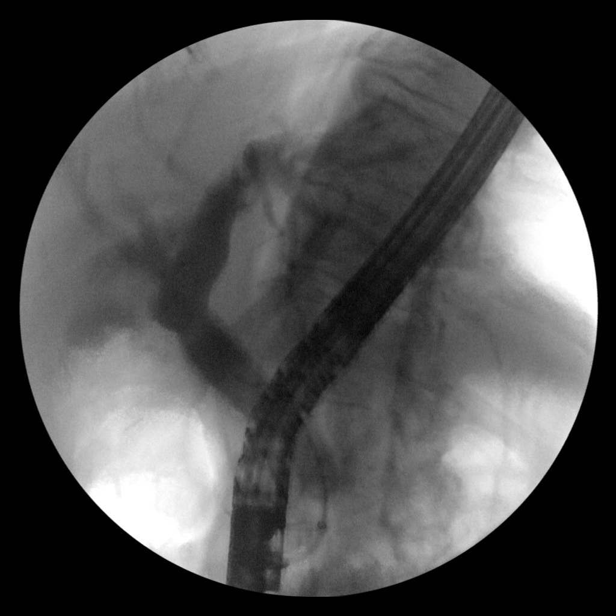
[im 22/25]
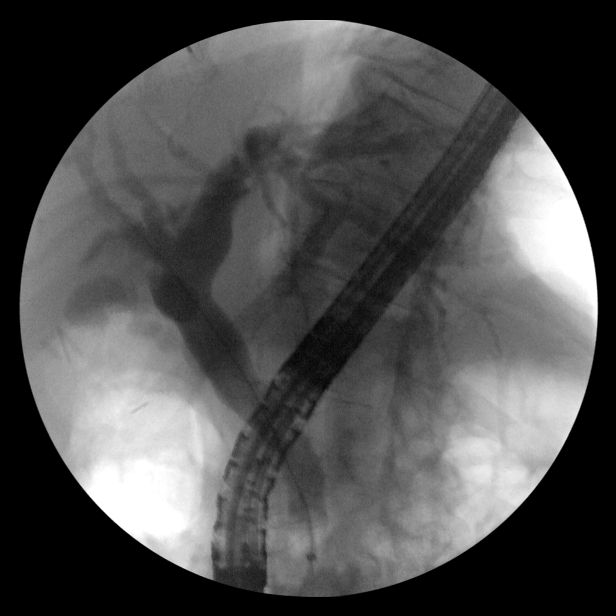
[im 25/25]
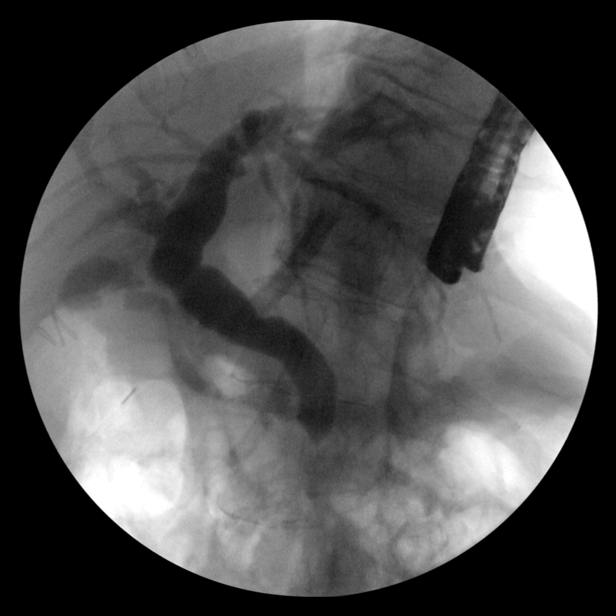

[14 of 24 positions shown; findings below may reference images not displayed]

FINDINGS: Intraoperative fluoroscopic spot images of ERCP.

Initial image demonstrates endoscope projecting over the upper
abdomen, with changes of cholecystectomy. There is then cannulation
of the ampulla and retrograde infusion of contrast.

Deployment of a retrieval balloon.

Final image demonstrates retained contrast within the extrahepatic
biliary system with borderline dilated diameter.

Partial opacification of the extrahepatic biliary ducts which are
borderline dilated, and deployment of a retrieval balloon.

Image 24 demonstrates vague linear contrast to the right of the
common bile duct. Uncertain location.
IMPRESSION: Limited images of ERCP demonstrating partial opacification of the
extrahepatic biliary ducts and borderline dilated ducts, with
deployment of retrieval balloon.

Final images demonstrate vague linear contrast accumulating to the
right of the common bile duct in the region of the surgical clips.
Uncertain if this represents extraluminal contrast or a contrast
column within the remnant cystic duct.

Please refer to the dictated operative report for full details of
intraoperative findings and procedure.

## 2019-11-30 MED FILL — CANDESARTAN CILEXETIL 16 MG: 16 | 90 days supply | Qty: 90 | Fill #1

## 2019-11-30 MED FILL — POTASSIUM CHLORIDE CRYS ER: 20 | 90 days supply | Qty: 90 | Fill #2

## 2019-12-11 DIAGNOSIS — E559 Vitamin D deficiency, unspecified: Secondary | ICD-10-CM | POA: Diagnosis not present

## 2019-12-17 DIAGNOSIS — R7309 Other abnormal glucose: Secondary | ICD-10-CM | POA: Diagnosis not present

## 2019-12-17 DIAGNOSIS — M419 Scoliosis, unspecified: Secondary | ICD-10-CM | POA: Diagnosis not present

## 2019-12-17 DIAGNOSIS — I10 Essential (primary) hypertension: Secondary | ICD-10-CM | POA: Diagnosis not present

## 2019-12-17 DIAGNOSIS — E669 Obesity, unspecified: Secondary | ICD-10-CM | POA: Diagnosis not present

## 2019-12-17 DIAGNOSIS — E559 Vitamin D deficiency, unspecified: Secondary | ICD-10-CM | POA: Diagnosis not present

## 2019-12-17 DIAGNOSIS — Z0184 Encounter for antibody response examination: Secondary | ICD-10-CM | POA: Diagnosis not present

## 2019-12-17 DIAGNOSIS — R946 Abnormal results of thyroid function studies: Secondary | ICD-10-CM | POA: Diagnosis not present

## 2019-12-17 DIAGNOSIS — Z0000 Encounter for general adult medical examination without abnormal findings: Secondary | ICD-10-CM | POA: Diagnosis not present

## 2019-12-17 DIAGNOSIS — I493 Ventricular premature depolarization: Secondary | ICD-10-CM | POA: Diagnosis not present

## 2019-12-18 DIAGNOSIS — Z0184 Encounter for antibody response examination: Secondary | ICD-10-CM | POA: Diagnosis not present

## 2019-12-18 DIAGNOSIS — R7309 Other abnormal glucose: Secondary | ICD-10-CM | POA: Diagnosis not present

## 2019-12-18 DIAGNOSIS — R946 Abnormal results of thyroid function studies: Secondary | ICD-10-CM | POA: Diagnosis not present

## 2019-12-18 DIAGNOSIS — Z0000 Encounter for general adult medical examination without abnormal findings: Secondary | ICD-10-CM | POA: Diagnosis not present

## 2020-01-08 MED FILL — ESOMEPRAZOLE MAG DR 40 MG C: 40 | 30 days supply | Qty: 60 | Fill #1

## 2020-01-17 MED FILL — AMOXICILLIN 500 MG CAPSULE: 500 | 7 days supply | Qty: 21 | Fill #0

## 2020-02-08 MED FILL — HYDROCHLOROTHIAZIDE 12.5 MG: 12.5 | 90 days supply | Qty: 90 | Fill #3

## 2020-02-08 MED FILL — METOPROLOL SUCCINATE ER 50: 50 | 90 days supply | Qty: 135 | Fill #3

## 2020-02-23 MED FILL — POTASSIUM CHLORIDE CRYS ER: 20 | 90 days supply | Qty: 90 | Fill #3

## 2020-03-05 MED FILL — ESOMEPRAZOLE MAG DR 40 MG C: 40 | 30 days supply | Qty: 60 | Fill #2

## 2020-03-05 MED FILL — CANDESARTAN CILEXETIL 16 MG: 16 | 90 days supply | Qty: 90 | Fill #2

## 2020-04-28 DIAGNOSIS — N8189 Other female genital prolapse: Secondary | ICD-10-CM | POA: Diagnosis not present

## 2020-04-28 DIAGNOSIS — Z01419 Encounter for gynecological examination (general) (routine) without abnormal findings: Secondary | ICD-10-CM | POA: Diagnosis not present

## 2020-05-03 DIAGNOSIS — Z1231 Encounter for screening mammogram for malignant neoplasm of breast: Secondary | ICD-10-CM | POA: Diagnosis not present

## 2020-05-07 ENCOUNTER — Other Ambulatory Visit: Payer: Self-pay | Admitting: Interventional Cardiology

## 2020-05-07 MED FILL — METOPROLOL SUCCINATE ER 50: 50 | 90 days supply | Qty: 135 | Fill #0

## 2020-05-12 ENCOUNTER — Other Ambulatory Visit: Payer: Self-pay | Admitting: Interventional Cardiology

## 2020-05-12 MED FILL — ESOMEPRAZOLE MAG DR 40 MG C: 40 | 30 days supply | Qty: 60 | Fill #3

## 2020-05-13 ENCOUNTER — Other Ambulatory Visit: Payer: Self-pay | Admitting: Interventional Cardiology

## 2020-05-13 MED FILL — HYDROCHLOROTHIAZIDE 12.5 MG: 12.5 | 90 days supply | Qty: 90 | Fill #0

## 2020-05-23 ENCOUNTER — Other Ambulatory Visit: Payer: Self-pay | Admitting: Interventional Cardiology

## 2020-05-26 ENCOUNTER — Other Ambulatory Visit: Payer: Self-pay | Admitting: Interventional Cardiology

## 2020-05-26 MED FILL — POTASSIUM CHLORIDE CRYS ER: 20 | 90 days supply | Qty: 90 | Fill #0

## 2020-05-29 DIAGNOSIS — N8189 Other female genital prolapse: Secondary | ICD-10-CM | POA: Diagnosis not present

## 2020-05-31 MED FILL — CANDESARTAN CILEXETIL 16 MG: 16 | 90 days supply | Qty: 90 | Fill #3

## 2020-06-03 DIAGNOSIS — N8189 Other female genital prolapse: Secondary | ICD-10-CM | POA: Diagnosis not present

## 2020-07-07 ENCOUNTER — Other Ambulatory Visit: Payer: Self-pay | Admitting: Internal Medicine

## 2020-07-07 MED FILL — ESOMEPRAZOLE MAG DR 40 MG C: 40 | 30 days supply | Qty: 60 | Fill #0

## 2020-07-11 ENCOUNTER — Ambulatory Visit: Payer: 59 | Admitting: Interventional Cardiology

## 2020-08-04 ENCOUNTER — Other Ambulatory Visit: Payer: Self-pay | Admitting: Interventional Cardiology

## 2020-08-04 MED FILL — CANDESARTAN CILEXETIL 16 MG: 16 | 90 days supply | Qty: 90 | Fill #0

## 2020-08-04 MED FILL — METOPROLOL SUCCINATE ER 50: 50 | 90 days supply | Qty: 135 | Fill #0

## 2020-08-04 MED FILL — HYDROCHLOROTHIAZIDE 12.5 MG: 12.5 | 90 days supply | Qty: 90 | Fill #0

## 2020-08-04 MED FILL — ESOMEPRAZOLE MAG DR 40 MG C: 40 | 30 days supply | Qty: 60 | Fill #1

## 2020-08-04 MED FILL — POTASSIUM CHLORIDE CRYS ER: 20 | 90 days supply | Qty: 90 | Fill #0

## 2020-08-04 NOTE — Telephone Encounter (Signed)
Pt's medications were sent to pt's pharmacy as requested. Confirmation received.  

## 2020-11-02 NOTE — Progress Notes (Signed)
Cardiology Office Note:    Date:  11/03/2020   ID:  Christel Bielefeldt, DOB 05-14-1956, MRN [id number]  PCP:  [name], DO  Cardiologist:  Sinclair Grooms, MD   Referring MD: [name], DO   Chief Complaint  Patient presents with  . Hypertension    History of Present Illness:    Eden Saravia is a 65 y.o. female with a hx of primary hypertension,hyperlipidemia,premature beats, and family history of premature atherosclerosis.  M[place]on is doing well.  She denies symptoms.  She is pretty much shut it down since the pandemic started.  She works from home.  She is not getting consistent physical activity.  She increased risk for vascular events given primary hypertension, family history of premature atherosclerosis, and severely elevated lipids.  She is also overweight.  We have discussed risk factor modification as a primary method to prevent vascular events.  She is been resistant to adding medications because of known potential side effects of statins in particular.  Past Medical History:  Diagnosis Date  . ASYMPTOMATIC POSTMENOPAUSAL STATUS   . Blood transfusion without reported diagnosis 1980's  . Female cystocele   . Gall stones, common bile duct    dialated bile duct per [place] called today 11-13-2018  . GERD   . History of irregular heartbeat   . HYPERLIPIDEMIA   . HYPERTENSION   . Left atrial enlargement 12/2013   Noted on ECHO  . LVH (left ventricular hypertrophy) 12/2013   Mild, noted on ECHO  . OBESITY   . SCOLIOSIS   . 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 UMBILICAL HERNIORRHAPHY, HX OF    age 48  . Vitamin D deficiency   . Wears glasses     Past Surgical History:  Procedure Laterality Date  . BILIARY STENT PLACEMENT  11/30/2018   Procedure: BILIARY STENT PLACEMENT;  Surgeon: Ladene Artist, MD;  Location: [facility] ENDOSCOPY;  Service: Endoscopy;;  .  CHOLECYSTECTOMY N/A 11/26/2018   Procedure: LAPAROSCOPIC CHOLECYSTECTOMY WITH INTRAOPERATIVE CHOLANGIOGRAM;  Surgeon: Rolm Bookbinder, MD;  Location: [place];  Service: General;  Laterality: N/A;  . COLONOSCOPY  2012   Tics  . COLONOSCOPY  10/2015  . [place]  . ENDOSCOPIC RETROGRADE CHOLANGIOPANCREATOGRAPHY (ERCP) WITH PROPOFOL N/A 11/17/2018   Procedure: ENDOSCOPIC RETROGRADE CHOLANGIOPANCREATOGRAPHY (ERCP) WITH PROPOFOL;  Surgeon: Irene Shipper, MD;  Location: [facility] ENDOSCOPY;  Service: Endoscopy;  Laterality: N/A;  . ENDOSCOPIC RETROGRADE CHOLANGIOPANCREATOGRAPHY (ERCP) WITH PROPOFOL N/A 11/30/2018   Procedure: ENDOSCOPIC RETROGRADE CHOLANGIOPANCREATOGRAPHY (ERCP) WITH PROPOFOL;  Surgeon: Ladene Artist, MD;  Location: [facility] ENDOSCOPY;  Service: Endoscopy;  Laterality: N/A;  . ERCP N/A 01/22/2019   Procedure: ENDOSCOPIC RETROGRADE CHOLANGIOPANCREATOGRAPHY (ERCP);  Surgeon: Irene Shipper, MD;  Location: [facility] ENDOSCOPY;  Service: Endoscopy;  Laterality: N/A;  . REMOVAL OF STONES  11/17/2018   Procedure: REMOVAL OF STONES;  Surgeon: Irene Shipper, MD;  Location: [facility] ENDOSCOPY;  Service: Endoscopy;;  . REMOVAL OF STONES  11/30/2018   Procedure: REMOVAL OF STONES;  Surgeon: Ladene Artist, MD;  Location: [facility] ENDOSCOPY;  Service: Endoscopy;;  . [name]  11/17/2018   Procedure: SPHINCTEROTOMY;  Surgeon: Irene Shipper, MD;  Location: [facility] ENDOSCOPY;  Service: Endoscopy;;  . [place]  . UPPER GASTROINTESTINAL ENDOSCOPY  10/2015  . VAGINAL HYSTERECTOMY  1992    Current Medications: Current Meds  Medication Sig  . acetaminophen (  TYLENOL) 500 MG tablet Take 1,000 mg by mouth every 6 (six) hours as needed (pain).  Marland Kitchen aspirin EC 81 MG tablet Take 81 mg by mouth daily.  . candesartan (ATACAND) 16 MG tablet TAKE 1 TABLET BY MOUTH ONCE A DAY. MUST KEEP APPT IN MAY 2022 FOR FUTHER REFILLS.  . cholecalciferol (VITAMIN D3) 25 MCG (1000 UT) tablet Take 1,000 Units by  mouth daily.  Marland Kitchen esomeprazole (NEXIUM) 40 MG capsule TAKE 1 CAPSULE BY MOUTH TWICE DAILY BEFORE MEALS  . hydrochlorothiazide (MICROZIDE) 12.5 MG capsule TAKE 1 CAPSULE BY MOUTH EVERY MORNING. MUST KEEP APPT IN MAY 2022 FOR FURTHER REFILLS.  Marland Kitchen metoprolol succinate (TOPROL-XL) 50 MG 24 hr tablet TAKE 1 AND 1/2 TABLETS BY MOUTH ONCE A DAY WITH OR IMMEDIATELY FOLLOWING A MEAL. MUST KEEP APPT IN MAY 2022 FOR FURTHER REFILLS.  Marland Kitchen potassium chloride SA (KLOR-CON) 20 MEQ tablet TAKE 1 TABLET BY MOUTH ONCE A DAY. MUST KEEP APPT IN MAY 2022 FOR FURTHER REFILLS.  . Probiotic Product (PROBIOTIC DAILY PO) Take 1 capsule by mouth daily as needed (upset stomach).   . vitamin C (ASCORBIC ACID) 500 MG tablet Take 500 mg by mouth daily.     Allergies:   Patient has no known allergies.   Social History   Socioeconomic History  . Marital status: Married    Spouse name: Purcell Nails  . Number of children: Not on file  . Years of education: Not on file  . Highest education level: Not on file  Occupational History  . Occupation: Therapist, sports- Engineer, drilling: [employer]  Tobacco Use  . Smoking status: Never Smoker  . Smokeless tobacco: Never Used  Vaping Use  . Vaping Use: Never used  Substance and Sexual Activity  . Alcohol use: Yes    Alcohol/week: 0.0 standard drinks    Comment: occas wine  . Drug use: No  . Sexual activity: Not on file  Other Topics Concern  . Not on file  Social History Narrative   Married, lives with spouse and occ kid when back at home. 3 grown kids. 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   Social Determinants of Radio broadcast assistant Strain: Not on file  Food Insecurity: Not on file  Transportation Needs: Not on file  Physical Activity: Not on file  Stress: Not on file  Social Connections: Not on file     Family History: The patient's family history includes Breast cancer in her mother; Colon cancer in her father and paternal aunt; Diabetes in  her brother and another family member; Diabetes (age of onset: 56) in her mother; Heart attack in her brother; Heart disease in her father; Hyperlipidemia in an other family member; Hypertension in an other family member; Sarcoidosis in her brother; Stroke in her brother and father. There is no history of Esophageal cancer, Pancreatic cancer, Rectal cancer, Stomach cancer, or Colon polyps.  ROS:   Please see the history of present illness.    No orthopnea.  Had surgery in the summer 2020.  Has chronic anemia.  Even with anemia and a hemoglobin of 8.5 she denied having significant shortness of breath.  All other systems reviewed and are negative.  EKGs/Labs/Other Studies Reviewed:    The following studies were reviewed today: No new or recent data  EKG:  EKG normal sinus rhythm, biatrial abnormality, and left axis deviation.    Recent Labs: No results found for requested labs within last 8760 hours.  Recent Lipid  Panel    Component Value Date/Time   CHOL 195 11/21/2015 1021   TRIG 142.0 11/21/2015 1021   HDL 33.60 (L) 11/21/2015 1021   CHOLHDL 6 11/21/2015 1021   VLDL 28.4 11/21/2015 1021   LDLCALC 133 (H) 11/21/2015 1021   LDLDIRECT 146.8 07/13/2013 0859    Physical Exam:    VS:  BP (!) 174/104   Pulse 74   Ht 5\' 6"  (1.676 m)   Wt 211 lb 3.2 oz (95.8 kg)   SpO2 99%   BMI 34.09 kg/m     Wt Readings from Last 3 Encounters:  11/03/20 211 lb 3.2 oz (95.8 kg)  05/14/19 196 lb (88.9 kg)  01/22/19 190 lb (86.2 kg)     GEN: Overweight. No acute distress HEENT: Normal NECK: No JVD. LYMPHATICS: No lymphadenopathy CARDIAC: No murmur. RRR no gallop, or edema. VASCULAR:  Normal Pulses. No bruits. RESPIRATORY:  Clear to auscultation without rales, wheezing or rhonchi  ABDOMEN: Soft, non-tender, non-distended, No pulsatile mass, MUSCULOSKELETAL: No deformity  SKIN: Warm and dry NEUROLOGIC:  Alert and oriented x 3 PSYCHIATRIC:  Normal affect   ASSESSMENT:    1. Other  hyperlipidemia   2. Premature ventricular contractions   3. Essential hypertension   4. Morbid obesity ([place])    PLAN:    In order of problems listed above:  1. Resistant to management because of concern about potential medication side effects.  We have decided to perform a calcium score to quantitate risk and exclude asymptomatic CAD. 2. Some irregular heartbeats are heard today on exam.  These are likely PVCs. 3. Poor blood pressure control.  States that she has had her medication today and that she monitors the blood pressure from time to time and it is usually less than 140/90 mmHg.  Says she does not use excessive sodium in her diet. 4. Not exercising.  We discussed risk prevention.  Overall education and awareness concerning primary risk prevention was discussed in detail: LDL less than 70, hemoglobin A1c less than 7, blood pressure target less than 130/80 mmHg, >150 minutes of moderate aerobic activity per week, avoidance of smoking, weight control (via diet and exercise), and continued surveillance/management of/for obstructive sleep apnea.    Medication Adjustments/Labs and Tests Ordered: Current medicines are reviewed at length with the patient today.  Concerns regarding medicines are outlined above.  Orders Placed This Encounter  Procedures  . CT CARDIAC SCORING (SELF PAY ONLY)  . HgB A1c  . CBC  . Basic Metabolic Panel (BMET)  . Hepatic function panel  . EKG 12-Lead   No orders of the defined types were placed in this encounter.   Patient Instructions   Medication Instructions:  Your physician recommends that you continue on your current medications as directed. Please refer to the Current Medication list given to you today.  *If you need a refill on your cardiac medications before your next appointment, please call your pharmacy*   Lab Work: BMET, HgbA1c, CBC and liver labs will be drawn today If you have labs (blood work) drawn today and your tests are  completely normal, you will receive your results only by: Marland Kitchen MyChart Message (if you have MyChart) OR . A paper copy in the mail If you have any lab test that is abnormal or we need to change your treatment, we will call you to review the results.   Testing/Procedures: Your physician recommends you have a cardiac calcium score performed.   Follow-Up: At [facility], you and  your health needs are our priority.  As part of our continuing mission to provide you with exceptional heart care, we have created designated Provider Care Teams.  These Care Teams include your primary Cardiologist (physician) and Advanced Practice Providers (APPs -  Physician Assistants and Nurse Practitioners) who all work together to provide you with the care you need, when you need it.  We recommend signing up for the patient portal called "MyChart".  Sign up information is provided on this After Visit Summary.  MyChart is used to connect with patients for Virtual Visits (Telemedicine).  Patients are able to view lab/test results, encounter notes, upcoming appointments, etc.  Non-urgent messages can be sent to your provider as well.   To learn more about what you can do with MyChart, go to [email or web address].    Your next appointment:   1 year(s)  The format for your next appointment:   In Person  Provider:   You may see Sinclair Grooms, MD or one of the following Advanced Practice Providers on your designated Care Team:    Kathyrn Drown, NP    Other Instructions Check you blood pressure 2-3 times a week and call with those readings in 2 weeks.   Low-Sodium Eating Plan Sodium, which is an element that makes up salt, helps you maintain a healthy balance of fluids in your body. Too much sodium can increase your blood pressure and cause fluid and waste to be held in your body. Your health care provider or dietitian may recommend following this plan if you have high blood pressure (hypertension), kidney  disease, liver disease, or heart failure. Eating less sodium can help lower your blood pressure, reduce swelling, and protect your heart, liver, and kidneys. What are tips for following this plan? Reading food labels  The Nutrition Facts label lists the amount of sodium in one serving of the food. If you eat more than one serving, you must multiply the listed amount of sodium by the number of servings.  Choose foods with less than 140 mg of sodium per serving.  Avoid foods with 300 mg of sodium or more per serving. Shopping  Look for lower-sodium products, often labeled as "low-sodium" or "no salt added."  Always check the sodium content, even if foods are labeled as "unsalted" or "no salt added."  Buy fresh foods. ? Avoid canned foods and pre-made or frozen meals. ? Avoid canned, cured, or processed meats.  Buy breads that have less than 80 mg of sodium per slice.   Cooking  Eat more home-cooked food and less restaurant, buffet, and fast food.  Avoid adding salt when cooking. Use salt-free seasonings or herbs instead of table salt or sea salt. Check with your health care provider or pharmacist before using salt substitutes.  Cook with plant-based oils, such as canola, sunflower, or olive oil.   Meal planning  When eating at a restaurant, ask that your food be prepared with less salt or no salt, if possible. Avoid dishes labeled as brined, pickled, cured, smoked, or made with soy sauce, miso, or teriyaki sauce.  Avoid foods that contain MSG (monosodium glutamate). MSG is sometimes added to [place] food, bouillon, and some canned foods.  Make meals that can be grilled, baked, poached, roasted, or steamed. These are generally made with less sodium. General information Most people on this plan should limit their sodium intake to 1,500-2,000 mg (milligrams) of sodium each day. What foods should I eat? Fruits Fresh, frozen, or canned fruit.  Fruit juice. Vegetables Fresh or frozen  vegetables. "No salt added" canned vegetables. "No salt added" tomato sauce and paste. Low-sodium or reduced-sodium tomato and vegetable juice. Grains Low-sodium cereals, including oats, puffed wheat and rice, and shredded wheat. Low-sodium crackers. Unsalted rice. Unsalted pasta. Low-sodium bread. Whole-grain breads and whole-grain pasta. Meats and other proteins Fresh or frozen (no salt added) meat, poultry, seafood, and fish. Low-sodium canned tuna and salmon. Unsalted nuts. Dried peas, beans, and lentils without added salt. Unsalted canned beans. Eggs. Unsalted nut butters. Dairy Milk. Soy milk. Cheese that is naturally low in sodium, such as ricotta cheese, fresh mozzarella, or Swiss cheese. Low-sodium or reduced-sodium cheese. Cream cheese. Yogurt. Seasonings and condiments Fresh and dried herbs and spices. Salt-free seasonings. Low-sodium mustard and ketchup. Sodium-free salad dressing. Sodium-free light mayonnaise. Fresh or refrigerated horseradish. Lemon juice. Vinegar. Other foods Homemade, reduced-sodium, or low-sodium soups. Unsalted popcorn and pretzels. Low-salt or salt-free chips. The items listed above may not be a complete list of foods and beverages you can eat. Contact a dietitian for more information. What foods should I avoid? Vegetables Sauerkraut, pickled vegetables, and relishes. Olives. [place] fries. Onion rings. Regular canned vegetables (not low-sodium or reduced-sodium). Regular canned tomato sauce and paste (not low-sodium or reduced-sodium). Regular tomato and vegetable juice (not low-sodium or reduced-sodium). Frozen vegetables in sauces. Grains Instant hot cereals. Bread stuffing, pancake, and biscuit mixes. Croutons. Seasoned rice or pasta mixes. Noodle soup cups. Boxed or frozen macaroni and cheese. Regular salted crackers. Self-rising flour. Meats and other proteins Meat or fish that is salted, canned, smoked, spiced, or pickled. Precooked or cured meat, such as  sausages or meat loaves. Tomasa BlaseBacon. Ham. Pepperoni. Hot dogs. Corned beef. Chipped beef. Salt pork. Jerky. Pickled herring. Anchovies and sardines. Regular canned tuna. Salted nuts. Dairy Processed cheese and cheese spreads. Hard cheeses. Cheese curds. Blue cheese. Feta cheese. String cheese. Regular cottage cheese. Buttermilk. Canned milk. Fats and oils Salted butter. Regular margarine. Ghee. Bacon fat. Seasonings and condiments Onion salt, garlic salt, seasoned salt, table salt, and sea salt. Canned and packaged gravies. Worcestershire sauce. Tartar sauce. Barbecue sauce. Teriyaki sauce. Soy sauce, including reduced-sodium. Steak sauce. Fish sauce. Oyster sauce. Cocktail sauce. Horseradish that you find on the shelf. Regular ketchup and mustard. Meat flavorings and tenderizers. Bouillon cubes. Hot sauce. Pre-made or packaged marinades. Pre-made or packaged taco seasonings. Relishes. Regular salad dressings. Salsa. Other foods Salted popcorn and pretzels. Corn chips and puffs. Potato and tortilla chips. Canned or dried soups. Pizza. Frozen entrees and pot pies. The items listed above may not be a complete list of foods and beverages you should avoid. Contact a dietitian for more information. Summary  Eating less sodium can help lower your blood pressure, reduce swelling, and protect your heart, liver, and kidneys.  Most people on this plan should limit their sodium intake to 1,500-2,000 mg (milligrams) of sodium each day.  Canned, boxed, and frozen foods are high in sodium. Restaurant foods, fast foods, and pizza are also very high in sodium. You also get sodium by adding salt to food.  Try to cook at home, eat more fresh fruits and vegetables, and eat less fast food and canned, processed, or prepared foods. This information is not intended to replace advice given to you by your health care provider. Make sure you discuss any questions you have with your health care provider. Document Revised:  07/20/2019 Document Reviewed: 05/16/2019 Elsevier Patient Education  2021 [employer]     Signed, Lyn RecordsHenry W Daissy Yerian  III, MD  11/03/2020 4:44 PM    [facility]

## 2020-11-03 ENCOUNTER — Other Ambulatory Visit: Payer: Self-pay

## 2020-11-03 ENCOUNTER — Ambulatory Visit: Payer: 59 | Admitting: Interventional Cardiology

## 2020-11-03 ENCOUNTER — Encounter: Payer: Self-pay | Admitting: Interventional Cardiology

## 2020-11-03 ENCOUNTER — Other Ambulatory Visit: Payer: Self-pay | Admitting: Interventional Cardiology

## 2020-11-03 VITALS — BP 174/104 | HR 74 | Ht 66.0 in | Wt 211.2 lb

## 2020-11-03 DIAGNOSIS — E7849 Other hyperlipidemia: Secondary | ICD-10-CM | POA: Diagnosis not present

## 2020-11-03 DIAGNOSIS — I493 Ventricular premature depolarization: Secondary | ICD-10-CM

## 2020-11-03 DIAGNOSIS — I10 Essential (primary) hypertension: Secondary | ICD-10-CM | POA: Diagnosis not present

## 2020-11-03 MED FILL — Esomeprazole Magnesium Cap Delayed Release 40 MG (Base Eq): ORAL | 30 days supply | Qty: 60 | Fill #0 | Status: AC

## 2020-11-03 NOTE — Patient Instructions (Addendum)
Medication Instructions:  Your physician recommends that you continue on your current medications as directed. Please refer to the Current Medication list given to you today.  *If you need a refill on your cardiac medications before your next appointment, please call your pharmacy*   Lab Work: BMET, HgbA1c, CBC and liver labs will be drawn today If you have labs (blood work) drawn today and your tests are completely normal, you will receive your results only by: Marland Kitchen MyChart Message (if you have MyChart) OR . A paper copy in the mail If you have any lab test that is abnormal or we need to change your treatment, we will call you to review the results.   Testing/Procedures: Your physician recommends you have a cardiac calcium score performed.   Follow-Up: At [facility], you and your health needs are our priority.  As part of our continuing mission to provide you with exceptional heart care, we have created designated Provider Care Teams.  These Care Teams include your primary Cardiologist (physician) and Advanced Practice Providers (APPs -  Physician Assistants and Nurse Practitioners) who all work together to provide you with the care you need, when you need it.  We recommend signing up for the patient portal called "MyChart".  Sign up information is provided on this After Visit Summary.  MyChart is used to connect with patients for Virtual Visits (Telemedicine).  Patients are able to view lab/test results, encounter notes, upcoming appointments, etc.  Non-urgent messages can be sent to your provider as well.   To learn more about what you can do with MyChart, go to [email or web address].    Your next appointment:   1 year(s)  The format for your next appointment:   In Person  Provider:   You may see Sinclair Grooms, MD or one of the following Advanced Practice Providers on your designated Care Team:    Kathyrn Drown, NP    Other Instructions Check you blood pressure 2-3  times a week and call with those readings in 2 weeks.   Low-Sodium Eating Plan Sodium, which is an element that makes up salt, helps you maintain a healthy balance of fluids in your body. Too much sodium can increase your blood pressure and cause fluid and waste to be held in your body. Your health care provider or dietitian may recommend following this plan if you have high blood pressure (hypertension), kidney disease, liver disease, or heart failure. Eating less sodium can help lower your blood pressure, reduce swelling, and protect your heart, liver, and kidneys. What are tips for following this plan? Reading food labels  The Nutrition Facts label lists the amount of sodium in one serving of the food. If you eat more than one serving, you must multiply the listed amount of sodium by the number of servings.  Choose foods with less than 140 mg of sodium per serving.  Avoid foods with 300 mg of sodium or more per serving. Shopping  Look for lower-sodium products, often labeled as "low-sodium" or "no salt added."  Always check the sodium content, even if foods are labeled as "unsalted" or "no salt added."  Buy fresh foods. ? Avoid canned foods and pre-made or frozen meals. ? Avoid canned, cured, or processed meats.  Buy breads that have less than 80 mg of sodium per slice.   Cooking  Eat more home-cooked food and less restaurant, buffet, and fast food.  Avoid adding salt when cooking. Use salt-free seasonings or herbs instead of table  salt or sea salt. Check with your health care provider or pharmacist before using salt substitutes.  Cook with plant-based oils, such as canola, sunflower, or olive oil.   Meal planning  When eating at a restaurant, ask that your food be prepared with less salt or no salt, if possible. Avoid dishes labeled as brined, pickled, cured, smoked, or made with soy sauce, miso, or teriyaki sauce.  Avoid foods that contain MSG (monosodium glutamate). MSG is  sometimes added to [place] food, bouillon, and some canned foods.  Make meals that can be grilled, baked, poached, roasted, or steamed. These are generally made with less sodium. General information Most people on this plan should limit their sodium intake to 1,500-2,000 mg (milligrams) of sodium each day. What foods should I eat? Fruits Fresh, frozen, or canned fruit. Fruit juice. Vegetables Fresh or frozen vegetables. "No salt added" canned vegetables. "No salt added" tomato sauce and paste. Low-sodium or reduced-sodium tomato and vegetable juice. Grains Low-sodium cereals, including oats, puffed wheat and rice, and shredded wheat. Low-sodium crackers. Unsalted rice. Unsalted pasta. Low-sodium bread. Whole-grain breads and whole-grain pasta. Meats and other proteins Fresh or frozen (no salt added) meat, poultry, seafood, and fish. Low-sodium canned tuna and salmon. Unsalted nuts. Dried peas, beans, and lentils without added salt. Unsalted canned beans. Eggs. Unsalted nut butters. Dairy Milk. Soy milk. Cheese that is naturally low in sodium, such as ricotta cheese, fresh mozzarella, or Swiss cheese. Low-sodium or reduced-sodium cheese. Cream cheese. Yogurt. Seasonings and condiments Fresh and dried herbs and spices. Salt-free seasonings. Low-sodium mustard and ketchup. Sodium-free salad dressing. Sodium-free light mayonnaise. Fresh or refrigerated horseradish. Lemon juice. Vinegar. Other foods Homemade, reduced-sodium, or low-sodium soups. Unsalted popcorn and pretzels. Low-salt or salt-free chips. The items listed above may not be a complete list of foods and beverages you can eat. Contact a dietitian for more information. What foods should I avoid? Vegetables Sauerkraut, pickled vegetables, and relishes. Olives. [place] fries. Onion rings. Regular canned vegetables (not low-sodium or reduced-sodium). Regular canned tomato sauce and paste (not low-sodium or reduced-sodium). Regular tomato and  vegetable juice (not low-sodium or reduced-sodium). Frozen vegetables in sauces. Grains Instant hot cereals. Bread stuffing, pancake, and biscuit mixes. Croutons. Seasoned rice or pasta mixes. Noodle soup cups. Boxed or frozen macaroni and cheese. Regular salted crackers. Self-rising flour. Meats and other proteins Meat or fish that is salted, canned, smoked, spiced, or pickled. Precooked or cured meat, such as sausages or meat loaves. Berniece Salines. Ham. Pepperoni. Hot dogs. Corned beef. Chipped beef. Salt pork. Jerky. Pickled herring. Anchovies and sardines. Regular canned tuna. Salted nuts. Dairy Processed cheese and cheese spreads. Hard cheeses. Cheese curds. Blue cheese. Feta cheese. String cheese. Regular cottage cheese. Buttermilk. Canned milk. Fats and oils Salted butter. Regular margarine. Ghee. Bacon fat. Seasonings and condiments Onion salt, garlic salt, seasoned salt, table salt, and sea salt. Canned and packaged gravies. Worcestershire sauce. Tartar sauce. Barbecue sauce. Teriyaki sauce. Soy sauce, including reduced-sodium. Steak sauce. Fish sauce. Oyster sauce. Cocktail sauce. Horseradish that you find on the shelf. Regular ketchup and mustard. Meat flavorings and tenderizers. Bouillon cubes. Hot sauce. Pre-made or packaged marinades. Pre-made or packaged taco seasonings. Relishes. Regular salad dressings. Salsa. Other foods Salted popcorn and pretzels. Corn chips and puffs. Potato and tortilla chips. Canned or dried soups. Pizza. Frozen entrees and pot pies. The items listed above may not be a complete list of foods and beverages you should avoid. Contact a dietitian for more information. Summary  Eating less sodium  can help lower your blood pressure, reduce swelling, and protect your heart, liver, and kidneys.  Most people on this plan should limit their sodium intake to 1,500-2,000 mg (milligrams) of sodium each day.  Canned, boxed, and frozen foods are high in sodium. Restaurant foods,  fast foods, and pizza are also very high in sodium. You also get sodium by adding salt to food.  Try to cook at home, eat more fresh fruits and vegetables, and eat less fast food and canned, processed, or prepared foods. This information is not intended to replace advice given to you by your health care provider. Make sure you discuss any questions you have with your health care provider. Document Revised: 07/20/2019 Document Reviewed: 05/16/2019 Elsevier Patient Education  2021 [employer].

## 2020-11-04 ENCOUNTER — Other Ambulatory Visit (HOSPITAL_COMMUNITY): Payer: Self-pay

## 2020-11-04 LAB — CBC
Hematocrit: 39.3 % (ref 34.0–46.6)
Hemoglobin: 12.7 g/dL (ref 11.1–15.9)
MCH: 26.5 pg — ABNORMAL LOW (ref 26.6–33.0)
MCHC: 32.3 g/dL (ref 31.5–35.7)
MCV: 82 fL (ref 79–97)
Platelets: 248 10*3/uL (ref 150–450)
RBC: 4.8 x10E6/uL (ref 3.77–5.28)
RDW: 15.9 % — ABNORMAL HIGH (ref 11.7–15.4)
WBC: 6.8 10*3/uL (ref 3.4–10.8)

## 2020-11-04 LAB — HEPATIC FUNCTION PANEL
ALT: 10 IU/L (ref 0–32)
AST: 15 IU/L (ref 0–40)
Albumin: 4.2 g/dL (ref 3.8–4.8)
Alkaline Phosphatase: 111 IU/L (ref 44–121)
Bilirubin Total: 0.4 mg/dL (ref 0.0–1.2)
Bilirubin, Direct: 0.11 mg/dL (ref 0.00–0.40)
Total Protein: 7.4 g/dL (ref 6.0–8.5)

## 2020-11-04 LAB — BASIC METABOLIC PANEL
BUN/Creatinine Ratio: 14 (ref 12–28)
BUN: 12 mg/dL (ref 8–27)
CO2: 26 mmol/L (ref 20–29)
Calcium: 9.7 mg/dL (ref 8.7–10.3)
Chloride: 102 mmol/L (ref 96–106)
Creatinine, Ser: 0.87 mg/dL (ref 0.57–1.00)
Glucose: 99 mg/dL (ref 65–99)
Potassium: 4.4 mmol/L (ref 3.5–5.2)
Sodium: 143 mmol/L (ref 134–144)
eGFR: 73 mL/min/{1.73_m2} (ref >59)

## 2020-11-04 LAB — HEMOGLOBIN A1C
Est. average glucose Bld gHb Est-mCnc: 94 mg/dL
Hgb A1c MFr Bld: 4.9 % (ref 4.8–5.6)

## 2020-11-05 ENCOUNTER — Other Ambulatory Visit (HOSPITAL_COMMUNITY): Payer: Self-pay

## 2020-11-05 ENCOUNTER — Other Ambulatory Visit: Payer: Self-pay | Admitting: Interventional Cardiology

## 2020-11-05 MED ORDER — METOPROLOL SUCCINATE ER 50 MG PO TB24
75.0000 mg | ORAL_TABLET | Freq: Every day | ORAL | 3 refills | Status: DC
  Filled 2020-11-13: qty 135, 90d supply, fill #0
  Filled 2021-02-04: qty 135, 90d supply, fill #1
  Filled 2021-05-11: qty 135, 90d supply, fill #2
  Filled 2021-08-09: qty 135, 90d supply, fill #3

## 2020-11-05 MED ORDER — HYDROCHLOROTHIAZIDE 12.5 MG PO CAPS
12.5000 mg | ORAL_CAPSULE | Freq: Every morning | ORAL | 3 refills | Status: DC
  Filled 2020-11-13: qty 90, 90d supply, fill #0
  Filled 2021-02-04: qty 90, 90d supply, fill #1
  Filled 2021-05-05: qty 90, 90d supply, fill #2
  Filled 2021-08-04: qty 90, 90d supply, fill #3
  Filled ????-??-??: fill #0

## 2020-11-10 ENCOUNTER — Other Ambulatory Visit (HOSPITAL_COMMUNITY): Payer: Self-pay

## 2020-11-11 ENCOUNTER — Encounter: Payer: Self-pay | Admitting: Internal Medicine

## 2020-11-13 ENCOUNTER — Other Ambulatory Visit (HOSPITAL_COMMUNITY): Payer: Self-pay

## 2020-11-19 ENCOUNTER — Other Ambulatory Visit (HOSPITAL_COMMUNITY): Payer: Self-pay

## 2020-11-19 ENCOUNTER — Other Ambulatory Visit: Payer: Self-pay | Admitting: Interventional Cardiology

## 2020-11-19 MED ORDER — POTASSIUM CHLORIDE CRYS ER 20 MEQ PO TBCR
20.0000 meq | EXTENDED_RELEASE_TABLET | Freq: Every day | ORAL | 3 refills | Status: DC
  Filled 2020-11-19: qty 90, 90d supply, fill #0
  Filled 2021-02-18: qty 90, 90d supply, fill #1
  Filled 2021-05-13: qty 90, 90d supply, fill #2
  Filled 2021-08-18: qty 90, 90d supply, fill #3

## 2020-11-25 ENCOUNTER — Other Ambulatory Visit (HOSPITAL_COMMUNITY): Payer: Self-pay

## 2020-11-25 MED ORDER — CARESTART COVID-19 HOME TEST VI KIT
PACK | 0 refills | Status: DC
  Filled 2020-11-25: qty 4, 4d supply, fill #0

## 2020-11-28 ENCOUNTER — Other Ambulatory Visit: Payer: Self-pay | Admitting: Interventional Cardiology

## 2020-11-28 ENCOUNTER — Other Ambulatory Visit (HOSPITAL_COMMUNITY): Payer: Self-pay

## 2020-11-28 MED ORDER — CANDESARTAN CILEXETIL 16 MG PO TABS
ORAL_TABLET | ORAL | 3 refills | Status: DC
  Filled 2020-11-28: qty 90, 90d supply, fill #0
  Filled 2021-03-02: qty 90, 90d supply, fill #1
  Filled 2021-05-27: qty 90, 90d supply, fill #2
  Filled 2021-08-25: qty 90, 90d supply, fill #3

## 2020-12-01 ENCOUNTER — Other Ambulatory Visit (HOSPITAL_COMMUNITY): Payer: Self-pay

## 2020-12-05 ENCOUNTER — Other Ambulatory Visit (HOSPITAL_COMMUNITY): Payer: Self-pay

## 2020-12-08 ENCOUNTER — Other Ambulatory Visit (HOSPITAL_COMMUNITY): Payer: Self-pay

## 2020-12-22 DIAGNOSIS — H43811 Vitreous degeneration, right eye: Secondary | ICD-10-CM | POA: Diagnosis not present

## 2021-01-02 MED FILL — Esomeprazole Magnesium Cap Delayed Release 40 MG (Base Eq): ORAL | 30 days supply | Qty: 60 | Fill #1 | Status: AC

## 2021-01-03 ENCOUNTER — Other Ambulatory Visit (HOSPITAL_COMMUNITY): Payer: Self-pay

## 2021-01-09 DIAGNOSIS — H43811 Vitreous degeneration, right eye: Secondary | ICD-10-CM | POA: Diagnosis not present

## 2021-01-21 DIAGNOSIS — D699 Hemorrhagic condition, unspecified: Secondary | ICD-10-CM | POA: Diagnosis not present

## 2021-01-21 DIAGNOSIS — Z7982 Long term (current) use of aspirin: Secondary | ICD-10-CM | POA: Diagnosis not present

## 2021-02-05 ENCOUNTER — Other Ambulatory Visit (HOSPITAL_COMMUNITY): Payer: Self-pay

## 2021-02-19 ENCOUNTER — Other Ambulatory Visit (HOSPITAL_COMMUNITY): Payer: Self-pay

## 2021-03-02 ENCOUNTER — Other Ambulatory Visit (HOSPITAL_COMMUNITY): Payer: Self-pay

## 2021-03-03 ENCOUNTER — Other Ambulatory Visit (HOSPITAL_COMMUNITY): Payer: Self-pay

## 2021-03-04 ENCOUNTER — Other Ambulatory Visit (HOSPITAL_COMMUNITY): Payer: Self-pay

## 2021-03-08 ENCOUNTER — Other Ambulatory Visit: Payer: Self-pay | Admitting: Internal Medicine

## 2021-03-09 ENCOUNTER — Other Ambulatory Visit (HOSPITAL_COMMUNITY): Payer: Self-pay

## 2021-03-09 MED ORDER — ESOMEPRAZOLE MAGNESIUM 40 MG PO CPDR
40.0000 mg | DELAYED_RELEASE_CAPSULE | Freq: Two times a day (BID) | ORAL | 3 refills | Status: DC
  Filled 2021-03-09: qty 60, 30d supply, fill #0
  Filled 2021-05-05: qty 60, 30d supply, fill #1
  Filled 2021-07-02: qty 60, 30d supply, fill #2
  Filled 2021-09-03: qty 60, 30d supply, fill #3

## 2021-03-17 ENCOUNTER — Other Ambulatory Visit (HOSPITAL_BASED_OUTPATIENT_CLINIC_OR_DEPARTMENT_OTHER): Payer: Self-pay

## 2021-04-03 ENCOUNTER — Ambulatory Visit: Payer: 59

## 2021-04-21 ENCOUNTER — Ambulatory Visit: Payer: 59

## 2021-04-29 DIAGNOSIS — Z0000 Encounter for general adult medical examination without abnormal findings: Secondary | ICD-10-CM | POA: Diagnosis not present

## 2021-04-29 DIAGNOSIS — I10 Essential (primary) hypertension: Secondary | ICD-10-CM | POA: Diagnosis not present

## 2021-04-29 DIAGNOSIS — R7309 Other abnormal glucose: Secondary | ICD-10-CM | POA: Diagnosis not present

## 2021-04-29 DIAGNOSIS — R946 Abnormal results of thyroid function studies: Secondary | ICD-10-CM | POA: Diagnosis not present

## 2021-04-29 DIAGNOSIS — E559 Vitamin D deficiency, unspecified: Secondary | ICD-10-CM | POA: Diagnosis not present

## 2021-05-06 ENCOUNTER — Other Ambulatory Visit (HOSPITAL_COMMUNITY): Payer: Self-pay

## 2021-05-06 DIAGNOSIS — R946 Abnormal results of thyroid function studies: Secondary | ICD-10-CM | POA: Diagnosis not present

## 2021-05-06 DIAGNOSIS — Z0000 Encounter for general adult medical examination without abnormal findings: Secondary | ICD-10-CM | POA: Diagnosis not present

## 2021-05-06 DIAGNOSIS — E559 Vitamin D deficiency, unspecified: Secondary | ICD-10-CM | POA: Diagnosis not present

## 2021-05-06 DIAGNOSIS — I10 Essential (primary) hypertension: Secondary | ICD-10-CM | POA: Diagnosis not present

## 2021-05-06 DIAGNOSIS — R7309 Other abnormal glucose: Secondary | ICD-10-CM | POA: Diagnosis not present

## 2021-05-09 DIAGNOSIS — Z1231 Encounter for screening mammogram for malignant neoplasm of breast: Secondary | ICD-10-CM | POA: Diagnosis not present

## 2021-05-11 ENCOUNTER — Other Ambulatory Visit (HOSPITAL_COMMUNITY): Payer: Self-pay

## 2021-05-12 ENCOUNTER — Other Ambulatory Visit (HOSPITAL_COMMUNITY): Payer: Self-pay

## 2021-05-12 DIAGNOSIS — Z01419 Encounter for gynecological examination (general) (routine) without abnormal findings: Secondary | ICD-10-CM | POA: Diagnosis not present

## 2021-05-14 ENCOUNTER — Other Ambulatory Visit (HOSPITAL_COMMUNITY): Payer: Self-pay

## 2021-05-26 ENCOUNTER — Other Ambulatory Visit (HOSPITAL_COMMUNITY): Payer: Self-pay

## 2021-05-26 MED ORDER — AMOXICILLIN 500 MG PO CAPS
ORAL_CAPSULE | ORAL | 0 refills | Status: DC
  Filled 2021-05-26: qty 21, 7d supply, fill #0

## 2021-05-28 ENCOUNTER — Other Ambulatory Visit (HOSPITAL_COMMUNITY): Payer: Self-pay

## 2021-05-29 ENCOUNTER — Other Ambulatory Visit (HOSPITAL_COMMUNITY): Payer: Self-pay

## 2021-07-03 ENCOUNTER — Other Ambulatory Visit (HOSPITAL_COMMUNITY): Payer: Self-pay

## 2021-08-05 ENCOUNTER — Other Ambulatory Visit (HOSPITAL_COMMUNITY): Payer: Self-pay

## 2021-08-10 ENCOUNTER — Other Ambulatory Visit (HOSPITAL_COMMUNITY): Payer: Self-pay

## 2021-08-11 ENCOUNTER — Other Ambulatory Visit (HOSPITAL_COMMUNITY): Payer: Self-pay

## 2021-08-18 ENCOUNTER — Encounter: Payer: Self-pay | Admitting: Internal Medicine

## 2021-08-18 ENCOUNTER — Ambulatory Visit: Payer: 59 | Admitting: Internal Medicine

## 2021-08-18 ENCOUNTER — Telehealth: Payer: Self-pay | Admitting: Internal Medicine

## 2021-08-18 VITALS — BP 160/90 | HR 72 | Ht 66.0 in | Wt 214.6 lb

## 2021-08-18 DIAGNOSIS — R1310 Dysphagia, unspecified: Secondary | ICD-10-CM

## 2021-08-18 DIAGNOSIS — K222 Esophageal obstruction: Secondary | ICD-10-CM

## 2021-08-18 DIAGNOSIS — Z800 Family history of malignant neoplasm of digestive organs: Secondary | ICD-10-CM | POA: Diagnosis not present

## 2021-08-18 DIAGNOSIS — Z86010 Personal history of colonic polyps: Secondary | ICD-10-CM | POA: Diagnosis not present

## 2021-08-18 DIAGNOSIS — K219 Gastro-esophageal reflux disease without esophagitis: Secondary | ICD-10-CM

## 2021-08-18 NOTE — Patient Instructions (Addendum)
If you are age 68 or older, your body mass index should be between 23-30. Your Body mass index is 34.64 kg/m. If this is out of the aforementioned range listed, please consider follow up with your Primary Care Provider.  If you are age 45 or younger, your body mass index should be between 19-25. Your Body mass index is 34.64 kg/m. If this is out of the aformentioned range listed, please consider follow up with your Primary Care Provider.   ________________________________________________________  The [facility] providers would like to encourage you to use [facility] to communicate with providers for non-urgent requests or questions.  Due to long hold times on the telephone, sending your provider a message by [facility] may be a faster and more efficient way to get a response.  Please allow 48 business hours for a response.  Please remember that this is for non-urgent requests.  _______________________________________________________  Call us back to schedule your colonoscopy

## 2021-08-18 NOTE — Progress Notes (Signed)
HISTORY OF PRESENT ILLNESS:  Janice Rodriguez is a 66 y.o. female, medical records personnel at [facility], who has been followed in this office for GERD and a history of adenomatous colon polyps.  She also has a history of cholecystectomy complicated by bile leak which was successfully treated with temporary biliary stent placement.  She presents today with complaints of dysphagia.  She reports intermittent solid food dysphagia.  She points to the proximal esophagus.  She does have chronic GERD for which she takes Nexium.  No active reflux symptoms on Nexium.  Her last upper endoscopy was performed Nov 06, 2015.  She was found to have mild esophagitis as well as a distal esophageal stricture.  She also underwent complete colonoscopy at that time for personal history of adenomatous colon polyps and a family history of colon cancer in a parent (71s).  The examination revealed diverticulosis.  Follow-up in 5 years recommended.  She denies lower GI complaints.  Her health has been otherwise good.  Blood work from May 2022 shows normal comprehensive metabolic panel.  Hemoglobin 12.7.  REVIEW OF SYSTEMS:  All non-GI ROS negative. 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, CXR: No definite evidence to suggest active intrathoracic tuberculosis    UMBILICAL HERNIORRHAPHY, HX OF    age 12   Vitamin D deficiency    Wears glasses     Past Surgical History:  Procedure Laterality Date   BILIARY STENT PLACEMENT  11/30/2018   Procedure:  BILIARY STENT PLACEMENT;  Surgeon: Ladene Artist, MD;  Location: [facility] ENDOSCOPY;  Service: Endoscopy;;   CHOLECYSTECTOMY N/A 11/26/2018   Procedure: LAPAROSCOPIC CHOLECYSTECTOMY WITH INTRAOPERATIVE CHOLANGIOGRAM;  Surgeon: Rolm Bookbinder, MD;  Location: [facility];  Service: General;  Laterality: N/A;   COLONOSCOPY  2012   Tics   COLONOSCOPY  10/2015   ECTOPIC PREGNANCY SURGERY  1983   ENDOSCOPIC RETROGRADE CHOLANGIOPANCREATOGRAPHY (ERCP) WITH PROPOFOL N/A 11/17/2018   Procedure: ENDOSCOPIC RETROGRADE CHOLANGIOPANCREATOGRAPHY (ERCP) WITH PROPOFOL;  Surgeon: Irene Shipper, MD;  Location: [facility] ENDOSCOPY;  Service: Endoscopy;  Laterality: N/A;   ENDOSCOPIC RETROGRADE CHOLANGIOPANCREATOGRAPHY (ERCP) WITH PROPOFOL N/A 11/30/2018   Procedure: ENDOSCOPIC RETROGRADE CHOLANGIOPANCREATOGRAPHY (ERCP) WITH PROPOFOL;  Surgeon: Ladene Artist, MD;  Location[facility] ENDOSCOPY;  Service: Endoscopy;  Laterality: N/A;   ERCP N/A 01/22/2019   Procedure: ENDOSCOPIC RETROGRADE CHOLANGIOPANCREATOGRAPHY (ERCP);  Surgeon: Irene Shipper, MD;  Location: [facility];  Service: Endoscopy;  Laterality: N/A;   REMOVAL OF STONES  11/17/2018   Procedure: REMOVAL OF STONES;  Surgeon: Irene Shipper, MD;  Location: [facility] ENDOSCOPY;  Service: Endoscopy;;   REMOVAL OF STONES  11/30/2018   Procedure: REMOVAL OF STONES;  Surgeon: Ladene Artist, MD;  Location: [facility] ENDOSCOPY;  Service: Endoscopy;;   SPHINCTEROTOMY  11/17/2018   Procedure: [name];  Surgeon: Irene Shipper, MD;  Location: [facility] ENDOSCOPY;  Service: Endoscopy;;   UMBILICAL HERNIA REPAIR  1963   UPPER GASTROINTESTINAL ENDOSCOPY  10/2015   VAGINAL HYSTERECTOMY  1992    Social History Janice Rodriguez  reports that she has never smoked. She has never used smokeless tobacco. She reports current alcohol use. She reports that she does not use drugs.  family history includes Breast cancer in her mother; Colon cancer in her father and paternal aunt; Diabetes in her brother and  another family member; Diabetes (age of onset: 59) in her mother; Heart attack in her brother; Heart disease in her father; Hyperlipidemia in an other family member; Hypertension in her brother and another family member; Sarcoidosis in her brother; Stroke in her brother and father.  No Known Allergies     PHYSICAL EXAMINATION: Vital signs: BP (!) 160/90    Pulse 72    Ht 5\' 6"  (1.676 m)    Wt 214 lb 9.6 oz (97.3 kg)    BMI 34.64 kg/m   Constitutional: generally well-appearing, no acute distress Psychiatric: alert and oriented x3, cooperative Eyes: extraocular movements intact, anicteric, conjunctiva pink Mouth: oral pharynx moist, no lesions Neck: supple no lymphadenopathy Cardiovascular: heart regular rate and rhythm, no murmur Lungs: clear to auscultation bilaterally Abdomen: soft, nontender, nondistended, no obvious ascites, no peritoneal signs, normal bowel sounds, no organomegaly Rectal: Deferred until colonoscopy Extremities: no clubbing, cyanosis, or lower extremity edema bilaterally Skin: no lesions on visible extremities Neuro: No focal deficits.  Cranial nerves intact  ASSESSMENT:  1.  GERD.  Classic symptoms controlled with PPI. 2.  Intermittent solid food dysphagia.  Suspect secondary to esophageal stricture noted previously on endoscopy 3.  Personal history of adenomatous colon polyps and family history of colon cancer.  Last colonoscopy 2017.  Due for surveillance 4.  History of bile leak postcholecystectomy treated successfully with ERCP and temporary stent placement 5.  General medical problems.  Stable   PLAN:  1.  Reflux precautions 2.  Continue Nexium 40 mg daily 3.  Schedule upper endoscopy with probable esophageal dilation.The nature of the procedure, as well as the risks, benefits, and alternatives were carefully and thoroughly reviewed with the patient. Ample time for discussion and questions allowed. The patient understood, was satisfied, and agreed to  proceed.  4.  Schedule surveillance colonoscopy with polypectomy if indicated.The nature of the procedure, as well as the risks, benefits, and alternatives were carefully and thoroughly reviewed with the patient. Ample time for discussion and questions allowed. The patient understood, was satisfied, and agreed to proceed.

## 2021-08-18 NOTE — Telephone Encounter (Signed)
Patient saw Dr. Henrene Pastor this morning.  She called this afternoon to schedule her endo/colon, which is scheduled for 10/20/21 at 9:00 a.m.  Please send instructions for prep.  Thank you.

## 2021-08-19 ENCOUNTER — Other Ambulatory Visit (HOSPITAL_COMMUNITY): Payer: Self-pay

## 2021-08-19 MED ORDER — PLENVU 140 G PO SOLR
1.0000 | Freq: Once | ORAL | 0 refills | Status: AC
  Filled 2021-08-19: qty 3, 1d supply, fill #0

## 2021-08-19 NOTE — Telephone Encounter (Signed)
Mailed instructions for patient's procedure.

## 2021-08-25 ENCOUNTER — Other Ambulatory Visit (HOSPITAL_COMMUNITY): Payer: Self-pay

## 2021-08-27 ENCOUNTER — Other Ambulatory Visit (HOSPITAL_COMMUNITY): Payer: Self-pay

## 2021-09-03 ENCOUNTER — Telehealth: Payer: Self-pay | Admitting: Internal Medicine

## 2021-09-03 ENCOUNTER — Other Ambulatory Visit (HOSPITAL_COMMUNITY): Payer: Self-pay

## 2021-09-03 NOTE — Telephone Encounter (Signed)
Inbound call from patient stated that she was told to call her insurance about a pre authorization, her insurance stated that we needed to do that. Patient just wanted to inform you. Please advise.    ?

## 2021-09-03 NOTE — Telephone Encounter (Signed)
PA for twice a day Nexium submitted to cover my meds.  Sent patient a mychart message to let her know I was awaiting a response.Marland Kitchen  ?

## 2021-09-09 ENCOUNTER — Other Ambulatory Visit (HOSPITAL_COMMUNITY): Payer: Self-pay

## 2021-09-09 NOTE — Telephone Encounter (Signed)
Brand Name Nexium was not approved but patient was able to get generic shipped to her ?

## 2021-09-11 ENCOUNTER — Other Ambulatory Visit (HOSPITAL_COMMUNITY): Payer: Self-pay

## 2021-10-13 ENCOUNTER — Encounter: Payer: 59 | Admitting: Internal Medicine

## 2021-10-14 ENCOUNTER — Encounter: Payer: Self-pay | Admitting: Internal Medicine

## 2021-10-20 ENCOUNTER — Encounter: Payer: Self-pay | Admitting: Internal Medicine

## 2021-10-20 ENCOUNTER — Ambulatory Visit (AMBULATORY_SURGERY_CENTER): Payer: HMO | Admitting: Internal Medicine

## 2021-10-20 VITALS — BP 151/85 | HR 63 | Temp 97.7°F | Resp 17 | Ht 66.0 in | Wt 214.0 lb

## 2021-10-20 DIAGNOSIS — D128 Benign neoplasm of rectum: Secondary | ICD-10-CM

## 2021-10-20 DIAGNOSIS — D129 Benign neoplasm of anus and anal canal: Secondary | ICD-10-CM | POA: Diagnosis not present

## 2021-10-20 DIAGNOSIS — D124 Benign neoplasm of descending colon: Secondary | ICD-10-CM | POA: Diagnosis not present

## 2021-10-20 DIAGNOSIS — Z86010 Personal history of colonic polyps: Secondary | ICD-10-CM

## 2021-10-20 DIAGNOSIS — R1310 Dysphagia, unspecified: Secondary | ICD-10-CM

## 2021-10-20 DIAGNOSIS — D127 Benign neoplasm of rectosigmoid junction: Secondary | ICD-10-CM | POA: Diagnosis not present

## 2021-10-20 DIAGNOSIS — K222 Esophageal obstruction: Secondary | ICD-10-CM

## 2021-10-20 DIAGNOSIS — I10 Essential (primary) hypertension: Secondary | ICD-10-CM | POA: Diagnosis not present

## 2021-10-20 DIAGNOSIS — K219 Gastro-esophageal reflux disease without esophagitis: Secondary | ICD-10-CM

## 2021-10-20 DIAGNOSIS — D125 Benign neoplasm of sigmoid colon: Secondary | ICD-10-CM

## 2021-10-20 MED ORDER — SODIUM CHLORIDE 0.9 % IV SOLN: 500.0000 mL | INTRAVENOUS | Status: DC

## 2021-10-20 NOTE — Op Note (Signed)
[facility] ?Patient Name: Janice Rodriguez ?Procedure Date: 10/20/2021 9:16 AM ?MRN: [id number] ?Endoscopist: Docia Chuck. Henrene Pastor , MD ?Age: 66 ?Referring MD:  ?Date of Birth: [birth year] ?Gender: Female ?Account #: [id number] ?Procedure:                Upper GI endoscopy with Maloney dilation of the  ?                          esophagus. [address] [place] ?Indications:              Dysphagia, Esophageal reflux ?Medicines:                Monitored Anesthesia Care ?Procedure:                Pre-Anesthesia Assessment: ?                          - Prior to the procedure, a History and Physical  ?                          was performed, and patient medications and  ?                          allergies were reviewed. The patient's tolerance of  ?                          previous anesthesia was also reviewed. The risks  ?                          and benefits of the procedure and the sedation  ?                          options and risks were discussed with the patient.  ?                          All questions were answered, and informed consent  ?                          was obtained. Prior Anticoagulants: The patient has  ?                          taken no previous anticoagulant or antiplatelet  ?                          agents. ASA Grade Assessment: II - A patient with  ?                          mild systemic disease. After reviewing the risks  ?                          and benefits, the patient was deemed in  ?                          satisfactory condition to undergo the procedure. ?  After obtaining informed consent, the endoscope was  ?                          passed under direct vision. Throughout the  ?                          procedure, the patient's blood pressure, pulse, and  ?                          oxygen saturations were monitored continuously. The  ?                          GIF [id number] #[id number] was introduced through the  ?                          mouth, and advanced to  the second part of duodenum.  ?                          The upper GI endoscopy was accomplished without  ?                          difficulty. The patient tolerated the procedure  ?                          well. ?Scope In: ?Scope Out: ?Findings:                 One benign-appearing, intrinsic moderate stenosis  ?                          was found 35 cm from the incisors. This stenosis  ?                          measured 1.5 cm (inner diameter). The scope was  ?                          withdrawn. Dilation was performed with a Venia Minks  ?                          dilator with no resistance at 76 Fr. ?                          The exam of the esophagus was otherwise normal. ?                          The stomach was normal. ?                          The examined duodenum was normal. ?                          The cardia and gastric fundus were normal on  ?                          retroflexion. ?Complications:  No immediate complications. ?Estimated Blood Loss:     Estimated blood loss: none. ?Impression:               - Benign-appearing esophageal stenosis. Dilated. ?                          - Normal stomach. Hiatal hernia. ?                          - Normal examined duodenum. ?                          - No specimens collected. ?Recommendation:           - Patient has a contact number available for  ?                          emergencies. The signs and symptoms of potential  ?                          delayed complications were discussed with the  ?                          patient. Return to normal activities tomorrow.  ?                          Written discharge instructions were provided to the  ?                          patient. ?                          -Post dilation diet. ?                          - Continue present medications. ?                          - Await pathology results. ?                          ??? Routine office follow-up 1 year ?Docia Chuck. Henrene Pastor, MD ?10/20/2021 10:12:00  AM ?This report has been signed electronically. ?

## 2021-10-20 NOTE — Progress Notes (Signed)
HISTORY OF PRESENT ILLNESS: ? ?Janice Rodriguez is a 66 y.o. female who was seen in the office August 18, 2021.  See that complete H&P.  She presents for colonoscopy and upper endoscopy.  No interval changes ? ?REVIEW OF SYSTEMS: ? ?All non-GI ROS negative except for ? ?Past Medical History:  ?Diagnosis Date  ? ASYMPTOMATIC POSTMENOPAUSAL STATUS   ? Blood transfusion without reported diagnosis 1980's  ? Female cystocele   ? Gall stones, common bile duct   ? dialated bile duct per [place] called today 11-13-2018  ? GERD   ? History of irregular heartbeat   ? HYPERLIPIDEMIA   ? HYPERTENSION   ? Left atrial enlargement 12/2013  ? Noted on ECHO  ? LVH (left ventricular hypertrophy) 12/2013  ? Mild, noted on ECHO  ? OBESITY   ? SCOLIOSIS   ? Tuberculosis 2014  ? Positive quantiferon TB gold test, CXR: No definite evidence to suggest active intrathoracic tuberculosis   ? UMBILICAL HERNIORRHAPHY, HX OF   ? age 13  ? Vitamin D deficiency   ? Wears glasses   ? ? ?Past Surgical History:  ?Procedure Laterality Date  ? BILIARY STENT PLACEMENT  11/30/2018  ? Procedure: BILIARY STENT PLACEMENT;  Surgeon: Ladene Artist, MD;  Location: [facility];  Service: Endoscopy;;  ? CHOLECYSTECTOMY N/A 11/26/2018  ? Procedure: LAPAROSCOPIC CHOLECYSTECTOMY WITH INTRAOPERATIVE CHOLANGIOGRAM;  Surgeon: Rolm Bookbinder, MD;  Location: [facility];  Service: General;  Laterality: N/A;  ? COLONOSCOPY  2012  ? [place]ics  ? COLONOSCOPY  10/2015  ? [place]  ? ENDOSCOPIC RETROGRADE CHOLANGIOPANCREATOGRAPHY (ERCP) WITH PROPOFOL N/A 11/17/2018  ? Procedure: ENDOSCOPIC RETROGRADE CHOLANGIOPANCREATOGRAPHY (ERCP) WITH PROPOFOL;  Surgeon: Irene Shipper, MD;  Location: [facility];  Service: Endoscopy;  Laterality: N/A;  ? ENDOSCOPIC RETROGRADE CHOLANGIOPANCREATOGRAPHY (ERCP) WITH PROPOFOL N/A 11/30/2018  ? Procedure: ENDOSCOPIC RETROGRADE CHOLANGIOPANCREATOGRAPHY (ERCP) WITH PROPOFOL;  Surgeon: Ladene Artist, MD;  Location[facility];   Service: Endoscopy;  Laterality: N/A;  ? ERCP N/A 01/22/2019  ? Procedure: ENDOSCOPIC RETROGRADE CHOLANGIOPANCREATOGRAPHY (ERCP);  Surgeon: Irene Shipper, MD;  Location: [facility];  Service: Endoscopy;  Laterality: N/A;  ? REMOVAL OF STONES  11/17/2018  ? Procedure: REMOVAL OF STONES;  Surgeon: Irene Shipper, MD;  Location: [facility];  Service: Endoscopy;;  ? REMOVAL OF STONES  11/30/2018  ? Procedure: REMOVAL OF STONES;  Surgeon: Ladene Artist, MD;  Location: [facility];  Service: Endoscopy;;  ? SPHINCTEROTOMY  11/17/2018  ? Procedure: SPHINCTEROTOMY;  Surgeon: Irene Shipper, MD;  Location: [facility];  Service: Endoscopy;;  ? [place]  ? UPPER GASTROINTESTINAL ENDOSCOPY  10/2015  ? VAGINAL HYSTERECTOMY  1992  ? ? ?Social History ?Alajia Schmelzer  reports that she has never smoked. She has never used smokeless tobacco. She reports current alcohol use. She reports that she does not use drugs. ? ?family history includes Breast cancer in her mother; Colon cancer in her father and paternal aunt; Diabetes in her brother and another family member; Diabetes (age of onset: 18) in her mother; Heart attack in her brother; Heart disease in her father; Hyperlipidemia in an other family member; Hypertension in her brother and another family member; Sarcoidosis in her brother; Stroke in her brother and father. ? ?No Known Allergies ? ?  ? ?PHYSICAL EXAMINATION: ? ?Vital signs: BP (!) 173/93   Pulse 68   Temp 97.7 ?F (36.5 ?C)   Resp 19   Ht '5\' 6"'$  (  1.676 m)   Wt 214 lb (97.1 kg)   SpO2 100%   BMI 34.54 kg/m?  ?General: Well-developed, well-nourished, no acute distress ?HEENT: Sclerae are anicteric, conjunctiva pink. Oral mucosa intact ?Lungs: Clear ?Heart: Regular ?Abdomen: soft, nontender, nondistended, no obvious ascites, no peritoneal signs, normal bowel sounds. No organomegaly. ?Extremities: No edema ?Psychiatric: alert and oriented x3. Cooperative  ? ? ? ?ASSESSMENT: ? ?1.  History of  adenomatous colon polyps.  Family history of colon cancer.  Due for colonoscopy ?2.  GERD with dysphagia. ? ? ?PLAN: ? ? ?Colonoscopy and upper endoscopy with probable esophageal dilation ? ? ? ?  ?

## 2021-10-20 NOTE — Patient Instructions (Addendum)
YOU HAD AN ENDOSCOPIC PROCEDURE TODAY AT [facility]:   Refer to the procedure report that was given to you for any specific questions about what was found during the examination.  If the procedure report does not answer your questions, please call your gastroenterologist to clarify.  If you requested that your care partner not be given the details of your procedure findings, then the procedure report has been included in a sealed envelope for you to review at your convenience later. ? ?YOU SHOULD EXPECT: Some feelings of bloating in the abdomen. Passage of more gas than usual.  Walking can help get rid of the air that was put into your GI tract during the procedure and reduce the bloating. If you had a lower endoscopy (such as a colonoscopy or flexible sigmoidoscopy) you may notice spotting of blood in your stool or on the toilet paper. If you underwent a bowel prep for your procedure, you may not have a normal bowel movement for a few days. ? ?Please Note:  You might notice some irritation and congestion in your nose or some drainage.  This is from the oxygen used during your procedure.  There is no need for concern and it should clear up in a day or so. ? ?SYMPTOMS TO REPORT IMMEDIATELY: ? ?Following lower endoscopy (colonoscopy or flexible sigmoidoscopy): ? Excessive amounts of blood in the stool ? Significant tenderness or worsening of abdominal pains ? Swelling of the abdomen that is new, acute ? Fever of 100?F or higher ? ?Following upper endoscopy (EGD) ? Vomiting of blood or coffee ground material ? New chest pain or pain under the shoulder blades ? Painful or persistently difficult swallowing ? New shortness of breath ? Fever of 100?F or higher ? Black, tarry-looking stools ? ?For urgent or emergent issues, a gastroenterologist can be reached at any hour by calling [phone]. ?Do not use MyChart messaging for urgent concerns.  ? ? ?DIET:  Dilation diet today (see handout), but then  you may proceed to your regular diet tomorrow.  Drink plenty of fluids but you should avoid alcoholic beverages for 24 hours. ? ?ACTIVITY:  You should plan to take it easy for the rest of today and you should NOT DRIVE or use heavy machinery until tomorrow (because of the sedation medicines used during the test).   ? ?FOLLOW UP: ?Our staff will call the number listed on your records 48-72 hours following your procedure to check on you and address any questions or concerns that you may have regarding the information given to you following your procedure. If we do not reach you, we will leave a message.  We will attempt to reach you two times.  During this call, we will ask if you have developed any symptoms of COVID 19. If you develop any symptoms (ie: fever, flu-like symptoms, shortness of breath, cough etc.) before then, please call [phone].  If you test positive for Covid 19 in the 2 weeks post procedure, please call and report this information to [place].   ? ?If any biopsies were taken you will be contacted by phone or by letter within the next 1-3 weeks.  Please call us at [phone] if you have not heard about the biopsies in 3 weeks.  ? ? ?SIGNATURES/CONFIDENTIALITY: ?You and/or your care partner have signed paperwork which will be entered into your electronic medical record.  These signatures attest to the fact that that the information above on your After Visit Summary  has been reviewed and is understood.  Full responsibility of the confidentiality of this discharge information lies with you and/or your care-partner.  ?

## 2021-10-20 NOTE — Progress Notes (Signed)
Report to PACU, RN, vss, BBS= Clear.  

## 2021-10-20 NOTE — Progress Notes (Signed)
Called to room to assist during endoscopic procedure.  Patient ID and intended procedure confirmed with present staff. Received instructions for my participation in the procedure from the performing physician.  

## 2021-10-20 NOTE — Op Note (Signed)
[facility] ?Patient Name: Janice Rodriguez ?Procedure Date: 10/20/2021 9:17 AM ?MRN: [id number] ?Endoscopist: Docia Chuck. Henrene Pastor , MD ?Age: 66 ?Referring MD:  ?Date of Birth: 1955-11-09 ?Gender: Female ?Account #: [id number] ?Procedure:                Colonoscopy with cold polypectomy x 4 ?Indications:              High risk colon cancer surveillance: Personal  ?                          history of non-advanced adenomas. Previous  ?                          examinations 2012, 2017. Also, parent with a  ?                          history of colon cancer in 31s ?Medicines:                Monitored Anesthesia Care ?Procedure:                Pre-Anesthesia Assessment: ?                          - Prior to the procedure, a History and Physical  ?                          was performed, and patient medications and  ?                          allergies were reviewed. The patient's tolerance of  ?                          previous anesthesia was also reviewed. The risks  ?                          and benefits of the procedure and the sedation  ?                          options and risks were discussed with the patient.  ?                          All questions were answered, and informed consent  ?                          was obtained. Prior Anticoagulants: The patient has  ?                          taken no previous anticoagulant or antiplatelet  ?                          agents. After reviewing the risks and benefits, the  ?                          patient was deemed in satisfactory condition to  ?  undergo the procedure. ?                          After obtaining informed consent, the colonoscope  ?                          was passed under direct vision. Throughout the  ?                          procedure, the patient's blood pressure, pulse, and  ?                          oxygen saturations were monitored continuously. The  ?                          CF [id number] #[id number] was  introduced through the anus  ?                          and advanced to the the cecum, identified by  ?                          appendiceal orifice and ileocecal valve. The  ?                          ileocecal valve, appendiceal orifice, and rectum  ?                          were photographed. The quality of the bowel  ?                          preparation was good. The colonoscopy was performed  ?                          without difficulty. The patient tolerated the  ?                          procedure well. The bowel preparation used was  ?                          Plenvu via split dose instruction. ?Scope In: 9:33:50 AM ?Scope Out: 9:48:50 AM ?Scope Withdrawal Time: 0 hours 11 minutes 48 seconds  ?Total Procedure Duration: 0 hours 15 minutes 0 seconds  ?Findings:                 Four polyps were found in the rectum, sigmoid colon  ?                          and descending colon. The polyps were 2 to 3 mm in  ?                          size. These polyps were removed with a cold snare.  ?                          Resection and retrieval were complete. ?  Multiple diverticula were found in the entire colon. ?                          The exam was otherwise without abnormality on  ?                          direct and retroflexion views. ?Complications:            No immediate complications. Estimated blood loss:  ?                          None. ?Estimated Blood Loss:     Estimated blood loss: none. ?Impression:               - Four 2 to 3 mm polyps in the rectum, in the  ?                          sigmoid colon and in the descending colon, removed  ?                          with a cold snare. Resected and retrieved. ?                          - Diverticulosis in the entire examined colon. ?                          - The examination was otherwise normal on direct  ?                          and retroflexion views. ?Recommendation:           - Repeat colonoscopy in 5 years for  surveillance. ?                          - Patient has a contact number available for  ?                          emergencies. The signs and symptoms of potential  ?                          delayed complications were discussed with the  ?                          patient. Return to normal activities tomorrow.  ?                          Written discharge instructions were provided to the  ?                          patient. ?                          - Resume previous diet. ?                          - Continue present medications. ?                          -  Await pathology results. ?Docia Chuck. Henrene Pastor, MD ?10/20/2021 10:09:05 AM ?This report has been signed electronically. ?

## 2021-10-22 ENCOUNTER — Telehealth: Payer: Self-pay

## 2021-10-22 ENCOUNTER — Encounter: Payer: Self-pay | Admitting: Internal Medicine

## 2021-10-22 NOTE — Telephone Encounter (Signed)
?  Follow up Call- ? ? ?  10/20/2021  ?  8:13 AM  ?Call back number  ?Post procedure Call Back phone  # [phone]  ?Permission to leave phone message Yes  ?  ? ?Patient questions: ? ?Do you have a fever, pain , or abdominal swelling? No. ?Pain Score  0 * ? ?Have you tolerated food without any problems? Yes.   ? ?Have you been able to return to your normal activities? Yes.   ? ?Do you have any questions about your discharge instructions: ?Diet   No. ?Medications  No. ?Follow up visit  No. ? ?Do you have questions or concerns about your Care? No. ? ?Actions: ?* If pain score is 4 or above: ?No action needed, pain <4. ? ? ?

## 2021-11-02 ENCOUNTER — Other Ambulatory Visit: Payer: Self-pay | Admitting: Interventional Cardiology

## 2021-11-02 ENCOUNTER — Other Ambulatory Visit (HOSPITAL_COMMUNITY): Payer: Self-pay

## 2021-11-02 ENCOUNTER — Other Ambulatory Visit: Payer: Self-pay | Admitting: Internal Medicine

## 2021-11-02 MED ORDER — HYDROCHLOROTHIAZIDE 12.5 MG PO CAPS
12.5000 mg | ORAL_CAPSULE | Freq: Every morning | ORAL | 0 refills | Status: DC
  Filled 2021-11-02: qty 30, 30d supply, fill #0

## 2021-11-02 MED ORDER — ESOMEPRAZOLE MAGNESIUM 40 MG PO CPDR
40.0000 mg | DELAYED_RELEASE_CAPSULE | Freq: Two times a day (BID) | ORAL | 3 refills | Status: DC
  Filled 2021-11-02: qty 60, 30d supply, fill #0
  Filled 2021-12-31: qty 60, 30d supply, fill #1
  Filled 2022-03-09: qty 60, 30d supply, fill #2
  Filled 2022-05-05: qty 60, 30d supply, fill #3

## 2021-11-09 ENCOUNTER — Encounter: Payer: Self-pay | Admitting: Hematology & Oncology

## 2021-11-09 ENCOUNTER — Other Ambulatory Visit: Payer: Self-pay | Admitting: Interventional Cardiology

## 2021-11-09 ENCOUNTER — Inpatient Hospital Stay: Payer: PPO | Attending: Hematology & Oncology

## 2021-11-09 ENCOUNTER — Other Ambulatory Visit (HOSPITAL_COMMUNITY): Payer: Self-pay

## 2021-11-09 ENCOUNTER — Inpatient Hospital Stay (HOSPITAL_BASED_OUTPATIENT_CLINIC_OR_DEPARTMENT_OTHER): Payer: PPO | Admitting: Hematology & Oncology

## 2021-11-09 VITALS — BP 157/82 | HR 68 | Temp 97.5°F | Resp 20 | Ht 66.0 in | Wt 213.4 lb

## 2021-11-09 DIAGNOSIS — R233 Spontaneous ecchymoses: Secondary | ICD-10-CM

## 2021-11-09 DIAGNOSIS — Z803 Family history of malignant neoplasm of breast: Secondary | ICD-10-CM | POA: Diagnosis not present

## 2021-11-09 DIAGNOSIS — Z800 Family history of malignant neoplasm of digestive organs: Secondary | ICD-10-CM | POA: Insufficient documentation

## 2021-11-09 DIAGNOSIS — M419 Scoliosis, unspecified: Secondary | ICD-10-CM | POA: Insufficient documentation

## 2021-11-09 DIAGNOSIS — I10 Essential (primary) hypertension: Secondary | ICD-10-CM | POA: Diagnosis not present

## 2021-11-09 DIAGNOSIS — R58 Hemorrhage, not elsewhere classified: Secondary | ICD-10-CM | POA: Insufficient documentation

## 2021-11-09 DIAGNOSIS — Z9049 Acquired absence of other specified parts of digestive tract: Secondary | ICD-10-CM | POA: Insufficient documentation

## 2021-11-09 LAB — CBC WITH DIFFERENTIAL (CANCER CENTER ONLY)
Abs Immature Granulocytes: 0.05 10*3/uL (ref 0.00–0.07)
Basophils Absolute: 0 10*3/uL (ref 0.0–0.1)
Basophils Relative: 1 %
Eosinophils Absolute: 0.2 10*3/uL (ref 0.0–0.5)
Eosinophils Relative: 3 %
HCT: 37.6 % (ref 36.0–46.0)
Hemoglobin: 12.1 g/dL (ref 12.0–15.0)
Immature Granulocytes: 1 %
Lymphocytes Relative: 40 %
Lymphs Abs: 2.9 10*3/uL (ref 0.7–4.0)
MCH: 26.8 pg (ref 26.0–34.0)
MCHC: 32.2 g/dL (ref 30.0–36.0)
MCV: 83.4 fL (ref 80.0–100.0)
Monocytes Absolute: 0.6 10*3/uL (ref 0.1–1.0)
Monocytes Relative: 8 %
Neutro Abs: 3.4 10*3/uL (ref 1.7–7.7)
Neutrophils Relative %: 47 %
Platelet Count: 259 10*3/uL (ref 150–400)
RBC: 4.51 MIL/uL (ref 3.87–5.11)
RDW: 16 % — ABNORMAL HIGH (ref 11.5–15.5)
WBC Count: 7.3 10*3/uL (ref 4.0–10.5)
nRBC: 0 % (ref 0.0–0.2)

## 2021-11-09 LAB — CMP (CANCER CENTER ONLY)
ALT: 9 U/L (ref 0–44)
AST: 14 U/L — ABNORMAL LOW (ref 15–41)
Albumin: 4 g/dL (ref 3.5–5.0)
Alkaline Phosphatase: 96 U/L (ref 38–126)
Anion gap: 8 (ref 5–15)
BUN: 11 mg/dL (ref 8–23)
CO2: 30 mmol/L (ref 22–32)
Calcium: 9.2 mg/dL (ref 8.9–10.3)
Chloride: 103 mmol/L (ref 98–111)
Creatinine: 0.9 mg/dL (ref 0.44–1.00)
GFR, Estimated: >60 mL/min (ref >60)
Glucose, Bld: 90 mg/dL (ref 70–99)
Potassium: 4.2 mmol/L (ref 3.5–5.1)
Sodium: 141 mmol/L (ref 135–145)
Total Bilirubin: 0.4 mg/dL (ref 0.3–1.2)
Total Protein: 7 g/dL (ref 6.5–8.1)

## 2021-11-09 LAB — PROTIME-INR
INR: 1 (ref 0.8–1.2)
Prothrombin Time: 12.6 seconds (ref 11.4–15.2)

## 2021-11-09 LAB — APTT: aPTT: 28 seconds (ref 24–36)

## 2021-11-09 LAB — SAVE SMEAR(SSMR), FOR PROVIDER SLIDE REVIEW

## 2021-11-09 MED ORDER — METOPROLOL SUCCINATE ER 50 MG PO TB24
75.0000 mg | ORAL_TABLET | Freq: Every day | ORAL | 0 refills | Status: DC
  Filled 2021-11-09: qty 135, 90d supply, fill #0

## 2021-11-09 NOTE — Progress Notes (Signed)
Referral MD ? ?Reason for Referral: Easy bruising ? ?Chief Complaint  ?Patient presents with  ? New Patient (Initial Visit)  ?  Bruising.  ?: I have been having bruises for several years. ? ?HPI: [name] is a very nice 66 year old Afro-American female.  She actually works for [employer].  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 ? ?She does have several health issues.  She has scoliosis.  She had her gallbladder removed back in 2020.  This is actually during the pandemic. ? ?She says that she has had problems with bruising.  This been going on for several years.  That happen before the pandemic.  She has had no bleeding. ? ?When she had her gallbladder repair, there was no problems with bleeding. ? ?She has had no bleeding.  There is no one in the family that has any problems with bleeding. ? ?She does not take aspirin or nonsteroidals right now.  She was taking baby aspirin daily.  She stopped this in November. ? ?She does not take any supplements.  Particularly, she does not take any garlic. ? ?She has had no problems of weight loss or weight gain.  I think she has little bit of high blood pressure.   ? ?There has been no problems with smoking.  She has rare alcohol use. ? ?She was currently referred to the [facility] because of this bruising. ? ?Overall, I would say her performance status is probably ECOG 0. ? ?Past Medical History:  ?Diagnosis Date  ? ASYMPTOMATIC POSTMENOPAUSAL STATUS   ? Blood transfusion without reported diagnosis 1980's  ? Female cystocele   ? Gall stones, common bile duct   ? dialated bile duct per [place] called today 11-13-2018  ? GERD   ? History of irregular heartbeat   ? HYPERLIPIDEMIA   ? HYPERTENSION   ? Left atrial enlargement 12/2013  ? Noted on ECHO  ? LVH (left ventricular hypertrophy) 12/2013  ? Mild, noted on ECHO  ? OBESITY   ? SCOLIOSIS   ? Tuberculosis 2014  ? Positive quantiferon TB gold test, CXR: No definite evidence to suggest active intrathoracic  tuberculosis   ? UMBILICAL HERNIORRHAPHY, HX OF   ? age 16  ? Vitamin D deficiency   ? Wears glasses   ?: ? ? ?Past Surgical History:  ?Procedure Laterality Date  ? BILIARY STENT PLACEMENT  11/30/2018  ? Procedure: BILIARY STENT PLACEMENT;  Surgeon: Ladene Artist, MD;  Location: [facility];  Service: Endoscopy;;  ? CHOLECYSTECTOMY N/A 11/26/2018  ? Procedure: LAPAROSCOPIC CHOLECYSTECTOMY WITH INTRAOPERATIVE CHOLANGIOGRAM;  Surgeon: Rolm Bookbinder, MD;  Location: [facility];  Service: General;  Laterality: N/A;  ? COLONOSCOPY  2012  ? Tics  ? COLONOSCOPY  10/2015  ? [place]  ? ENDOSCOPIC RETROGRADE CHOLANGIOPANCREATOGRAPHY (ERCP) WITH PROPOFOL N/A 11/17/2018  ? Procedure: ENDOSCOPIC RETROGRADE CHOLANGIOPANCREATOGRAPHY (ERCP) WITH PROPOFOL;  Surgeon: Irene Shipper, MD;  Location[facility];  Service: Endoscopy;  Laterality: N/A;  ? ENDOSCOPIC RETROGRADE CHOLANGIOPANCREATOGRAPHY (ERCP) WITH PROPOFOL N/A 11/30/2018  ? Procedure: ENDOSCOPIC RETROGRADE CHOLANGIOPANCREATOGRAPHY (ERCP) WITH PROPOFOL;  Surgeon: Ladene Artist, MD;  Location: [facility];  Service: Endoscopy;  Laterality: N/A;  ? ERCP N/A 01/22/2019  ? Procedure: ENDOSCOPIC RETROGRADE CHOLANGIOPANCREATOGRAPHY (ERCP);  Surgeon: Irene Shipper, MD;  Location: [facility];  Service: Endoscopy;  Laterality: N/A;  ? REMOVAL OF STONES  11/17/2018  ? Procedure: REMOVAL OF STONES;  Surgeon: Irene Shipper, MD;  Location: [facility];  Service: Endoscopy;;  ? REMOVAL OF STONES  11/30/2018  ? Procedure: REMOVAL OF STONES;  Surgeon: Ladene Artist, MD;  Location: [facility];  Service: Endoscopy;;  ? SPHINCTEROTOMY  11/17/2018  ? Procedure: SPHINCTEROTOMY;  Surgeon: Irene Shipper, MD;  Location: [facility];  Service: Endoscopy;;  ? [place]  ? UPPER GASTROINTESTINAL ENDOSCOPY  10/2015  ? VAGINAL HYSTERECTOMY  1992  ?: ? ? ?Current Outpatient Medications:  ?  acetaminophen (TYLENOL) 500 MG tablet, Take 1,000 mg by mouth  every 6 (six) hours as needed (pain)., Disp: , Rfl:  ?  candesartan (ATACAND) 16 MG tablet, TAKE 1 TABLET BY MOUTH ONCE A DAY. MUST KEEP APPT IN MAY 2022 FOR FUTHER REFILLS., Disp: 90 tablet, Rfl: 3 ?  cholecalciferol (VITAMIN D3) 25 MCG (1000 UT) tablet, Take 1,000 Units by mouth daily., Disp: , Rfl:  ?  esomeprazole (NEXIUM) 40 MG capsule, Take 1 capsule (40 mg total) by mouth 2 (two) times daily before a meal. Office visit for further refills (Patient taking differently: Take 40 mg by mouth daily. Office visit for further refills), Disp: 60 capsule, Rfl: 3 ?  hydrochlorothiazide (MICROZIDE) 12.5 MG capsule, Take 1 capsule by mouth in the morning. Please schedule yearly appointment for future refills. 1st attempt. Thank you, Disp: 30 capsule, Rfl: 0 ?  potassium chloride SA (KLOR-CON M) 20 MEQ tablet, Take 1 tablet (20 mEq total) by mouth daily., Disp: 90 tablet, Rfl: 3 ?  vitamin C (ASCORBIC ACID) 500 MG tablet, Take 500 mg by mouth daily., Disp: , Rfl:  ?  metoprolol succinate (TOPROL-XL) 50 MG 24 hr tablet, Take 1 and 1/2  tablets by mouth daily. Needs to call and schedule an appointment for further refills 1st attempt, Disp: 135 tablet, Rfl: 0 ?  Probiotic Product (PROBIOTIC DAILY PO), Take 1 capsule by mouth daily as needed (upset stomach).  (Patient not taking: Reported on 11/09/2021), Disp: , Rfl: : ? ?: ? ?No Known Allergies: ? ? ?Family History  ?Problem Relation Age of Onset  ? Breast cancer Mother   ? Diabetes Mother 54  ? Stroke Father   ? Heart disease Father   ? Colon cancer Father   ?     not sure of age dx  ? Stroke Brother   ? Diabetes Brother   ? Sarcoidosis Brother   ? Heart attack Brother   ? Hypertension Brother   ? Colon cancer Paternal Aunt   ? Diabetes Other   ? Hypertension Other   ? Hyperlipidemia Other   ? Esophageal cancer Neg Hx   ? Pancreatic cancer Neg Hx   ? Rectal cancer Neg Hx   ? Stomach cancer Neg Hx   ? Colon polyps Neg Hx   ?: ? ? ?Social History  ? ?Socioeconomic History  ?  Marital status: Married  ?  Spouse name: Purcell Nails  ? Number of children: Not on file  ? Years of education: Not on file  ? Highest education level: Not on file  ?Occupational History  ? Occupation: Therapist, sports- Chiropractor  ?  Employer: [employer]  ?Tobacco Use  ? Smoking status: Never  ? Smokeless tobacco: Never  ?Vaping Use  ? Vaping Use: Never used  ?Substance and Sexual Activity  ? Alcohol use: Yes  ?  Alcohol/week: 0.0 standard drinks  ?  Comment: occas wine  ? Drug use: No  ? Sexual activity: Not on file  ?Other Topics Concern  ?  Not on file  ?Social History Narrative  ? Married, lives with spouse and occ kid when back at home. 3 grown kids. 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 ?Social Determinants of Health  ? ?Financial Resource Strain: Not on file  ?Food Insecurity: Not on file  ?Transportation Needs: Not on file  ?Physical Activity: Not on file  ?Stress: Not on file  ?Social Connections: Not on file  ?Intimate Partner Violence: Not on file  ?: ? ?Review of Systems  ?Constitutional: Negative.   ?HENT: Negative.    ?Eyes: Negative.   ?Respiratory: Negative.    ?Cardiovascular: Negative.   ?Gastrointestinal: Negative.   ?Genitourinary: Negative.   ?Musculoskeletal: Negative.   ?Skin: Negative.   ?Neurological: Negative.   ?Endo/Heme/Allergies:  Bruises/bleeds easily.  ?Psychiatric/Behavioral: Negative.    ? ? ?Exam: ?'@IPVITALS'$ @ ?Physical Exam ?Vitals reviewed.  ?HENT:  ?   Head: Normocephalic and atraumatic.  ?Eyes:  ?   Pupils: Pupils are equal, round, and reactive to light.  ?Cardiovascular:  ?   Rate and Rhythm: Normal rate and regular rhythm.  ?   Heart sounds: Normal heart sounds.  ?Pulmonary:  ?   Effort: Pulmonary effort is normal.  ?   Breath sounds: Normal breath sounds.  ?Abdominal:  ?   General: Bowel sounds are normal.  ?   Palpations: Abdomen is soft.  ?Musculoskeletal:     ?   General: No tenderness or deformity. Normal range of motion.  ?   Cervical back: Normal range of  motion.  ?Lymphadenopathy:  ?   Cervical: No cervical adenopathy.  ?Skin: ?   General: Skin is warm and dry.  ?   Findings: Bruising present. No erythema or rash.  ?   Comments: Skin exam does show some scatt

## 2021-11-10 ENCOUNTER — Telehealth: Payer: Self-pay

## 2021-11-10 NOTE — Telephone Encounter (Signed)
Called and informed patient of lab results, patient verbalized understanding and denies any questions or concerns at this time. Lab only apt scheduled for 11/17/21 @ 11:15 AM.  ? ?

## 2021-11-10 NOTE — Telephone Encounter (Signed)
-----   Message from Volanda Napoleon, MD sent at 11/09/2021  4:17 PM EDT ----- ?Call - the protime and PTT are normal.  We need for you to come back in for platelet function labs.  Pete ?

## 2021-11-11 LAB — VON WILLEBRAND PANEL
Coagulation Factor VIII: 149 % — ABNORMAL HIGH (ref 56–140)
Ristocetin Co-factor, Plasma: 129 % (ref 50–200)
Von Willebrand Antigen, Plasma: 175 % (ref 50–200)

## 2021-11-11 LAB — COAG STUDIES INTERP REPORT

## 2021-11-13 ENCOUNTER — Other Ambulatory Visit (HOSPITAL_COMMUNITY): Payer: Self-pay

## 2021-11-13 ENCOUNTER — Other Ambulatory Visit: Payer: Self-pay | Admitting: Interventional Cardiology

## 2021-11-16 ENCOUNTER — Other Ambulatory Visit: Payer: Self-pay

## 2021-11-16 ENCOUNTER — Other Ambulatory Visit (HOSPITAL_COMMUNITY): Payer: Self-pay

## 2021-11-16 DIAGNOSIS — R233 Spontaneous ecchymoses: Secondary | ICD-10-CM

## 2021-11-16 MED ORDER — POTASSIUM CHLORIDE CRYS ER 20 MEQ PO TBCR
20.0000 meq | EXTENDED_RELEASE_TABLET | Freq: Every day | ORAL | 0 refills | Status: DC
  Filled 2021-11-16: qty 90, 90d supply, fill #0

## 2021-11-17 ENCOUNTER — Inpatient Hospital Stay: Payer: PPO

## 2021-11-17 DIAGNOSIS — R58 Hemorrhage, not elsewhere classified: Secondary | ICD-10-CM | POA: Diagnosis not present

## 2021-11-17 DIAGNOSIS — R233 Spontaneous ecchymoses: Secondary | ICD-10-CM

## 2021-11-17 LAB — PLATELET FUNCTION ASSAY: Collagen / Epinephrine: 147 seconds (ref 0–193)

## 2021-11-19 ENCOUNTER — Ambulatory Visit (INDEPENDENT_AMBULATORY_CARE_PROVIDER_SITE_OTHER): Payer: HMO

## 2021-11-19 ENCOUNTER — Encounter: Payer: Self-pay | Admitting: Podiatry

## 2021-11-19 ENCOUNTER — Ambulatory Visit (INDEPENDENT_AMBULATORY_CARE_PROVIDER_SITE_OTHER): Payer: HMO | Admitting: Podiatry

## 2021-11-19 DIAGNOSIS — M779 Enthesopathy, unspecified: Secondary | ICD-10-CM

## 2021-11-19 DIAGNOSIS — M76821 Posterior tibial tendinitis, right leg: Secondary | ICD-10-CM

## 2021-11-19 MED ORDER — TRIAMCINOLONE ACETONIDE 10 MG/ML IJ SUSP
10.0000 mg | Freq: Once | INTRAMUSCULAR | Status: AC
  Administered 2021-11-19: 10 mg

## 2021-11-19 NOTE — Progress Notes (Signed)
Subjective:   Patient ID: Janice Rodriguez, female   DOB: 66 y.o.   MRN: [id number]   HPI Patient presents stating she developed a lot of pain in her inside of her right ankle approximate 11-monthduration.  Patient does have moderate obesity flatfoot deformity that is been present for a number of years and does not smoke and tries to be active   Review of Systems  All other systems reviewed and are negative.      Objective:  Physical Exam Vitals and nursing note reviewed.  Constitutional:      Appearance: She is well-developed.  Pulmonary:     Effort: Pulmonary effort is normal.  Musculoskeletal:        General: Normal range of motion.  Skin:    General: Skin is warm.  Neurological:     Mental Status: She is alert.    Neurovascular status intact muscle strength adequate range of motion within normal limits with patient found to have flatfoot deformity right over left muscle strength testing of posterior tib indicates it does appear to be intact with either stretch or possible interstitial tear of the tendon with swelling of the medial ankle.  Patient has good digital perfusion well oriented x3     Assessment:  Acute posterior tibial tendinitis right with depression of the arch and inflammatory posterior tib with the possibility of interstitial tear     Plan:  H&P educated her on condition and x-rays that I saw.  At this point I have recommended a careful sheath injection explaining chances for rupture along with complete immobilization with air fracture walker and if this gets it better good custom orthotic devices and if not we will need to consider MRI with the possibility that there may be a tear of the tendon that will have to be repaired or possible flatfoot correction.  Today we did dispense air fracture walker it was placed on her lower leg with all instructions on usage and I want her wearing this all the time to due to the severe stress on the posterior tibial  tendon  X-rays indicate that there is severe flatfoot deformity right consistent with posterior tibial tendinitis

## 2021-11-24 ENCOUNTER — Other Ambulatory Visit: Payer: Self-pay | Admitting: Interventional Cardiology

## 2021-11-25 ENCOUNTER — Other Ambulatory Visit (HOSPITAL_COMMUNITY): Payer: Self-pay

## 2021-11-26 ENCOUNTER — Other Ambulatory Visit (HOSPITAL_COMMUNITY): Payer: Self-pay

## 2021-11-26 MED ORDER — HYDROCHLOROTHIAZIDE 12.5 MG PO CAPS
12.5000 mg | ORAL_CAPSULE | Freq: Every morning | ORAL | 0 refills | Status: DC
Start: 2021-11-26 — End: 2022-01-20
  Filled 2021-11-26: qty 90, 90d supply, fill #0

## 2021-11-26 MED ORDER — CANDESARTAN CILEXETIL 16 MG PO TABS
ORAL_TABLET | ORAL | 0 refills | Status: DC
  Filled 2021-11-26: qty 90, 90d supply, fill #0

## 2021-11-27 ENCOUNTER — Other Ambulatory Visit (HOSPITAL_COMMUNITY): Payer: Self-pay

## 2021-12-17 ENCOUNTER — Ambulatory Visit (INDEPENDENT_AMBULATORY_CARE_PROVIDER_SITE_OTHER): Payer: HMO | Admitting: Podiatry

## 2021-12-17 DIAGNOSIS — T148XXA Other injury of unspecified body region, initial encounter: Secondary | ICD-10-CM

## 2021-12-17 DIAGNOSIS — M76821 Posterior tibial tendinitis, right leg: Secondary | ICD-10-CM | POA: Diagnosis not present

## 2021-12-26 ENCOUNTER — Ambulatory Visit
Admission: RE | Admit: 2021-12-26 | Discharge: 2021-12-26 | Disposition: A | Discharge status: Home / Self Care | Payer: PPO | Source: Ambulatory Visit | Attending: Podiatry | Admitting: Podiatry

## 2021-12-26 DIAGNOSIS — R600 Localized edema: Secondary | ICD-10-CM | POA: Diagnosis not present

## 2021-12-26 DIAGNOSIS — M19071 Primary osteoarthritis, right ankle and foot: Secondary | ICD-10-CM | POA: Diagnosis not present

## 2021-12-26 DIAGNOSIS — T148XXA Other injury of unspecified body region, initial encounter: Secondary | ICD-10-CM

## 2021-12-31 ENCOUNTER — Telehealth: Payer: Self-pay | Admitting: Podiatry

## 2021-12-31 ENCOUNTER — Other Ambulatory Visit (HOSPITAL_COMMUNITY): Payer: Self-pay

## 2021-12-31 NOTE — Telephone Encounter (Signed)
Pt called inquiring about next steps in treatment after reviewing her MRI results on MyChart. I offered to schedule her an appt, but she stated she would call back once she got to her calendar.

## 2022-01-14 ENCOUNTER — Encounter: Payer: Self-pay | Admitting: Podiatry

## 2022-01-14 ENCOUNTER — Ambulatory Visit (INDEPENDENT_AMBULATORY_CARE_PROVIDER_SITE_OTHER): Payer: HMO | Admitting: Podiatry

## 2022-01-14 DIAGNOSIS — M76822 Posterior tibial tendinitis, left leg: Secondary | ICD-10-CM | POA: Diagnosis not present

## 2022-01-14 DIAGNOSIS — M76821 Posterior tibial tendinitis, right leg: Secondary | ICD-10-CM | POA: Diagnosis not present

## 2022-01-15 NOTE — Progress Notes (Signed)
Subjective:   Patient ID: Janice Rodriguez, female   DOB: 67 y.o.   MRN: [id number]   HPI Patient states that she is improved but she still is having pain to an intense nature wants to review MRI and see what else we can do   ROS      Objective:  Physical Exam  Neurovascular status intact muscle strength adequate with flatfoot deformity noted right over left with posterior tibial tendinitis right with MRI showing there is quite a bit of tendinosis and thickening of the posterior tib with arthritis of the talonavicular joint.  Patient's pain has reduced it still was present to a high degree but not to the same degree as previously and she is using her brace and her boot     Assessment:  Chronic posterior tibial tendinitis right moderate improvement still present     Plan:  Reviewed condition recommended the continuation of conservative care with utilization of orthotics to try to hold the arch up with patient casted for functional orthotic devices today in order to try to reduce the pressure occurring on the posterior tibial tendon.  Will be seen back when ready all other questions answered

## 2022-01-17 NOTE — Progress Notes (Signed)
Cardiology Office Note:    Date:  01/20/2022   ID:  Janice Rodriguez, DOB [birth year], MRN [id number]  PCP:  [name], DO  Cardiologist:  Sinclair Grooms, MD   Referring MD: [name], DO   Chief Complaint  Patient presents with   Coronary Artery Disease   Hypertension   Hyperlipidemia    History of Present Illness:    Janice Rodriguez is a 66 y.o. female with a hx of primary hypertension, hyperlipidemia, premature beats, and family history of premature atherosclerosis.   She is doing well.  She never followed up with a coronary calcium score that we discussed.  We have been trying to treat her lipids but she is not interested in statin or any other therapy.  Her mother was a patient of mine and has significant atherosclerosis as well as her father.  She is not currently getting enough physical activity because of a right foot injury.  She is committed to getting back to physical activity as tolerated.  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, CXR: No definite evidence to suggest active intrathoracic tuberculosis    UMBILICAL HERNIORRHAPHY, HX OF    age 47   Vitamin D deficiency    Wears glasses     Past Surgical History:  Procedure Laterality Date   BILIARY STENT PLACEMENT  11/30/2018   Procedure: BILIARY STENT PLACEMENT;  Surgeon: Ladene Artist, MD;  Location: [facility] ENDOSCOPY;  Service: Endoscopy;;   CHOLECYSTECTOMY N/A 11/26/2018   Procedure: LAPAROSCOPIC CHOLECYSTECTOMY WITH  INTRAOPERATIVE CHOLANGIOGRAM;  Surgeon: Rolm Bookbinder, MD;  Location: [place];  Service: General;  Laterality: N/A;   COLONOSCOPY  2012   Tics   COLONOSCOPY  10/2015   ECTOPIC PREGNANCY SURGERY  1983   ENDOSCOPIC RETROGRADE CHOLANGIOPANCREATOGRAPHY (ERCP) WITH PROPOFOL N/A 11/17/2018   Procedure: ENDOSCOPIC RETROGRADE CHOLANGIOPANCREATOGRAPHY (ERCP) WITH PROPOFOL;  Surgeon: Irene Shipper, MD;  Location: [facility] ENDOSCOPY;  Service: Endoscopy;  Laterality: N/A;   ENDOSCOPIC RETROGRADE CHOLANGIOPANCREATOGRAPHY (ERCP) WITH PROPOFOL N/A 11/30/2018   Procedure: ENDOSCOPIC RETROGRADE CHOLANGIOPANCREATOGRAPHY (ERCP) WITH PROPOFOL;  Surgeon: Ladene Artist, MD;  Location[facility] ENDOSCOPY;  Service: Endoscopy;  Laterality: N/A;   ERCP N/A 01/22/2019   Procedure: ENDOSCOPIC RETROGRADE CHOLANGIOPANCREATOGRAPHY (ERCP);  Surgeon: Irene Shipper, MD;  Location: [facility];  Service: Endoscopy;  Laterality: N/A;   REMOVAL OF STONES  11/17/2018   Procedure: REMOVAL OF STONES;  Surgeon: Irene Shipper, MD;  Location: [facility] ENDOSCOPY;  Service: Endoscopy;;   REMOVAL OF STONES  11/30/2018   Procedure: REMOVAL OF STONES;  Surgeon: Ladene Artist, MD;  Location: [facility] ENDOSCOPY;  Service: Endoscopy;;   SPHINCTEROTOMY  11/17/2018   Procedure: [name];  Surgeon: Irene Shipper, MD;  Location: [facility] ENDOSCOPY;  Service: Endoscopy;;   [place]   UPPER GASTROINTESTINAL ENDOSCOPY  10/2015   VAGINAL HYSTERECTOMY  1992    Current Medications: Current Meds  Medication Sig  acetaminophen (TYLENOL) 500 MG tablet Take 1,000 mg by mouth every 6 (six) hours as needed (pain).   candesartan (ATACAND) 16 MG tablet TAKE 1 TABLET BY MOUTH ONCE A DAY. MUST KEEP APPT IN JULY FOR FUTHER REFILLS.   cholecalciferol (VITAMIN D3) 25 MCG (1000 UT) tablet Take 1,000 Units by mouth daily.   esomeprazole (NEXIUM) 40 MG capsule Take 40 mg by mouth daily at 12 noon.   Probiotic Product (PROBIOTIC DAILY PO) Take 1 capsule by mouth  daily as needed (upset stomach).   vitamin C (ASCORBIC ACID) 500 MG tablet Take 500 mg by mouth daily.   [DISCONTINUED] hydrochlorothiazide (MICROZIDE) 12.5 MG capsule Take 1 capsule by mouth in the morning. Please keep July appt for further refills.   [DISCONTINUED] metoprolol succinate (TOPROL-XL) 50 MG 24 hr tablet Take 1 and 1/2  tablets by mouth daily. Needs to call and schedule an appointment for further refills 1st attempt   [DISCONTINUED] potassium chloride SA (KLOR-CON M) 20 MEQ tablet Take 1 tablet by mouth daily.     Allergies:   Patient has no known allergies.   Social History   Socioeconomic History   Marital status: Married    Spouse name: Purcell Nails   Number of children: Not on file   Years of education: Not on file   Highest education level: Not on file  Occupational History   Occupation: Therapist, sports- Engineer, drilling: [employer]  Tobacco Use   Smoking status: Never   Smokeless tobacco: Never  Vaping Use   Vaping Use: Never used  Substance and Sexual Activity   Alcohol use: Yes    Alcohol/week: 0.0 standard drinks of alcohol    Comment: occas wine   Drug use: No   Sexual activity: Not on file  Other Topics Concern   Not on file  Social History Narrative   Married, lives with spouse and occ kid when back at home. 3 grown kids. 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   Social Determinants of Radio broadcast assistant Strain: Not on file  Food Insecurity: Not on file  Transportation Needs: Not on file  Physical Activity: Not on file  Stress: Not on file  Social Connections: Not on file     Family History: The patient's family history includes Breast cancer in her mother; Colon cancer in her father and paternal aunt; Diabetes in her brother and another family member; Diabetes (age of onset: 63) in her mother; Heart attack in her brother; Heart disease in her father; Hyperlipidemia in an other family member; Hypertension in her brother and  another family member; Sarcoidosis in her brother; Stroke in her brother and father. There is no history of Esophageal cancer, Pancreatic cancer, Rectal cancer, Stomach cancer, or Colon polyps.  ROS:   Please see the history of present illness.    No specific complaints.  We discussed my retirement.  I recommended Dr. Gretta Cool to board Dr. Skeet Latch.  All other systems reviewed and are negative.  EKGs/Labs/Other Studies Reviewed:    The following studies were reviewed today: No imaging  EKG:  EKG normal sinus rhythm, left axis deviation, nonspecific T wave abnormality.  Compared to prior acing performed May 2022, no significant change  Recent Labs: 11/09/2021: ALT 9; BUN 11; Creatinine 0.90; Hemoglobin 12.1; Platelet Count 259; Potassium 4.2; Sodium 141  Recent Lipid Panel    Component Value Date/Time   CHOL 195 11/21/2015 1021   TRIG 142.0 11/21/2015 1021   HDL  33.60 (L) 11/21/2015 1021   CHOLHDL 6 11/21/2015 1021   VLDL 28.4 11/21/2015 1021   LDLCALC 133 (H) 11/21/2015 1021   LDLDIRECT 146.8 07/13/2013 0859    Physical Exam:    VS:  BP (!) 140/92   Pulse 68   Ht '5\' 6"'$  (1.676 m)   Wt 211 lb (95.7 kg)   SpO2 97%   BMI 34.06 kg/m     Wt Readings from Last 3 Encounters:  01/20/22 211 lb (95.7 kg)  11/09/21 213 lb 6.4 oz (96.8 kg)  10/20/21 214 lb (97.1 kg)     GEN: Overweight. No acute distress HEENT: Normal NECK: No JVD. LYMPHATICS: No lymphadenopathy CARDIAC: No murmur. RRR no gallop, or edema. VASCULAR:  Normal Pulses. No bruits. RESPIRATORY:  Clear to auscultation without rales, wheezing or rhonchi  ABDOMEN: Soft, non-tender, non-distended, No pulsatile mass, MUSCULOSKELETAL: No deformity  SKIN: Warm and dry NEUROLOGIC:  Alert and oriented x 3 PSYCHIATRIC:  Normal affect   ASSESSMENT:    1. Essential hypertension   2. Other hyperlipidemia   3. Morbid obesity ([place])   4. Premature ventricular contractions    PLAN:    In order of problems listed  above:  Globin elevated this afternoon.  Low-salt diet weight loss discussed.  Continue same therapy which is Microzide, Toprol, and Atacand.  Continue to monitor at home.  Target 130/80 10 mmHg. Not being treated at the patient's insistence. We discussed weight loss.  She understands the importance.  We discussed the Dash diet. Not a clinical problem at this time.   Follow-up for preventative management since she is high risk for vascular events giving untreated lipids and strong family history.  She requests Dr. Godfrey Pick Tobb or Dr. Skeet Latch.   Medication Adjustments/Labs and Tests Ordered: Current medicines are reviewed at length with the patient today.  Concerns regarding medicines are outlined above.  Orders Placed This Encounter  Procedures   EKG 12-Lead   Meds ordered this encounter  Medications   hydrochlorothiazide (MICROZIDE) 12.5 MG capsule    Sig: Take 1 capsule (12.5 mg total) by mouth in the morning.    Dispense:  90 capsule    Refill:  3   metoprolol succinate (TOPROL-XL) 50 MG 24 hr tablet    Sig: Take 1.5 tablets (75 mg total) by mouth daily.    Dispense:  135 tablet    Refill:  3   potassium chloride SA (KLOR-CON M) 20 MEQ tablet    Sig: Take 1 tablet (20 mEq total) by mouth daily.    Dispense:  90 tablet    Refill:  3    Patient Instructions  Medication Instructions:  Your physician recommends that you continue on your current medications as directed. Please refer to the Current Medication list given to you today.  *If you need a refill on your cardiac medications before your next appointment, please call your pharmacy*  Lab Work: NONE  Testing/Procedures: NONE  Follow-Up: At [facility], you and your health needs are our priority.  As part of our continuing mission to provide you with exceptional heart care, we have created designated Provider Care Teams.  These Care Teams include your primary Cardiologist (physician) and Advanced Practice  Providers (APPs -  Physician Assistants and Nurse Practitioners) who all work together to provide you with the care you need, when you need it.  Your next appointment:   1 year(s)  The format for your next appointment:   In Person  Provider:  Berniece Salines, MD or Skeet Latch, MD  Important Information About Sugar         Signed, Sinclair Grooms, MD  01/20/2022 4:08 PM    [facility]

## 2022-01-20 ENCOUNTER — Encounter: Payer: Self-pay | Admitting: Interventional Cardiology

## 2022-01-20 ENCOUNTER — Ambulatory Visit: Payer: PPO | Admitting: Interventional Cardiology

## 2022-01-20 ENCOUNTER — Other Ambulatory Visit (HOSPITAL_COMMUNITY): Payer: Self-pay

## 2022-01-20 VITALS — BP 140/92 | HR 68 | Ht 66.0 in | Wt 211.0 lb

## 2022-01-20 DIAGNOSIS — E7849 Other hyperlipidemia: Secondary | ICD-10-CM | POA: Diagnosis not present

## 2022-01-20 DIAGNOSIS — I10 Essential (primary) hypertension: Secondary | ICD-10-CM | POA: Diagnosis not present

## 2022-01-20 DIAGNOSIS — I493 Ventricular premature depolarization: Secondary | ICD-10-CM | POA: Diagnosis not present

## 2022-01-20 MED ORDER — POTASSIUM CHLORIDE CRYS ER 20 MEQ PO TBCR
20.0000 meq | EXTENDED_RELEASE_TABLET | Freq: Every day | ORAL | 3 refills | Status: DC
Start: 2022-01-20 — End: 2023-02-08
  Filled 2022-01-20 – 2022-02-10 (×2): qty 90, 90d supply, fill #0
  Filled 2022-05-13: qty 90, 90d supply, fill #1
  Filled 2022-08-14: qty 90, 90d supply, fill #2
  Filled 2022-11-08: qty 90, 90d supply, fill #3

## 2022-01-20 MED ORDER — HYDROCHLOROTHIAZIDE 12.5 MG PO CAPS
12.5000 mg | ORAL_CAPSULE | Freq: Every morning | ORAL | 3 refills | Status: DC
  Filled 2022-01-20 – 2022-02-23 (×2): qty 90, 90d supply, fill #0
  Filled 2022-05-24: qty 90, 90d supply, fill #1
  Filled 2022-08-23: qty 90, 90d supply, fill #2
  Filled 2022-11-22: qty 90, 90d supply, fill #3

## 2022-01-20 MED ORDER — METOPROLOL SUCCINATE ER 50 MG PO TB24
75.0000 mg | ORAL_TABLET | Freq: Every day | ORAL | 3 refills | Status: DC
Start: 2022-01-20 — End: 2023-02-08
  Filled 2022-01-20 – 2022-02-10 (×2): qty 135, 90d supply, fill #0
  Filled 2022-05-08: qty 135, 90d supply, fill #1
  Filled 2022-08-10: qty 135, 90d supply, fill #2
  Filled 2022-11-08: qty 135, 90d supply, fill #3

## 2022-01-20 NOTE — Patient Instructions (Signed)
Medication Instructions:  Your physician recommends that you continue on your current medications as directed. Please refer to the Current Medication list given to you today.  *If you need a refill on your cardiac medications before your next appointment, please call your pharmacy*  Lab Work: NONE  Testing/Procedures: NONE  Follow-Up: At [facility], you and your health needs are our priority.  As part of our continuing mission to provide you with exceptional heart care, we have created designated Provider Care Teams.  These Care Teams include your primary Cardiologist (physician) and Advanced Practice Providers (APPs -  Physician Assistants and Nurse Practitioners) who all work together to provide you with the care you need, when you need it.  Your next appointment:   1 year(s)  The format for your next appointment:   In Person  Provider:   Berniece Salines, MD or Skeet Latch, MD  Important Information About Sugar

## 2022-02-01 ENCOUNTER — Ambulatory Visit (INDEPENDENT_AMBULATORY_CARE_PROVIDER_SITE_OTHER): Payer: Self-pay | Admitting: *Deleted

## 2022-02-01 DIAGNOSIS — M76821 Posterior tibial tendinitis, right leg: Secondary | ICD-10-CM

## 2022-02-01 NOTE — Progress Notes (Signed)
Patient presents today to pick up custom molded foot orthotics, diagnosed with posterior tibial tendonitis by Dr. Alfonso Patten.   Orthotics were dispensed and fit was satisfactory. Reviewed instructions for break-in and wear. Written instructions given to patient.  Patient will follow up as needed.   [facility] - order # [id number]

## 2022-02-09 ENCOUNTER — Other Ambulatory Visit (HOSPITAL_COMMUNITY): Payer: Self-pay

## 2022-02-11 ENCOUNTER — Other Ambulatory Visit (HOSPITAL_COMMUNITY): Payer: Self-pay

## 2022-02-23 ENCOUNTER — Other Ambulatory Visit: Payer: Self-pay | Admitting: Interventional Cardiology

## 2022-02-23 ENCOUNTER — Other Ambulatory Visit (HOSPITAL_COMMUNITY): Payer: Self-pay

## 2022-02-24 ENCOUNTER — Other Ambulatory Visit (HOSPITAL_COMMUNITY): Payer: Self-pay

## 2022-02-24 MED ORDER — CANDESARTAN CILEXETIL 16 MG PO TABS
16.0000 mg | ORAL_TABLET | Freq: Every day | ORAL | 3 refills | Status: DC
  Filled 2022-02-24: qty 90, 90d supply, fill #0
  Filled 2022-05-24: qty 30, 30d supply, fill #1
  Filled 2022-05-27: qty 60, 60d supply, fill #1
  Filled 2022-08-21: qty 90, 90d supply, fill #2
  Filled 2022-11-22: qty 90, 90d supply, fill #3

## 2022-02-25 ENCOUNTER — Other Ambulatory Visit (HOSPITAL_COMMUNITY): Payer: Self-pay

## 2022-02-26 ENCOUNTER — Other Ambulatory Visit (HOSPITAL_COMMUNITY): Payer: Self-pay

## 2022-03-03 ENCOUNTER — Other Ambulatory Visit: Payer: Self-pay

## 2022-03-03 ENCOUNTER — Other Ambulatory Visit: Payer: Self-pay | Admitting: Internal Medicine

## 2022-03-03 ENCOUNTER — Other Ambulatory Visit (HOSPITAL_COMMUNITY): Payer: Self-pay

## 2022-03-04 ENCOUNTER — Other Ambulatory Visit (HOSPITAL_COMMUNITY): Payer: Self-pay

## 2022-03-06 ENCOUNTER — Other Ambulatory Visit: Payer: Self-pay | Admitting: Internal Medicine

## 2022-03-06 ENCOUNTER — Other Ambulatory Visit (HOSPITAL_COMMUNITY): Payer: Self-pay

## 2022-03-08 ENCOUNTER — Telehealth: Payer: Self-pay | Admitting: Internal Medicine

## 2022-03-08 ENCOUNTER — Other Ambulatory Visit (HOSPITAL_COMMUNITY): Payer: Self-pay

## 2022-03-08 NOTE — Telephone Encounter (Signed)
Inbound call from patient stating that the pharmacy had told patient that Nexium had been removed from her profile and is seeking advice. Please advise.

## 2022-03-09 ENCOUNTER — Other Ambulatory Visit (HOSPITAL_COMMUNITY): Payer: Self-pay

## 2022-03-09 NOTE — Telephone Encounter (Signed)
Spoke with pharmacy - Esomeprazole was discontinued - unclear why.  She is going to reactivate it and fill the prescription

## 2022-03-17 ENCOUNTER — Other Ambulatory Visit (HOSPITAL_COMMUNITY): Payer: Self-pay

## 2022-04-23 DIAGNOSIS — H43811 Vitreous degeneration, right eye: Secondary | ICD-10-CM | POA: Diagnosis not present

## 2022-04-23 DIAGNOSIS — H2513 Age-related nuclear cataract, bilateral: Secondary | ICD-10-CM | POA: Diagnosis not present

## 2022-04-23 DIAGNOSIS — H524 Presbyopia: Secondary | ICD-10-CM | POA: Diagnosis not present

## 2022-04-23 DIAGNOSIS — Z23 Encounter for immunization: Secondary | ICD-10-CM | POA: Diagnosis not present

## 2022-05-05 ENCOUNTER — Other Ambulatory Visit (HOSPITAL_COMMUNITY): Payer: Self-pay

## 2022-05-07 ENCOUNTER — Other Ambulatory Visit (HOSPITAL_COMMUNITY): Payer: Self-pay

## 2022-05-08 ENCOUNTER — Other Ambulatory Visit (HOSPITAL_COMMUNITY): Payer: Self-pay

## 2022-05-10 ENCOUNTER — Other Ambulatory Visit (HOSPITAL_COMMUNITY): Payer: Self-pay

## 2022-05-10 DIAGNOSIS — R946 Abnormal results of thyroid function studies: Secondary | ICD-10-CM | POA: Diagnosis not present

## 2022-05-10 DIAGNOSIS — E559 Vitamin D deficiency, unspecified: Secondary | ICD-10-CM | POA: Diagnosis not present

## 2022-05-10 DIAGNOSIS — K219 Gastro-esophageal reflux disease without esophagitis: Secondary | ICD-10-CM | POA: Diagnosis not present

## 2022-05-10 DIAGNOSIS — R7309 Other abnormal glucose: Secondary | ICD-10-CM | POA: Diagnosis not present

## 2022-05-10 DIAGNOSIS — I10 Essential (primary) hypertension: Secondary | ICD-10-CM | POA: Diagnosis not present

## 2022-05-14 ENCOUNTER — Other Ambulatory Visit (HOSPITAL_COMMUNITY): Payer: Self-pay

## 2022-05-17 DIAGNOSIS — I493 Ventricular premature depolarization: Secondary | ICD-10-CM | POA: Diagnosis not present

## 2022-05-17 DIAGNOSIS — I10 Essential (primary) hypertension: Secondary | ICD-10-CM | POA: Diagnosis not present

## 2022-05-17 DIAGNOSIS — R8290 Unspecified abnormal findings in urine: Secondary | ICD-10-CM | POA: Diagnosis not present

## 2022-05-17 DIAGNOSIS — M419 Scoliosis, unspecified: Secondary | ICD-10-CM | POA: Diagnosis not present

## 2022-05-17 DIAGNOSIS — E669 Obesity, unspecified: Secondary | ICD-10-CM | POA: Diagnosis not present

## 2022-05-17 DIAGNOSIS — Z0000 Encounter for general adult medical examination without abnormal findings: Secondary | ICD-10-CM | POA: Diagnosis not present

## 2022-05-17 DIAGNOSIS — K219 Gastro-esophageal reflux disease without esophagitis: Secondary | ICD-10-CM | POA: Diagnosis not present

## 2022-05-18 ENCOUNTER — Other Ambulatory Visit (HOSPITAL_COMMUNITY): Payer: Self-pay

## 2022-05-18 MED ORDER — CEPHALEXIN 500 MG PO CAPS
500.0000 mg | ORAL_CAPSULE | Freq: Three times a day (TID) | ORAL | 0 refills | Status: DC
  Filled 2022-05-18: qty 15, 5d supply, fill #0

## 2022-05-25 ENCOUNTER — Other Ambulatory Visit (HOSPITAL_COMMUNITY): Payer: Self-pay

## 2022-05-27 ENCOUNTER — Other Ambulatory Visit (HOSPITAL_COMMUNITY): Payer: Self-pay

## 2022-06-01 ENCOUNTER — Other Ambulatory Visit (HOSPITAL_COMMUNITY): Payer: Self-pay

## 2022-06-05 DIAGNOSIS — Z1231 Encounter for screening mammogram for malignant neoplasm of breast: Secondary | ICD-10-CM | POA: Diagnosis not present

## 2022-07-02 ENCOUNTER — Other Ambulatory Visit: Payer: Self-pay | Admitting: Internal Medicine

## 2022-07-05 ENCOUNTER — Other Ambulatory Visit: Payer: Self-pay

## 2022-07-05 ENCOUNTER — Other Ambulatory Visit (HOSPITAL_COMMUNITY): Payer: Self-pay

## 2022-07-05 MED ORDER — ESOMEPRAZOLE MAGNESIUM 40 MG PO CPDR
40.0000 mg | DELAYED_RELEASE_CAPSULE | Freq: Two times a day (BID) | ORAL | 3 refills | Status: DC
  Filled 2022-07-05: qty 60, 30d supply, fill #0
  Filled 2022-07-05: qty 200, 100d supply, fill #0

## 2022-07-06 ENCOUNTER — Other Ambulatory Visit (HOSPITAL_COMMUNITY): Payer: Self-pay

## 2022-07-06 ENCOUNTER — Encounter (HOSPITAL_COMMUNITY): Payer: Self-pay

## 2022-07-07 ENCOUNTER — Other Ambulatory Visit: Payer: Self-pay

## 2022-07-07 ENCOUNTER — Other Ambulatory Visit (HOSPITAL_COMMUNITY): Payer: Self-pay

## 2022-07-08 ENCOUNTER — Other Ambulatory Visit: Payer: Self-pay

## 2022-08-12 DIAGNOSIS — Z01419 Encounter for gynecological examination (general) (routine) without abnormal findings: Secondary | ICD-10-CM | POA: Diagnosis not present

## 2022-08-12 DIAGNOSIS — Z1272 Encounter for screening for malignant neoplasm of vagina: Secondary | ICD-10-CM | POA: Diagnosis not present

## 2022-08-23 ENCOUNTER — Other Ambulatory Visit: Payer: Self-pay

## 2022-08-24 ENCOUNTER — Encounter: Payer: Self-pay | Admitting: Internal Medicine

## 2022-08-24 ENCOUNTER — Other Ambulatory Visit (HOSPITAL_COMMUNITY): Payer: Self-pay

## 2022-11-23 ENCOUNTER — Other Ambulatory Visit (HOSPITAL_COMMUNITY): Payer: Self-pay

## 2022-11-23 ENCOUNTER — Other Ambulatory Visit: Payer: Self-pay

## 2022-11-25 ENCOUNTER — Other Ambulatory Visit (HOSPITAL_COMMUNITY): Payer: Self-pay

## 2022-11-30 ENCOUNTER — Encounter: Payer: Self-pay | Admitting: Internal Medicine

## 2022-11-30 ENCOUNTER — Ambulatory Visit (INDEPENDENT_AMBULATORY_CARE_PROVIDER_SITE_OTHER): Payer: PPO | Admitting: Internal Medicine

## 2022-11-30 ENCOUNTER — Other Ambulatory Visit (HOSPITAL_COMMUNITY): Payer: Self-pay

## 2022-11-30 VITALS — BP 138/82 | HR 57 | Wt 199.0 lb

## 2022-11-30 DIAGNOSIS — Z800 Family history of malignant neoplasm of digestive organs: Secondary | ICD-10-CM

## 2022-11-30 DIAGNOSIS — K222 Esophageal obstruction: Secondary | ICD-10-CM

## 2022-11-30 DIAGNOSIS — K219 Gastro-esophageal reflux disease without esophagitis: Secondary | ICD-10-CM

## 2022-11-30 MED ORDER — ESOMEPRAZOLE MAGNESIUM 40 MG PO CPDR
40.0000 mg | DELAYED_RELEASE_CAPSULE | Freq: Two times a day (BID) | ORAL | 3 refills | Status: DC
  Filled 2022-11-30 – 2023-01-16 (×2): qty 180, 90d supply, fill #0
  Filled 2023-07-19: qty 180, 90d supply, fill #1

## 2022-11-30 NOTE — Patient Instructions (Signed)
We have sent the following medications to your pharmacy for you to pick up at your convenience:  Nexium  Please follow up in one year.  _______________________________________________________  If your blood pressure at your visit was 140/90 or greater, please contact your primary care physician to follow up on this.  _______________________________________________________  If you are age 69 or older, your body mass index should be between 23-30. Your Body mass index is 32.12 kg/m. If this is out of the aforementioned range listed, please consider follow up with your Primary Care Provider.  If you are age 62 or younger, your body mass index should be between 19-25. Your Body mass index is 32.12 kg/m. If this is out of the aformentioned range listed, please consider follow up with your Primary Care Provider.   ________________________________________________________  The [facility] providers would like to encourage you to use [facility] to communicate with providers for non-urgent requests or questions.  Due to long hold times on the telephone, sending your provider a message by [facility] may be a faster and more efficient way to get a response.  Please allow 48 business hours for a response.  Please remember that this is for non-urgent requests.  _______________________________________________________

## 2022-11-30 NOTE — Progress Notes (Signed)
HISTORY OF PRESENT ILLNESS:  Janice Rodriguez is a 67 y.o. 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  She was last seen in this office August 18, 2021.  See that dictation.  At that time her reflux symptoms were well-controlled with daily PPI in the form of Nexium 40 mg daily.  She was complaining of dysphagia and underwent upper endoscopy and colonoscopy October 20, 2021.  Colonoscopy revealed diminutive colon polyps which were removed and diverticulosis.  Follow-up in 5 years recommended.  Upper endoscopy revealed a benign esophageal stricture which was dilated with 54 French Maloney dilator.  She was continued on Nexium and told to follow-up at this time.  She please report that Nexium is controlling her reflux symptoms well.  She has had no further issues with dysphagia.  No lower GI complaints.  She does request medication refill.  REVIEW OF SYSTEMS:  All non-GI ROS negative unless otherwise stated in HPI. 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, CXR: No definite evidence to suggest active intrathoracic tuberculosis    UMBILICAL HERNIORRHAPHY, HX OF    age 68   Vitamin D deficiency    Wears glasses     Past Surgical History:  Procedure Laterality Date   BILIARY STENT  PLACEMENT  11/30/2018   Procedure: BILIARY STENT PLACEMENT;  Surgeon: Meryl Dare, MD;  Location: [facility] ENDOSCOPY;  Service: Endoscopy;;   CHOLECYSTECTOMY N/A 11/26/2018   Procedure: LAPAROSCOPIC CHOLECYSTECTOMY WITH INTRAOPERATIVE CHOLANGIOGRAM;  Surgeon: Emelia Loron, MD;  Location: [facility] OR;  Service: General;  Laterality: N/A;   COLONOSCOPY  2012   Tics   COLONOSCOPY  10/2015   ECTOPIC PREGNANCY SURGERY  1983   ENDOSCOPIC RETROGRADE CHOLANGIOPANCREATOGRAPHY (ERCP) WITH PROPOFOL N/A 11/17/2018   Procedure: ENDOSCOPIC RETROGRADE CHOLANGIOPANCREATOGRAPHY (ERCP) WITH PROPOFOL;  Surgeon: Hilarie Fredrickson, MD;  Location: [facility] ENDOSCOPY;  Service: Endoscopy;  Laterality: N/A;   ENDOSCOPIC RETROGRADE CHOLANGIOPANCREATOGRAPHY (ERCP) WITH PROPOFOL N/A 11/30/2018   Procedure: ENDOSCOPIC RETROGRADE CHOLANGIOPANCREATOGRAPHY (ERCP) WITH PROPOFOL;  Surgeon: Meryl Dare, MD;  [facility] ENDOSCOPY;  Service: Endoscopy;  Laterality: N/A;   ERCP N/A 01/22/2019   Procedure: ENDOSCOPIC RETROGRADE CHOLANGIOPANCREATOGRAPHY (ERCP);  Surgeon: Hilarie Fredrickson, MD;  Location: [facility] ENDOSCOPY;  Service: Endoscopy;  Laterality: N/A;   REMOVAL OF STONES  11/17/2018   Procedure: REMOVAL OF STONES;  Surgeon: Hilarie Fredrickson, MD;  Location: [facility] ENDOSCOPY;  Service: Endoscopy;;   REMOVAL OF STONES  11/30/2018   Procedure: REMOVAL OF STONES;  Surgeon: Meryl Dare, MD;  Location: [facility] ENDOSCOPY;  Service: Endoscopy;;   SPHINCTEROTOMY  11/17/2018   Procedure: Dennison Mascot;  Surgeon: Hilarie Fredrickson, MD;  Location: [facility] ENDOSCOPY;  Service: Endoscopy;;   UMBILICAL HERNIA REPAIR  1963   UPPER GASTROINTESTINAL ENDOSCOPY  10/2015   VAGINAL HYSTERECTOMY  1992    Social History Janice Rodriguez  reports that she has never smoked. She has never used smokeless tobacco. She reports current alcohol use. She reports that she does not use drugs.  family history includes Breast cancer in her mother; Colon cancer in her father and paternal  aunt; Diabetes in her brother and another family member; Diabetes (age of onset: 75) in her mother; Heart attack in her brother; Heart disease in her father; Hyperlipidemia in an other family member; Hypertension in her brother and another family member; Sarcoidosis in her brother; Stroke in her brother and father.  No Known Allergies     PHYSICAL EXAMINATION: Vital signs: BP 138/82   Pulse (!) 57   Wt 199 lb (90.3 kg)   SpO2 96%   BMI 32.12 kg/m   Constitutional: generally well-appearing, no acute distress Psychiatric: alert and oriented x3, cooperative Eyes: extraocular movements intact, anicteric, conjunctiva pink Mouth: oral pharynx moist, no lesions Neck: supple no lymphadenopathy Cardiovascular: heart regular rate and rhythm, no murmur Lungs: clear to auscultation bilaterally Abdomen: soft, nontender, nondistended, no obvious ascites, no peritoneal signs, normal bowel sounds, no organomegaly Rectal: Omitted Extremities: no clubbing, cyanosis, or lower extremity edema bilaterally Skin: no lesions on visible extremities Neuro: No focal deficits.  Cranial nerves intact  ASSESSMENT:  1.  GERD complicated by peptic stricture.  Asymptomatic post dilation (April 2023) on PPI (Nexium). 2.  Family history of colon cancer and personal history of adenomatous colon polyps.  Last colonoscopy April 2023.   PLAN:  1.  Reflux precautions 2.  Refill Nexium 40 mg daily.  Medication risk reviewed 3.  Routine office follow-up 1 year 4.  Surveillance colonoscopy around April 2028.

## 2022-12-08 ENCOUNTER — Other Ambulatory Visit (HOSPITAL_COMMUNITY): Payer: Self-pay

## 2022-12-08 DIAGNOSIS — L818 Other specified disorders of pigmentation: Secondary | ICD-10-CM | POA: Diagnosis not present

## 2022-12-08 DIAGNOSIS — L821 Other seborrheic keratosis: Secondary | ICD-10-CM | POA: Diagnosis not present

## 2023-01-17 ENCOUNTER — Other Ambulatory Visit (HOSPITAL_COMMUNITY): Payer: Self-pay

## 2023-01-19 ENCOUNTER — Other Ambulatory Visit (HOSPITAL_COMMUNITY): Payer: Self-pay

## 2023-01-31 ENCOUNTER — Ambulatory Visit (HOSPITAL_BASED_OUTPATIENT_CLINIC_OR_DEPARTMENT_OTHER): Payer: PPO | Admitting: Cardiovascular Disease

## 2023-01-31 ENCOUNTER — Encounter (HOSPITAL_BASED_OUTPATIENT_CLINIC_OR_DEPARTMENT_OTHER): Payer: Self-pay | Admitting: Cardiovascular Disease

## 2023-01-31 VITALS — BP 136/82 | HR 68 | Ht 66.0 in | Wt 192.8 lb

## 2023-01-31 DIAGNOSIS — E7849 Other hyperlipidemia: Secondary | ICD-10-CM

## 2023-01-31 DIAGNOSIS — I700 Atherosclerosis of aorta: Secondary | ICD-10-CM

## 2023-01-31 DIAGNOSIS — I10 Essential (primary) hypertension: Secondary | ICD-10-CM | POA: Diagnosis not present

## 2023-01-31 DIAGNOSIS — Z5181 Encounter for therapeutic drug level monitoring: Secondary | ICD-10-CM

## 2023-01-31 HISTORY — DX: Atherosclerosis of aorta: I70.0

## 2023-01-31 NOTE — Patient Instructions (Addendum)
Medication Instructions:  Your physician recommends that you continue on your current medications as directed. Please refer to the Current Medication list given to you today.   *If you need a refill on your cardiac medications before your next appointment, please call your pharmacy*  Lab Work: FASTING LP/CMET 1 WEEK PRIOR TO FOLLOW UP   If you have labs (blood work) drawn today and your tests are completely normal, you will receive your results only by: MyChart Message (if you have MyChart) OR A paper copy in the mail If you have any lab test that is abnormal or we need to change your treatment, we will call you to review the results.  Testing/Procedures: NONE  Follow-Up: At [facility], you and your health needs are our priority.  As part of our continuing mission to provide you with exceptional heart care, we have created designated Provider Care Teams.  These Care Teams include your primary Cardiologist (physician) and Advanced Practice Providers (APPs -  Physician Assistants and Nurse Practitioners) who all work together to provide you with the care you need, when you need it.  We recommend signing up for the patient portal called "MyChart".  Sign up information is provided on this After Visit Summary.  MyChart is used to connect with patients for Virtual Visits (Telemedicine).  Patients are able to view lab/test results, encounter notes, upcoming appointments, etc.  Non-urgent messages can be sent to your provider as well.   To learn more about what you can do with MyChart, go to [email or web address].    Your next appointment:   3 month(s)  Provider:   Chilton Si, MD or Gillian Shields, NP   SOMEONE FROM THE [employer] PREP PROGRAM WILL BE IN TOUCH  IF YOU DO NOT HEAR FROM THEM IN 2 WEEKS PLEASE CALL TO FOLLOW UP   Other Instructions Exercise recommendations: The American Heart Association recommends 150 minutes of moderate intensity exercise weekly. Try 30 minutes  of moderate intensity exercise 4-5 times per week. This could include walking, jogging, or swimming.  LIMIT YOUR SODIUM INTAKE TO 2300 MG DAILY   Cooking With Less Salt Cooking with less salt is one way to reduce the amount of salt (sodium) you get from food. Most people should have less than 2,300 milligrams (mg) of sodium each day. If you have high blood pressure (hypertension), you may need to limit your sodium to 1,500 mg each day. Follow the tips below to help reduce your sodium intake. What are tips for eating less sodium? Reading food labels  Check the food label before buying or using packaged ingredients. Always check the label for the serving size and sodium content. Choose products with less than 140 mg of sodium per serving. Check the % Daily Value column to see what percent of the daily recommended amount of sodium is in one serving of the product. Foods with 5% or less are low in sodium. Foods with 20% or more are high in sodium. Do not choose foods that have salt as one of the first three ingredients on the ingredients list. Always check how much sodium is in a product, even if the label says "unsalted" or "no salt added." Shopping Buy sodium-free or low-sodium products. Look for these words: Low-sodium. Sodium-free. Reduced-sodium. No salt added. Unsalted. Buy fresh or frozen foods without sauces or additives. Cooking Instead of salt, use herbs, seasonings without salt, and spices. Use sodium-free baking soda. Grill, braise, or roast foods to add flavor with less salt. Do  not add salt to pasta, rice, or hot cereals. Drain and rinse canned vegetables, beans, and meat before use. Do not add salt when cooking sweets and desserts. Cook with low-sodium ingredients. Meal planning The sodium in bread can add up. Try to plan meals with other grains. These may include whole oats, quinoa, whole wheat pasta, and other whole grains that do not have sodium added to them. What  foods are high in sodium? Vegetables Regular canned vegetables, except low-sodium or reduced-sodium items. Sauerkraut, pickled vegetables, and relishes. Olives. [place] fries. Onion rings. Regular canned tomato sauce and paste. Regular tomato and vegetable juice. Frozen vegetables in sauces. Grains Instant hot cereals. Bread stuffing, pancake, and biscuit mixes. Croutons. Seasoned rice or pasta mixes. Noodle soup cups. Boxed or frozen macaroni and cheese. Regular salted crackers. Self-rising flour. Rolls. Bagels. Flour tortillas and wraps. Meats and other proteins Meat or fish that is salted, canned, smoked, cured, spiced, or pickled. Precooked or cured meat, such as sausages or meat loaves. Tomasa Blase. Ham. Pepperoni. Hot dogs. Corned beef. Chipped beef. Salt pork. Jerky. Pickled herring, anchovies, and sardines. Regular canned tuna. Salted nuts. Dairy Processed cheese and cheese spreads. Hard cheeses. Cheese curds. Blue cheese. Feta cheese. String cheese. Regular cottage cheese. Buttermilk. Canned milk. The items listed above may not be a full list of foods high in sodium. Talk to a dietitian to learn more. What foods are low in sodium? Fruits Fresh, frozen, or canned fruit with no sauce added. Fruit juice. Vegetables Fresh or frozen vegetables with no sauce added. "No salt added" canned vegetables. "No salt added" tomato sauce and paste. Low-sodium or reduced-sodium tomato and vegetable juice. Grains Noodles, pasta, quinoa, rice. Shredded or puffed wheat or puffed rice. Regular or quick oats (not instant). Low-sodium crackers. Low-sodium bread. Whole grain bread and whole grain pasta. Unsalted popcorn. Meats and other proteins Fresh or frozen whole meats, poultry that has not been injected with sodium, and fish with no sauce added. Unsalted nuts. Dried peas, beans, and lentils without added salt. Unsalted canned beans. Eggs. Unsalted nut butters. Low-sodium canned tuna or chicken. Dairy Milk. Soy  milk. Yogurt. Low-sodium cheeses, such as Swiss, [address], [place], and [employer]. Sherbet or ice cream (keep to  cup per serving). Cream cheese. Fats and oils Unsalted butter or margarine. Other foods Homemade pudding. Sodium-free baking soda and baking powder. Herbs and spices. Low-sodium seasoning mixes. Beverages Coffee and tea. Carbonated beverages. The items listed above may not be a full list of foods low in sodium. Talk to a dietitian to learn more. What are some salt alternatives when cooking? Herbs, seasonings, and spices can be used instead of salt to flavor your food. Herbs should be fresh or dried. Do not choose packaged mixes. Next to the name of the herb, spice, or seasoning below are some foods you can pair it with. Herbs [place] leaves - Soups, meat and vegetable dishes, and spaghetti sauce. Basil - [employer], soups, pasta, and fish dishes. Cilantro - Meat, poultry, and vegetable dishes. Chili powder - Marinades and Mexican dishes. Chives - Salad dressings and potato dishes. Cumin - Mexican dishes, couscous, and meat dishes. Dill - Fish dishes, sauces, and salads. Fennel - Meat and vegetable dishes, breads, and cookies. Garlic (do not use garlic salt) - [place] dishes, meat dishes, salad dressings, and sauces. Marjoram - Soups, potato dishes, and meat dishes. Oregano - Pizza and spaghetti sauce. Parsley - Salads, soups, pasta, and meat dishes. Rosemary - [employer], salad dressings, soups,  and red meats. Saffron - Fish dishes, pasta, and some poultry dishes. Sage - Stuffings and sauces. Tarragon - Fish and Whole Foods. Thyme - Stuffing, meat, and fish dishes. Seasonings Lemon juice - Fish dishes, poultry dishes, vegetables, and salads. Vinegar - Salad dressings, vegetables, and fish dishes. Spices Cinnamon - Sweet dishes, such as cakes, cookies, and puddings. Cloves - Gingerbread, puddings, and marinades for meats. Curry - Vegetable dishes, fish and  poultry dishes, and stir-fry dishes. Ginger - Vegetable dishes, fish dishes, and stir-fry dishes. Nutmeg - Pasta, vegetables, poultry, fish dishes, and custard. This information is not intended to replace advice given to you by your health care provider. Make sure you discuss any questions you have with your health care provider. Document Revised: 07/08/2022 Document Reviewed: 07/01/2022 Elsevier Patient Education  2024 [employer].

## 2023-01-31 NOTE — Progress Notes (Signed)
Cardiology Office Note:  .    Date:  01/31/2023  ID:  Rodman Pickle, DOB 12-Apr-1956, MRN [id number] PCP: Irena Reichmann, DO  [facility] Providers Cardiologist:  Lesleigh Noe, MD (Inactive)     History of Present Illness: .    Umaiza Wishon is a 67 y.o. female with CAD, aortic atherosclerosis, PVC's, hypertension, hyperlipidemia, who presents for follow-up and to establish care with me. She is a former patient of Dr. Katrinka Blazing, last seen by him 01/20/2022. Dr. Katrinka Blazing has recommended aggressive medical management of her disease but she has declined statin therapy.  Today, she is accompanied by her husband. In the office her blood pressure is 136/82. She reports average readings of 130s/80s at home. No anginal symptoms, breathing is stable. She denies LE swelling, but does have varicose veins. Lately she has not been exercising much. However, she has signed up for [employer] and hopes to work on increasing her activity. Regarding her diet, she states that they order out frequently. Has never tried statin therapy. She notes that many of her family members who have had cardiovascular disease received little to no benefit from the medications. She denies any palpitations, chest pain, lightheadedness, headaches, syncope, orthopnea, or PND.  ROS:  Please see the history of present illness. All other systems are reviewed and negative.  (+) Varicose veins  Studies Reviewed: .        Risk Assessment/Calculations:             Physical Exam:    VS:  BP 136/82 (BP Location: Left Arm, Patient Position: Sitting, Cuff Size: Large)   Pulse 68   Ht 5\' 6"  (1.676 m)   Wt 192 lb 12.8 oz (87.5 kg)   BMI 31.12 kg/m  , BMI Body mass index is 31.12 kg/m. GENERAL:  Well appearing HEENT: Pupils equal round and reactive, fundi not visualized, oral mucosa unremarkable NECK:  No jugular venous distention, waveform within normal limits, carotid upstroke brisk and symmetric, no bruits, no  thyromegaly LUNGS:  Clear to auscultation bilaterally HEART:  RRR.  PMI not displaced or sustained,S1 and S2 within normal limits, no S3, no S4, no clicks, no rubs, no murmurs ABD:  Flat, positive bowel sounds normal in frequency in pitch, no bruits, no rebound, no guarding, no midline pulsatile mass, no hepatomegaly, no splenomegaly EXT:  2 plus pulses throughout, no edema, no cyanosis no clubbing SKIN:  No rashes no nodules NEURO:  Cranial nerves II through XII grossly intact, motor grossly intact throughout PSYCH:  Cognitively intact, oriented to person place and time  Wt Readings from Last 3 Encounters:  01/31/23 192 lb 12.8 oz (87.5 kg)  11/30/22 199 lb (90.3 kg)  01/20/22 211 lb (95.7 kg)     ASSESSMENT AND PLAN: .    # Aortic atherosclerosis: # Hyperlipidemia: LDL goal is <70.  She has been uninterested in taking cholesterol medications.  We discussed this recommendation again, especially given her strong family history of ASCVD.  She will consider. She will also work on increasing her exercise. Goal is at least 150 minutes/week. Refer to the PREP program at the [facility].  Repeat fasting lipids/CMP in 3 months.  She has declined coronary calcium scoring.   # Hypertension:  BP slightly above her goal of <130/80.  Work on diet and exercise as above.  Limit sodium to <2300 mg daily.  -Continue candesartan, hydrochlorothiazide and metoprolol        Dispo:  FU with  C. Duke Salvia,  MD, [facility] in 6 months.  I,Mathew Stumpf,acting as a Neurosurgeon for Chilton Si, MD.,have documented all relevant documentation on the behalf of Chilton Si, MD,as directed by  Chilton Si, MD while in the presence of Chilton Si, MD.  I,  C. Duke Salvia, MD have reviewed all documentation for this visit.  The documentation of the exam, diagnosis, procedures, and orders on 01/31/2023 are all accurate and complete.   Signed, Chilton Si, MD

## 2023-02-04 ENCOUNTER — Telehealth: Payer: Self-pay | Admitting: *Deleted

## 2023-02-04 NOTE — Telephone Encounter (Signed)
Contacted regarding PREP Class referral. She does not wish to commit at this time. Invited her to call us back if she would like to consider participating in the future.

## 2023-02-06 ENCOUNTER — Other Ambulatory Visit: Payer: Self-pay

## 2023-02-07 ENCOUNTER — Other Ambulatory Visit (HOSPITAL_COMMUNITY): Payer: Self-pay

## 2023-02-07 ENCOUNTER — Other Ambulatory Visit: Payer: Self-pay | Admitting: Cardiovascular Disease

## 2023-02-08 ENCOUNTER — Telehealth: Payer: Self-pay | Admitting: Cardiovascular Disease

## 2023-02-08 ENCOUNTER — Other Ambulatory Visit (HOSPITAL_COMMUNITY): Payer: Self-pay

## 2023-02-08 MED ORDER — METOPROLOL SUCCINATE ER 50 MG PO TB24
75.0000 mg | ORAL_TABLET | Freq: Every day | ORAL | 3 refills | Status: DC
  Filled 2023-02-08: qty 135, 90d supply, fill #0
  Filled 2023-05-08: qty 135, 90d supply, fill #1
  Filled 2023-08-06: qty 135, 90d supply, fill #2
  Filled 2023-11-04: qty 135, 90d supply, fill #3

## 2023-02-08 MED ORDER — POTASSIUM CHLORIDE CRYS ER 20 MEQ PO TBCR
20.0000 meq | EXTENDED_RELEASE_TABLET | Freq: Every day | ORAL | 3 refills | Status: DC
  Filled 2023-02-08: qty 90, 90d supply, fill #0
  Filled 2023-05-08: qty 90, 90d supply, fill #1
  Filled 2023-08-06: qty 90, 90d supply, fill #2
  Filled 2023-11-04: qty 90, 90d supply, fill #3

## 2023-02-08 NOTE — Telephone Encounter (Signed)
*  STAT* If patient is at the pharmacy, call can be transferred to refill team.   1. Which medications need to be refilled? (please list name of each medication and dose if known)     metoprolol succinate (TOPROL-XL) 50 MG 24 hr tablet Take 1 & 1/2 tablets (75 mg total) by mouth daily.   potassium chloride SA (KLOR-CON M) 20 MEQ tablet Take 1 tablet (20 mEq total) by mouth daily.     2. Would you like to learn more about the convenience, safety, & potential cost savings by using the [facility]? No     3. Are you open to using the [facility] (Type [facility]. . [place] [facility] [address]     4. Which pharmacy/location (including street and city if local pharmacy) is medication to be sent to?  [facility]     5. Do they need a 30 day or 90 day supply? 90

## 2023-02-08 NOTE — Telephone Encounter (Signed)
Rx request sent to pharmacy.  

## 2023-02-09 ENCOUNTER — Other Ambulatory Visit (HOSPITAL_COMMUNITY): Payer: Self-pay

## 2023-02-17 ENCOUNTER — Other Ambulatory Visit: Payer: Self-pay | Admitting: Cardiovascular Disease

## 2023-02-17 ENCOUNTER — Other Ambulatory Visit: Payer: Self-pay

## 2023-02-17 ENCOUNTER — Other Ambulatory Visit (HOSPITAL_COMMUNITY): Payer: Self-pay

## 2023-02-17 MED ORDER — CANDESARTAN CILEXETIL 16 MG PO TABS
16.0000 mg | ORAL_TABLET | Freq: Every day | ORAL | 1 refills | Status: DC
  Filled 2023-02-17: qty 90, 90d supply, fill #0
  Filled 2023-05-17: qty 90, 90d supply, fill #1

## 2023-02-17 NOTE — Telephone Encounter (Signed)
Rx request sent to pharmacy.  

## 2023-02-18 ENCOUNTER — Other Ambulatory Visit: Payer: Self-pay

## 2023-02-21 ENCOUNTER — Other Ambulatory Visit: Payer: Self-pay | Admitting: Interventional Cardiology

## 2023-02-21 ENCOUNTER — Other Ambulatory Visit (HOSPITAL_COMMUNITY): Payer: Self-pay

## 2023-02-21 MED ORDER — HYDROCHLOROTHIAZIDE 12.5 MG PO CAPS
12.5000 mg | ORAL_CAPSULE | Freq: Every morning | ORAL | 3 refills | Status: DC
  Filled 2023-02-21: qty 90, 90d supply, fill #0
  Filled 2023-05-24: qty 90, 90d supply, fill #1
  Filled 2023-08-20 (×2): qty 90, 90d supply, fill #2
  Filled 2023-11-19: qty 90, 90d supply, fill #3

## 2023-02-22 ENCOUNTER — Other Ambulatory Visit (HOSPITAL_COMMUNITY): Payer: Self-pay

## 2023-03-11 DIAGNOSIS — Z23 Encounter for immunization: Secondary | ICD-10-CM | POA: Diagnosis not present

## 2023-04-22 DIAGNOSIS — R319 Hematuria, unspecified: Secondary | ICD-10-CM | POA: Diagnosis not present

## 2023-04-29 ENCOUNTER — Other Ambulatory Visit (HOSPITAL_COMMUNITY): Payer: Self-pay

## 2023-04-29 DIAGNOSIS — H52203 Unspecified astigmatism, bilateral: Secondary | ICD-10-CM | POA: Diagnosis not present

## 2023-04-29 DIAGNOSIS — H2513 Age-related nuclear cataract, bilateral: Secondary | ICD-10-CM | POA: Diagnosis not present

## 2023-04-29 DIAGNOSIS — H5203 Hypermetropia, bilateral: Secondary | ICD-10-CM | POA: Diagnosis not present

## 2023-04-29 MED ORDER — CEPHALEXIN 500 MG PO CAPS
500.0000 mg | ORAL_CAPSULE | Freq: Three times a day (TID) | ORAL | 0 refills | Status: AC
  Filled 2023-04-29: qty 21, 7d supply, fill #0

## 2023-05-10 ENCOUNTER — Other Ambulatory Visit (HOSPITAL_COMMUNITY): Payer: Self-pay

## 2023-05-17 ENCOUNTER — Other Ambulatory Visit: Payer: Self-pay

## 2023-05-19 ENCOUNTER — Other Ambulatory Visit (HOSPITAL_COMMUNITY): Payer: Self-pay

## 2023-05-23 DIAGNOSIS — R7309 Other abnormal glucose: Secondary | ICD-10-CM | POA: Diagnosis not present

## 2023-05-23 DIAGNOSIS — R946 Abnormal results of thyroid function studies: Secondary | ICD-10-CM | POA: Diagnosis not present

## 2023-05-23 DIAGNOSIS — Z79899 Other long term (current) drug therapy: Secondary | ICD-10-CM | POA: Diagnosis not present

## 2023-05-23 DIAGNOSIS — Z13220 Encounter for screening for lipoid disorders: Secondary | ICD-10-CM | POA: Diagnosis not present

## 2023-05-23 DIAGNOSIS — E559 Vitamin D deficiency, unspecified: Secondary | ICD-10-CM | POA: Diagnosis not present

## 2023-05-30 DIAGNOSIS — I493 Ventricular premature depolarization: Secondary | ICD-10-CM | POA: Diagnosis not present

## 2023-05-30 DIAGNOSIS — I10 Essential (primary) hypertension: Secondary | ICD-10-CM | POA: Diagnosis not present

## 2023-05-30 DIAGNOSIS — E559 Vitamin D deficiency, unspecified: Secondary | ICD-10-CM | POA: Diagnosis not present

## 2023-05-30 DIAGNOSIS — R7309 Other abnormal glucose: Secondary | ICD-10-CM | POA: Diagnosis not present

## 2023-05-30 DIAGNOSIS — M419 Scoliosis, unspecified: Secondary | ICD-10-CM | POA: Diagnosis not present

## 2023-05-30 DIAGNOSIS — Z0000 Encounter for general adult medical examination without abnormal findings: Secondary | ICD-10-CM | POA: Diagnosis not present

## 2023-05-30 DIAGNOSIS — Z13820 Encounter for screening for osteoporosis: Secondary | ICD-10-CM | POA: Diagnosis not present

## 2023-05-30 DIAGNOSIS — Z780 Asymptomatic menopausal state: Secondary | ICD-10-CM | POA: Diagnosis not present

## 2023-05-30 DIAGNOSIS — R946 Abnormal results of thyroid function studies: Secondary | ICD-10-CM | POA: Diagnosis not present

## 2023-05-30 DIAGNOSIS — N8110 Cystocele, unspecified: Secondary | ICD-10-CM | POA: Diagnosis not present

## 2023-05-30 DIAGNOSIS — K219 Gastro-esophageal reflux disease without esophagitis: Secondary | ICD-10-CM | POA: Diagnosis not present

## 2023-06-02 DIAGNOSIS — N814 Uterovaginal prolapse, unspecified: Secondary | ICD-10-CM | POA: Diagnosis not present

## 2023-06-07 DIAGNOSIS — Z1231 Encounter for screening mammogram for malignant neoplasm of breast: Secondary | ICD-10-CM | POA: Diagnosis not present

## 2023-06-23 ENCOUNTER — Other Ambulatory Visit (HOSPITAL_COMMUNITY): Payer: Self-pay

## 2023-06-23 MED ORDER — NITROFURANTOIN MONOHYD MACRO 100 MG PO CAPS
100.0000 mg | ORAL_CAPSULE | Freq: Two times a day (BID) | ORAL | 0 refills | Status: DC
  Filled 2023-06-23: qty 14, 7d supply, fill #0

## 2023-07-20 ENCOUNTER — Other Ambulatory Visit (HOSPITAL_COMMUNITY): Payer: Self-pay

## 2023-07-21 ENCOUNTER — Other Ambulatory Visit (HOSPITAL_COMMUNITY): Payer: Self-pay

## 2023-08-03 ENCOUNTER — Other Ambulatory Visit (HOSPITAL_COMMUNITY): Payer: Self-pay

## 2023-08-17 ENCOUNTER — Other Ambulatory Visit: Payer: Self-pay

## 2023-08-17 ENCOUNTER — Other Ambulatory Visit (HOSPITAL_COMMUNITY): Payer: Self-pay

## 2023-08-17 ENCOUNTER — Other Ambulatory Visit: Payer: Self-pay | Admitting: Cardiovascular Disease

## 2023-08-17 MED ORDER — CANDESARTAN CILEXETIL 16 MG PO TABS
16.0000 mg | ORAL_TABLET | Freq: Every day | ORAL | 0 refills | Status: DC
  Filled 2023-08-17: qty 90, 90d supply, fill #0

## 2023-08-19 DIAGNOSIS — Z01419 Encounter for gynecological examination (general) (routine) without abnormal findings: Secondary | ICD-10-CM | POA: Diagnosis not present

## 2023-08-19 DIAGNOSIS — Z90711 Acquired absence of uterus with remaining cervical stump: Secondary | ICD-10-CM | POA: Diagnosis not present

## 2023-08-19 DIAGNOSIS — N8110 Cystocele, unspecified: Secondary | ICD-10-CM | POA: Diagnosis not present

## 2023-08-19 DIAGNOSIS — Z6836 Body mass index (BMI) 36.0-36.9, adult: Secondary | ICD-10-CM | POA: Diagnosis not present

## 2023-08-20 ENCOUNTER — Other Ambulatory Visit (HOSPITAL_COMMUNITY): Payer: Self-pay

## 2023-11-11 ENCOUNTER — Other Ambulatory Visit: Payer: Self-pay | Admitting: Cardiovascular Disease

## 2023-11-11 ENCOUNTER — Other Ambulatory Visit (HOSPITAL_COMMUNITY): Payer: Self-pay

## 2023-11-11 MED ORDER — CANDESARTAN CILEXETIL 16 MG PO TABS
16.0000 mg | ORAL_TABLET | Freq: Every day | ORAL | 0 refills | Status: DC
  Filled 2023-11-11: qty 90, 90d supply, fill #0

## 2023-11-14 ENCOUNTER — Other Ambulatory Visit (HOSPITAL_COMMUNITY): Payer: Self-pay

## 2023-11-14 ENCOUNTER — Other Ambulatory Visit: Payer: Self-pay

## 2023-11-15 ENCOUNTER — Other Ambulatory Visit (HOSPITAL_COMMUNITY): Payer: Self-pay

## 2024-01-12 NOTE — Progress Notes (Unsigned)
 New Patient Evaluation and Consultation  Referring Provider: Armond Cape, MD PCP: Gerome Brunet, DO Date of Service: 01/13/2024  SUBJECTIVE Chief Complaint: No chief complaint on file.  History of Present Illness: Janice Rodriguez is a 68 y.o. Black or African-American female seen in consultation at the request of Dr Armond for evaluation of pelvic organ prolapse.    Underwent pelvic floor PT 2021 without relief  Review of records significant for: ***AKI, lap cholecystectomy in 10/2018 by Dr. Ebbie complicated by postoperative bile leak, blood transfusion  Urinary Symptoms: {urine leakage?:24754} Leaks *** time(s) per {days/wks/mos/yrs:310907}.  Pad use: {NUMBERS 1-10:18281} {pad option:24752} per day.   Patient {ACTION; IS/IS WNU:78978602} bothered by UI symptoms.  Day time voids ***.  Nocturia: *** times per night to void. Voiding dysfunction:  {empties:24755} bladder well.  Patient {DOES NOT does:27190::does not} use a catheter to empty bladder.  When urinating, patient feels {urine symptoms:24756} Drinks: *** per day  UTIs: {NUMBERS 1-10:18281} UTI's in the last year.   {ACTIONS;DENIES/REPORTS:21021675::Denies} history of {urologic concerns:24757} No results found for the last 90 days.   Pelvic Organ Prolapse Symptoms:                  Patient {denies/ admits to:24761} a feeling of a bulge the vaginal area. It has been present for {NUMBER 1-10:22536} {days/wks/mos/yrs:310907}.  Patient {denies/ admits to:24761} seeing a bulge.  This bulge {ACTION; IS/IS WNU:78978602} bothersome.  Bowel Symptom: Bowel movements: *** time(s) per {Time; day/week/month:13537} Stool consistency: {stool consistency:24758} Straining: {yes/no:19897}.  Splinting: {yes/no:19897}.  Incomplete evacuation: {yes/no:19897}.  Patient {denies/ admits to:24761} accidental bowel leakage / fecal incontinence  Occurs: *** time(s) per {Time; day/week/month:13537}  Consistency with leakage:  {stool consistency:24758} Bowel regimen: {bowel regimen:24759} Last colonoscopy: Results *** [facility] Colonoscopy          Upcoming     Colonoscopy (Every 5 Years) Next due on 10/21/2026    10/20/2021  COLONOSCOPY   Only the first 1 history entries have been loaded, but more history exists.                Sexual Function Sexually active: yes.  Sexual orientation: {Sexual [phone]} Pain with sex: {pain with sex:24762}  Pelvic Pain {denies/ admits to:24761} pelvic pain Location: *** Pain occurs: *** Prior pain treatment: *** Improved by: *** Worsened by: ***   Past Medical History:  Past Medical History:  Diagnosis Date   Aortic atherosclerosis (HCC) 01/31/2023   ASYMPTOMATIC POSTMENOPAUSAL STATUS    Blood transfusion without reported diagnosis 1980's   Female cystocele    Gall stones, common bile duct    dialated bile duct per US  called today 11-13-2018   GERD    GERD (gastroesophageal reflux disease)    History of irregular heartbeat    HYPERLIPIDEMIA    HYPERTENSION    Left atrial enlargement 12/2013   Noted on ECHO   LVH (left ventricular hypertrophy) 12/2013   Mild, noted on ECHO   OBESITY    SCOLIOSIS    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, CXR: No definite evidence to suggest active intrathoracic tuberculosis    UMBILICAL HERNIORRHAPHY, HX OF    age 73   Vitamin D  deficiency    Wears glasses      Past Surgical History:   Past Surgical History:  Procedure Laterality Date   BILIARY STENT PLACEMENT  11/30/2018   Procedure: BILIARY STENT PLACEMENT;  Surgeon: Aneita Gwendlyn DASEN, MD;  Location[facility] ENDOSCOPY;  Service: Endoscopy;;   CHOLECYSTECTOMY N/A 11/26/2018  Procedure: LAPAROSCOPIC CHOLECYSTECTOMY WITH INTRAOPERATIVE CHOLANGIOGRAM;  Surgeon: Ebbie Cough, MD;  Location: [facility] OR;  Service: General;  Laterality: N/A;   COLONOSCOPY  2012   Tics   COLONOSCOPY  10/2015   ECTOPIC PREGNANCY SURGERY  1983   ENDOSCOPIC  RETROGRADE CHOLANGIOPANCREATOGRAPHY (ERCP) WITH PROPOFOL  N/A 11/17/2018   Procedure: ENDOSCOPIC RETROGRADE CHOLANGIOPANCREATOGRAPHY (ERCP) WITH PROPOFOL ;  Surgeon: Abran Norleen SAILOR, MD;  Location: [facility] ENDOSCOPY;  Service: Endoscopy;  Laterality: N/A;   ENDOSCOPIC RETROGRADE CHOLANGIOPANCREATOGRAPHY (ERCP) WITH PROPOFOL  N/A 11/30/2018   Procedure: ENDOSCOPIC RETROGRADE CHOLANGIOPANCREATOGRAPHY (ERCP) WITH PROPOFOL ;  Surgeon: Aneita Gwendlyn DASEN, MD;  Location: [facility] ENDOSCOPY;  Service: Endoscopy;  Laterality: N/A;   ERCP N/A 01/22/2019   Procedure: ENDOSCOPIC RETROGRADE CHOLANGIOPANCREATOGRAPHY (ERCP);  Surgeon: Abran Norleen SAILOR, MD;  Location: [facility];  Service: Endoscopy;  Laterality: N/A;   REMOVAL OF STONES  11/17/2018   Procedure: REMOVAL OF STONES;  Surgeon: Abran Norleen SAILOR, MD;  Location: [facility] ENDOSCOPY;  Service: Endoscopy;;   REMOVAL OF STONES  11/30/2018   Procedure: REMOVAL OF STONES;  Surgeon: Aneita Gwendlyn DASEN, MD;  Location: [facility] ENDOSCOPY;  Service: Endoscopy;;   SPHINCTEROTOMY  11/17/2018   Procedure: ANNETT;  Surgeon: Abran Norleen SAILOR, MD;  Location: [facility] ENDOSCOPY;  Service: Endoscopy;;   UMBILICAL HERNIA REPAIR  1963   UPPER GASTROINTESTINAL ENDOSCOPY  10/2015   VAGINAL HYSTERECTOMY  1992     Past OB/GYN History: OB History  No obstetric history on file.    Vaginal deliveries: ***,  Forceps/ Vacuum deliveries: ***, Cesarean section: *** Menopausal: Yes, at age ***, {denies/ admits to:24761} vaginal bleeding since menopause Contraception: s/p menopause 12/2017 and hysterectomy. Last pap smear was2/15/24 ***.  Any history of abnormal pap smears: yes. No results found for: DIAGPAP, HPVHIGH, ADEQPAP  Medications: Patient has a current medication list which includes the following prescription(s): acetaminophen , candesartan , cholecalciferol , esomeprazole , hydrochlorothiazide , metoprolol  succinate, potassium chloride  sa, probiotic product, and ascorbic acid .   Allergies: Patient has no  known allergies.   Social History:  Social History   Tobacco Use   Smoking status: Never   Smokeless tobacco: Never  Vaping Use   Vaping status: Never Used  Substance Use Topics   Alcohol use: Yes    Alcohol/week: 0.0 standard drinks of alcohol    Comment: occas wine   Drug use: No    Relationship status: {relationship status:24764} Patient lives with ***.   Patient {ACTION; IS/IS WNU:78978602} employed ***. Regular exercise: {Yes/No:304960894} History of abuse: {Yes/No:304960894}  Family History:   Family History  Problem Relation Age of Onset   Breast cancer Mother    Diabetes Mother 75   Stroke Father    Heart disease Father    Colon cancer Father        not sure of age dx   Stroke Brother    Diabetes Brother    Sarcoidosis Brother    Heart attack Brother    Hypertension Brother    Colon cancer Paternal Aunt    Diabetes Other    Hypertension Other    Hyperlipidemia Other    Esophageal cancer Neg Hx    Pancreatic cancer Neg Hx    Rectal cancer Neg Hx    Stomach cancer Neg Hx    Colon polyps Neg Hx      Review of Systems: ROS   OBJECTIVE Physical Exam: There were no vitals filed for this visit.  Physical Exam Constitutional:      General: She is not in acute distress.    Appearance: Normal  appearance.  Genitourinary:     Bladder and urethral meatus normal.     No lesions in the vagina.     Right Labia: No rash, tenderness, lesions, skin changes or Bartholin's cyst.    Left Labia: No tenderness, lesions, skin changes, Bartholin's cyst or rash.    No vaginal discharge, erythema, tenderness, bleeding, ulceration or granulation tissue.      Right Adnexa: not tender, not full and no mass present.    Left Adnexa: not tender, not full and no mass present.    Cervix is absent.     Uterus is absent.     Urethral meatus caruncle not present.    No urethral prolapse, tenderness, mass, hypermobility or discharge present.     Bladder is not tender, urgency  on palpation not present and masses not present.      Levator ani not tender, obturator internus not tender, no asymmetrical contractions present and no pelvic spasms present.    Anal wink present and BC reflex present. Cardiovascular:     Rate and Rhythm: Normal rate.  Pulmonary:     Effort: Pulmonary effort is normal. No respiratory distress.  Abdominal:     General: There is no distension.     Palpations: There is no mass.     Tenderness: There is no abdominal tenderness.     Hernia: No hernia is present.  Neurological:     Mental Status: She is alert.  Vitals reviewed. Exam conducted with a chaperone present.      POP-Q:   POP-Q                                               Aa                                               Ba                                                 C                                                Gh                                               Pb                                               tvl                                                Ap  Bp                                                 D      Rectal Exam:  Normal sphincter tone, {rectocele:24766} distal rectocele, enterocoele {DESC; PRESENT/NOT PRESENT:21021351}, no rectal masses, {sign of:24767} dyssynergia when asking the patient to bear down.  Post-Void Residual (PVR) by Bladder Scan: In order to evaluate bladder emptying, we discussed obtaining a postvoid residual and patient agreed to this procedure.  Procedure: The ultrasound unit was placed on the patient's abdomen in the suprapubic region after the patient had voided.      Laboratory Results: Lab Results  Component Value Date   BILIRUBINUR NEGATIVE 11/26/2018   PROTEINUR NEGATIVE 11/26/2018   UROBILINOGEN 0.2 10/17/2014   LEUKOCYTESUR LARGE (A) 11/26/2018    Lab Results  Component Value Date   CREATININE 0.90 11/09/2021   CREATININE 0.87 11/03/2020    CREATININE 0.98 12/02/2018    Lab Results  Component Value Date   HGBA1C 4.9 11/03/2020    Lab Results  Component Value Date   HGB 12.1 11/09/2021     ASSESSMENT AND PLAN Ms. Mathews-Bethea is a 68 y.o. with: No diagnosis found.  There are no diagnoses linked to this encounter.   Janice ONEIDA Gillis, MD

## 2024-01-13 ENCOUNTER — Ambulatory Visit: Admitting: Obstetrics

## 2024-01-13 ENCOUNTER — Encounter: Payer: Self-pay | Admitting: Obstetrics

## 2024-01-13 ENCOUNTER — Other Ambulatory Visit: Payer: Self-pay

## 2024-01-13 ENCOUNTER — Other Ambulatory Visit (HOSPITAL_COMMUNITY): Payer: Self-pay

## 2024-01-13 ENCOUNTER — Other Ambulatory Visit: Payer: Self-pay | Admitting: Internal Medicine

## 2024-01-13 VITALS — BP 153/87 | HR 60 | Ht 60.32 in | Wt 177.8 lb

## 2024-01-13 DIAGNOSIS — Z90710 Acquired absence of both cervix and uterus: Secondary | ICD-10-CM

## 2024-01-13 DIAGNOSIS — R3914 Feeling of incomplete bladder emptying: Secondary | ICD-10-CM | POA: Diagnosis not present

## 2024-01-13 DIAGNOSIS — N952 Postmenopausal atrophic vaginitis: Secondary | ICD-10-CM | POA: Diagnosis not present

## 2024-01-13 DIAGNOSIS — R350 Frequency of micturition: Secondary | ICD-10-CM | POA: Diagnosis not present

## 2024-01-13 DIAGNOSIS — N8110 Cystocele, unspecified: Secondary | ICD-10-CM | POA: Diagnosis not present

## 2024-01-13 LAB — POCT URINALYSIS DIP (CLINITEK)
Bilirubin, UA: NEGATIVE
Blood, UA: NEGATIVE
Glucose, UA: NEGATIVE mg/dL
Ketones, POC UA: NEGATIVE mg/dL
Nitrite, UA: NEGATIVE
POC PROTEIN,UA: NEGATIVE
Spec Grav, UA: 1.015 (ref 1.010–1.025)
Urobilinogen, UA: 0.2 U/dL
pH, UA: 7.5 (ref 5.0–8.0)

## 2024-01-13 MED ORDER — ESTRADIOL 0.1 MG/GM VA CREA
0.5000 g | TOPICAL_CREAM | VAGINAL | 3 refills | Status: AC
Start: 2024-01-16 — End: ?
  Filled 2024-01-13: qty 42.5, 90d supply, fill #0

## 2024-01-13 MED ORDER — ESOMEPRAZOLE MAGNESIUM 40 MG PO CPDR
40.0000 mg | DELAYED_RELEASE_CAPSULE | Freq: Two times a day (BID) | ORAL | 0 refills | Status: DC
  Filled 2024-01-13: qty 180, 90d supply, fill #0

## 2024-01-13 NOTE — Patient Instructions (Addendum)
 You have a stage 3 (out of 4) prolapse.  We discussed the fact that it is not life threatening but there are several treatment options. For treatment of pelvic organ prolapse, we discussed options for management including expectant management, conservative management, and surgical management, such as Kegels, a pessary, pelvic floor physical therapy, and specific surgical procedures.     Please call [phone] to schedule the earliest appointment for pelvic floor PT.  Focus on relaxation during bowel movements in squatting position.   For vaginal atrophy (thinning of the vaginal tissue that can cause dryness and burning) and UTI prevention we discussed estrogen replacement in the form of vaginal cream.   Start vaginal estrogen therapy nightly for two weeks then 2 times weekly at night. This can be placed with your finger or an applicator inside the vagina and around the urethra.  Please let us  know if the prescription is too expensive and we can look for alternative options.   Is vaginal estrogen therapy safe for me? Vaginal estrogen preparations act on the vaginal skin, and only a very tiny amount is absorbed into the bloodstream (0.01%).  They work in a similar way to hand or face cream.  There is minimal absorption and they are therefore perfectly safe. If you have had breast cancer and have persistent troublesome symptoms which aren't settling with vaginal moisturisers and lubricants, local estrogen treatment may be a possibility, but consultation with your oncologist should take place first.   Women should try to eat at least 21 to 25 grams of fiber a day, while men should aim for 30 to 38 grams a day. You can add fiber to your diet with food or a fiber supplement such as psyllium (metamucil), benefiber, or fibercon.   Here's a look at how much dietary fiber is found in some common foods. When buying packaged foods, check the Nutrition Facts label for fiber content. It can vary among  brands.  Fruits Serving size Total fiber (grams)*  Raspberries 1 cup 8.0  Pear 1 medium 5.5  Apple, with skin 1 medium 4.5  Banana 1 medium 3.0  Orange 1 medium 3.0  Strawberries 1 cup 3.0   Vegetables Serving size Total fiber (grams)*  Green peas, boiled 1 cup 9.0  Broccoli, boiled 1 cup chopped 5.0  Turnip greens, boiled 1 cup 5.0  Brussels sprouts, boiled 1 cup 4.0  Potato, with skin, baked 1 medium 4.0  Sweet corn, boiled 1 cup 3.5  Cauliflower, raw 1 cup chopped 2.0  Carrot, raw 1 medium 1.5   Grains Serving size Total fiber (grams)*  Spaghetti, whole-wheat, cooked 1 cup 6.0  Barley, pearled, cooked 1 cup 6.0  Bran flakes 3/4 cup 5.5  Quinoa, cooked 1 cup 5.0  Oat bran muffin 1 medium 5.0  Oatmeal, instant, cooked 1 cup 5.0  Popcorn, air-popped 3 cups 3.5  Brown rice, cooked 1 cup 3.5  Bread, whole-wheat 1 slice 2.0  Bread, rye 1 slice 2.0   Legumes, nuts and seeds Serving size Total fiber (grams)*  Split peas, boiled 1 cup 16.0  Lentils, boiled 1 cup 15.5  Black beans, boiled 1 cup 15.0  Baked beans, canned 1 cup 10.0  Chia seeds 1 ounce 10.0  Almonds 1 ounce (23 nuts) 3.5  Pistachios 1 ounce (49 nuts) 3.0  Sunflower kernels 1 ounce 3.0  *Rounded to nearest 0.5 gram. Source: [employer] for [employer]	[place]

## 2024-01-15 DIAGNOSIS — R350 Frequency of micturition: Secondary | ICD-10-CM | POA: Insufficient documentation

## 2024-01-15 NOTE — Assessment & Plan Note (Addendum)
-   bladder scan 13mL, catheterized for - repeat if clinical change - encouraged splinting to reassess symptoms

## 2024-01-15 NOTE — Assessment & Plan Note (Addendum)
-   tried pelvic floor PT in 2021 - For treatment of pelvic organ prolapse, we discussed options for management including expectant management, conservative management, and surgical management, such as Kegels, a pessary, pelvic floor physical therapy, and specific surgical procedures. - pt desires to repeat pelvic floor PT with referral sent - encouraged squatting position for defecation and fiber supplementation for stool consistency

## 2024-01-15 NOTE — Assessment & Plan Note (Signed)
-   minimal bother - We discussed the symptoms of overactive bladder (OAB), which include urinary urgency, urinary frequency, nocturia, with or without urge incontinence.  While we do not know the exact etiology of OAB, several treatment options exist. We discussed management including behavioral therapy (decreasing bladder irritants, urge suppression strategies, timed voids, bladder retraining), physical therapy, medication; for refractory cases posterior tibial nerve stimulation, sacral neuromodulation, and intravesical botulinum toxin injection.  For anticholinergic medications, we discussed the potential side effects of anticholinergics including dry eyes, dry mouth, constipation, cognitive impairment and urinary retention. For Beta-3 agonist medication, we discussed the potential side effect of elevated blood pressure which is more likely to occur in individuals with uncontrolled hypertension. - encouraged bladder training and weight reduction - referral sent for pelvic floor PT - declines medications at this time

## 2024-01-16 NOTE — Assessment & Plan Note (Signed)
-   For symptomatic vaginal atrophy options include lubrication with a water-based lubricant, personal hygiene measures and barrier protection against wetness, and estrogen replacement in the form of vaginal cream, vaginal tablets, or a time-released vaginal ring.   - Rx to start low dose vaginal estrogen

## 2024-01-27 DIAGNOSIS — M47812 Spondylosis without myelopathy or radiculopathy, cervical region: Secondary | ICD-10-CM | POA: Diagnosis not present

## 2024-01-27 DIAGNOSIS — Z1330 Encounter for screening examination for mental health and behavioral disorders, unspecified: Secondary | ICD-10-CM | POA: Diagnosis not present

## 2024-01-27 DIAGNOSIS — M419 Scoliosis, unspecified: Secondary | ICD-10-CM | POA: Diagnosis not present

## 2024-01-27 DIAGNOSIS — M47816 Spondylosis without myelopathy or radiculopathy, lumbar region: Secondary | ICD-10-CM | POA: Diagnosis not present

## 2024-02-03 ENCOUNTER — Other Ambulatory Visit: Payer: Self-pay | Admitting: Cardiovascular Disease

## 2024-02-03 ENCOUNTER — Other Ambulatory Visit (HOSPITAL_COMMUNITY): Payer: Self-pay

## 2024-02-03 MED ORDER — POTASSIUM CHLORIDE CRYS ER 20 MEQ PO TBCR
20.0000 meq | EXTENDED_RELEASE_TABLET | Freq: Every day | ORAL | 0 refills | Status: DC
  Filled 2024-02-03: qty 30, 30d supply, fill #0

## 2024-02-03 MED ORDER — METOPROLOL SUCCINATE ER 50 MG PO TB24
75.0000 mg | ORAL_TABLET | Freq: Every day | ORAL | 0 refills | Status: DC
  Filled 2024-02-03: qty 60, 40d supply, fill #0

## 2024-02-09 ENCOUNTER — Other Ambulatory Visit (HOSPITAL_COMMUNITY): Payer: Self-pay

## 2024-02-09 ENCOUNTER — Other Ambulatory Visit: Payer: Self-pay | Admitting: Cardiovascular Disease

## 2024-02-10 ENCOUNTER — Other Ambulatory Visit (HOSPITAL_COMMUNITY): Payer: Self-pay

## 2024-02-10 MED ORDER — CANDESARTAN CILEXETIL 16 MG PO TABS
16.0000 mg | ORAL_TABLET | Freq: Every day | ORAL | 0 refills | Status: DC
  Filled 2024-02-10: qty 15, 15d supply, fill #0

## 2024-02-12 ENCOUNTER — Other Ambulatory Visit: Payer: Self-pay | Admitting: Cardiovascular Disease

## 2024-02-13 ENCOUNTER — Other Ambulatory Visit (HOSPITAL_COMMUNITY): Payer: Self-pay

## 2024-02-14 ENCOUNTER — Other Ambulatory Visit (HOSPITAL_COMMUNITY): Payer: Self-pay

## 2024-02-14 MED ORDER — HYDROCHLOROTHIAZIDE 12.5 MG PO CAPS
12.5000 mg | ORAL_CAPSULE | Freq: Every morning | ORAL | 0 refills | Status: DC
  Filled 2024-02-14: qty 30, 30d supply, fill #0

## 2024-03-01 ENCOUNTER — Other Ambulatory Visit: Payer: Self-pay | Admitting: Cardiovascular Disease

## 2024-03-01 ENCOUNTER — Other Ambulatory Visit (HOSPITAL_COMMUNITY): Payer: Self-pay

## 2024-03-01 DIAGNOSIS — M419 Scoliosis, unspecified: Secondary | ICD-10-CM | POA: Diagnosis not present

## 2024-03-01 DIAGNOSIS — M47816 Spondylosis without myelopathy or radiculopathy, lumbar region: Secondary | ICD-10-CM | POA: Diagnosis not present

## 2024-03-01 DIAGNOSIS — M47812 Spondylosis without myelopathy or radiculopathy, cervical region: Secondary | ICD-10-CM | POA: Diagnosis not present

## 2024-03-01 MED ORDER — CANDESARTAN CILEXETIL 16 MG PO TABS
16.0000 mg | ORAL_TABLET | Freq: Every day | ORAL | 1 refills | Status: DC
Start: 2024-03-01 — End: 2024-04-25
  Filled 2024-03-01: qty 30, 30d supply, fill #0
  Filled 2024-03-27: qty 30, 30d supply, fill #1

## 2024-03-01 MED ORDER — POTASSIUM CHLORIDE CRYS ER 20 MEQ PO TBCR
20.0000 meq | EXTENDED_RELEASE_TABLET | Freq: Every day | ORAL | 1 refills | Status: DC
  Filled 2024-03-01: qty 30, 30d supply, fill #0
  Filled 2024-04-03: qty 30, 30d supply, fill #1

## 2024-03-11 ENCOUNTER — Other Ambulatory Visit: Payer: Self-pay | Admitting: Cardiovascular Disease

## 2024-03-12 ENCOUNTER — Ambulatory Visit: Attending: Obstetrics | Admitting: Physical Therapy

## 2024-03-12 ENCOUNTER — Other Ambulatory Visit: Payer: Self-pay

## 2024-03-12 ENCOUNTER — Other Ambulatory Visit (HOSPITAL_COMMUNITY): Payer: Self-pay

## 2024-03-12 DIAGNOSIS — R279 Unspecified lack of coordination: Secondary | ICD-10-CM | POA: Insufficient documentation

## 2024-03-12 DIAGNOSIS — M6281 Muscle weakness (generalized): Secondary | ICD-10-CM | POA: Insufficient documentation

## 2024-03-12 DIAGNOSIS — R293 Abnormal posture: Secondary | ICD-10-CM | POA: Insufficient documentation

## 2024-03-12 DIAGNOSIS — M62838 Other muscle spasm: Secondary | ICD-10-CM | POA: Insufficient documentation

## 2024-03-12 MED ORDER — METOPROLOL SUCCINATE ER 50 MG PO TB24
75.0000 mg | ORAL_TABLET | Freq: Every day | ORAL | 1 refills | Status: DC
  Filled 2024-03-12: qty 45, 30d supply, fill #0
  Filled 2024-04-12: qty 45, 30d supply, fill #1

## 2024-03-12 NOTE — Patient Instructions (Signed)
  15-20 mins in the evenings based on tolerance.  Please clear any positions with doctor as needed.  Please stop any position if negative symptoms occur (dizziness/lightheadedness/fatigue/increased pain, etc.)  

## 2024-03-12 NOTE — Therapy (Signed)
 OUTPATIENT PHYSICAL THERAPY FEMALE PELVIC EVALUATION   Patient Name: Janice Rodriguez MRN: [id number] DOB:1956-03-04, 68 y.o., female Today's Date: 03/12/2024  END OF SESSION:  PT End of Session - 03/12/24 1403     Visit Number 1    Date for PT Re-Evaluation 09/09/24    Authorization Type [employer]    PT Start Time 1400    PT Stop Time 1441    PT Time Calculation (min) 41 min    Activity Tolerance Patient tolerated treatment well    Behavior During Therapy WFL for tasks assessed/performed          Past Medical History:  Diagnosis Date   Aortic atherosclerosis (HCC) 01/31/2023   ASYMPTOMATIC POSTMENOPAUSAL STATUS    Blood transfusion without reported diagnosis 1980's   Female cystocele    Gall stones, common bile duct    dialated bile duct per US  called today 11-13-2018   GERD    GERD (gastroesophageal reflux disease)    History of irregular heartbeat    HYPERLIPIDEMIA    HYPERTENSION    Left atrial enlargement 12/2013   Noted on ECHO   LVH (left ventricular hypertrophy) 12/2013   Mild, noted on ECHO   OBESITY    SCOLIOSIS    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, CXR: No definite evidence to suggest active intrathoracic tuberculosis    UMBILICAL HERNIORRHAPHY, HX OF    age 56   Vitamin D  deficiency    Wears glasses    Past Surgical History:  Procedure Laterality Date   BILIARY STENT PLACEMENT  11/30/2018   Procedure: BILIARY STENT PLACEMENT;  Surgeon: Aneita Gwendlyn DASEN, MD;  Location: [facility] ENDOSCOPY;  Service: Endoscopy;;   CHOLECYSTECTOMY N/A 11/26/2018   Procedure: LAPAROSCOPIC CHOLECYSTECTOMY WITH INTRAOPERATIVE CHOLANGIOGRAM;  Surgeon: Ebbie Cough, MD;  Location: [facility] OR;  Service: General;  Laterality: N/A;   COLONOSCOPY  2012   Tics   COLONOSCOPY  10/2015   ECTOPIC PREGNANCY SURGERY  1983   ENDOSCOPIC RETROGRADE CHOLANGIOPANCREATOGRAPHY (ERCP) WITH PROPOFOL  N/A 11/17/2018   Procedure: ENDOSCOPIC RETROGRADE  CHOLANGIOPANCREATOGRAPHY (ERCP) WITH PROPOFOL ;  Surgeon: Abran Norleen SAILOR, MD;  Location: [facility] ENDOSCOPY;  Service: Endoscopy;  Laterality: N/A;   ENDOSCOPIC RETROGRADE CHOLANGIOPANCREATOGRAPHY (ERCP) WITH PROPOFOL  N/A 11/30/2018   Procedure: ENDOSCOPIC RETROGRADE CHOLANGIOPANCREATOGRAPHY (ERCP) WITH PROPOFOL ;  Surgeon: Aneita Gwendlyn DASEN, MD;  Location: [facility] ENDOSCOPY;  Service: Endoscopy;  Laterality: N/A;   ERCP N/A 01/22/2019   Procedure: ENDOSCOPIC RETROGRADE CHOLANGIOPANCREATOGRAPHY (ERCP);  Surgeon: Abran Norleen SAILOR, MD;  Location: [facility] ENDOSCOPY;  Service: Endoscopy;  Laterality: N/A;   REMOVAL OF STONES  11/17/2018   Procedure: REMOVAL OF STONES;  Surgeon: Abran Norleen SAILOR, MD;  Location: [facility] ENDOSCOPY;  Service: Endoscopy;;   REMOVAL OF STONES  11/30/2018   Procedure: REMOVAL OF STONES;  Surgeon: Aneita Gwendlyn DASEN, MD;  Location[facility] ENDOSCOPY;  Service: Endoscopy;;   SPHINCTEROTOMY  11/17/2018   Procedure: ANNETT;  Surgeon: Abran Norleen SAILOR, MD;  Location: [facility] ENDOSCOPY;  Service: Endoscopy;;   UMBILICAL HERNIA REPAIR  1963   UPPER GASTROINTESTINAL ENDOSCOPY  10/2015   VAGINAL HYSTERECTOMY  1992   Patient Active Problem List   Diagnosis Date Noted   Urinary frequency 01/15/2024   Feeling of incomplete bladder emptying 01/13/2024   Vaginal atrophy 01/13/2024   Pelvic organ prolapse quantification stage 3 cystocele 01/13/2024   Aortic atherosclerosis (HCC) 01/31/2023   Bile leak    Bile leak, postoperative    Cholangitis 11/25/2018   AKI (acute kidney injury) (HCC)  11/25/2018   Bile duct stone    Other hyperlipidemia 03/30/2017   Routine general medical examination at a health care facility 11/21/2015   Vitamin D  deficiency 10/17/2014   Palpitations 11/05/2013   Moderate obesity 07/08/2010   Essential hypertension 06/24/2010   GERD 06/24/2010   Idiopathic scoliosis and kyphoscoliosis 06/24/2010    PCP: Gerome Brunet, DO   REFERRING PROVIDER: Guadlupe Lianne DASEN, MD   REFERRING DIAG: R39.14  (ICD-10-CM) - Feeling of incomplete bladder emptying N81.10 (ICD-10-CM) - Pelvic organ prolapse quantification stage 3 cystocele  THERAPY DIAG:  Muscle weakness (generalized)  Abnormal posture  Other muscle spasm  Unspecified lack of coordination  Rationale for Evaluation and Treatment: Rehabilitation  ONSET DATE: several years  SUBJECTIVE:                                                                                                                                                                                           SUBJECTIVE STATEMENT: Has had prolapse for the last several years and has scoliosis curve has gotten worse prolapse has worsened. Pt reports difficulty emptying bladder needs to sit for a few minutes to fully empty.   Fluid intake:   FUNCTIONAL LIMITATIONS: pushing husband's wheelchair, lifting (dog 23# and assists husband sometimes), carrying groceries  PERTINENT HISTORY:  Medications for current condition: estrogen cream Surgeries: vaginal hysterectomy 1992 Other:  Sexual abuse: No  DIAGNOSTIC FINDINGS:  Post-void residual: Voiding Cystourethrogram (VCUG):  Ultrasound: PAIN:  Are you having pain? Yes NPRS scale: 0-6/10, 0 currently Pain location: low back  Pain type: aching and dull Pain description: intermittent   Aggravating factors: higher activity, lifting  Relieving factors:  heat, resting, tylenol  sometimes  PRECAUTIONS: None  RED FLAGS: None   WEIGHT BEARING RESTRICTIONS: No  FALLS:  Has patient fallen in last 6 months? No  OCCUPATION: retired   ACTIVITY LEVEL : low-moderate   PLOF: Independent  PATIENT GOALS: to decreased prolapse to avoid surgery     BOWEL MOVEMENT: Pain with bowel movement: No Type of bowel movement:Type (Bristol Stool Scale) 4, Frequency daily, and Strain no Fully empty rectum: Yes:   Leakage: No                                                      Caused by: na Pads: No Fiber supplement/laxative  No  URINATION: Pain with urination: No Fully empty bladder: Yes:  but needs to sit several mins  Post-void dribble: No Stream: Weak Urgency: sometimes Frequency:during the day every time I drink something, usually 1.5-2  Nocturia: Nosometimes but but not always   Leakage: none Pads/briefs: No  INTERCOURSE:  Ability to have vaginal penetration Yes  Pain with intercourse: none Dryness: Yes but uses lubricant  Climax: not painful Marinoff Scale: 0/3 Lubricant:yes  PREGNANCY: Vaginal deliveries 3 Tearing No (doesn't think so) Episiotomy Yes (third) C-section deliveries 0 Currently pregnant No  PROLAPSE: Pressure vaginally with lifting, straining    OBJECTIVE:  Note: Objective measures were completed at Evaluation unless otherwise noted.  DIAGNOSTIC FINDINGS:     COGNITION: Overall cognitive status: Within functional limits for tasks assessed     SENSATION: Light touch: Appears intact   FUNCTIONAL TESTS:   Single leg stance: unable to complete due to poor balance  Rt:0  Lt:0 Sit-up test:0/3  GAIT: WFL  POSTURE: rounded shoulders, forward head, posterior pelvic tilt, and Rt rib hump and Lt lateral positioning of pelvis pt with scoliosis    LUMBARAROM/PROM:  A/PROM A/PROM  Eval   Flexion Limited by 50%  Extension Limited by 25%  Right lateral flexion Limited by75%  Left lateral flexion Limited by 50%  Right rotation Limited by75%  Left rotation Limited by 25%   (Blank rows = not tested)  LOWER EXTREMITY ROM:  Bil hamstrings and adductors limited by 50%  LOWER EXTREMITY MMT: Bil hip flexion, extension 3+/5, abduction and adduction 3/5 PALPATION:  General: tightness throughout bil paraspinals   Pelvic Alignment: scoliosis with Rt pelvic hike and Lt lateral  Abdominal: tightness in lower quadrants  Diastasis: to be assessed  Distortion:   Breathing: chest breathing and holds breath with MMTs                  External Perineal Exam: no pain can visualize tissue laxity at vaginal opening                              Internal Pelvic Floor: no pain  Patient confirms identification and approves PT to assess internal pelvic floor and treatment Yes No emotional/communication barriers or cognitive limitation. Patient is motivated to learn. Patient understands and agrees with treatment goals and plan. PT explains patient will be examined in standing, sitting, and lying down to see how their muscles and joints work. When they are ready, they will be asked to remove their underwear so PT can examine their perineum. The patient is also given the option of providing their own chaperone as one is not provided in our facility. The patient also has the right and is explained the right to defer or refuse any part of the evaluation or treatment including the internal exam. With the patient's consent, PT will use one gloved finger to gently assess the muscles of the pelvic floor, seeing how well it contracts and relaxes and if there is muscle symmetry. After, the patient will get dressed and PT and patient will discuss exam findings and plan of care. PT and patient discuss plan of care, schedule, attendance policy and HEP activities.  PELVIC MMT:   MMT eval  Vaginal 3/5, 4s, 3 reps  Internal Anal Sphincter   External Anal Sphincter   Puborectalis   Diastasis Recti   (Blank rows = not tested)        TONE: Decreased   PROLAPSE: Possible grade 3 anterior vaginal wall laxity in hooklying with cough (does worsen to protrude out of vaginal  with coughing) tested with pelvic floor activation and no protrusion with cough  TODAY'S TREATMENT:                                                                                                                              DATE:   03/12/24 EVAL Examination completed, findings reviewed, pt educated on POC, HEP, and prolapse relief positions. Pt motivated to participate in PT and  agreeable to attempt recommendations.     PATIENT EDUCATION:  Education details: [id number] Person educated: Patient Education method: Explanation, Demonstration, Tactile cues, Verbal cues, and Handouts Education comprehension: verbalized understanding, returned demonstration, verbal cues required, tactile cues required, and needs further education  HOME EXERCISE PROGRAM: [id number]  ASSESSMENT:  CLINICAL IMPRESSION: Patient is a 69 y.o. female  who was seen today for physical therapy evaluation and treatment for prolapse and incomplete bladder emptying. Pt assists in care giving for husband and does lift her ~23# dog regularly and notes worsening of prolapse with all lifting, squatting, carrying, pushing, straining and at end of day. Pt found to have decreased core and hip strength, decreased standing balance, decreased pelvic stability, impaired posture with noted scoliosis, chest breathing, decreased flexibility in spine and bil hips, abdominal fascial restrictions. Patient consented to internal pelvic floor assessment vaginally this date and found to have decreased strength, endurance, and coordination. Pt would benefit from additional PT to further address deficits.       OBJECTIVE IMPAIRMENTS: decreased activity tolerance, decreased coordination, decreased endurance, decreased mobility, decreased ROM, decreased strength, increased fascial restrictions, impaired perceived functional ability, increased muscle spasms, impaired flexibility, improper body mechanics, and postural dysfunction.   ACTIVITY LIMITATIONS: carrying, lifting, standing, squatting, stairs, transfers, continence, locomotion level, and caring for others  PARTICIPATION LIMITATIONS: meal prep, cleaning, laundry, shopping, community activity, and yard work  PERSONAL FACTORS: Fitness, Time since onset of injury/illness/exacerbation, and 1 comorbidity: medical history are also affecting patient's functional outcome.   REHAB  POTENTIAL: Good  CLINICAL DECISION MAKING: Stable/uncomplicated  EVALUATION COMPLEXITY: Low   GOALS: Goals reviewed with patient? Yes  SHORT TERM GOALS: Target date: 04/09/24  Pt to be I with HEP for carry over and continuing recommendations for improved outcomes.   Baseline: Goal status: INITIAL  2.  Pt will be able to correctly perform diaphragmatic breathing and appropriate pressure management in order to prevent worsening vaginal wall laxity and improve pelvic floor A/ROM.   Baseline:  Goal status: INITIAL  3.  Pt will be independent with the knack, urge suppression technique, and double voiding in order to improve bladder habits and decrease urinary incontinence.    Baseline:  Goal status: INITIAL  4.  Pt to demonstrate I with pelvic floor coordination with breathing mechanics for proper full mobility of pelvic floor without straining to prevent worsening of laxity at pelvic floor.  Baseline:  Goal status: INITIAL  LONG TERM GOALS: Target date: 09/09/24  Pt to be I with advanced HEP for carry over  and continuing recommendations for improved outcomes.   Baseline:  Goal status: INITIAL  2.  Pt to demonstrate improved coordination of pelvic floor and breathing mechanics with 10# squat with appropriate synergistic patterns to decrease pain and leakage at least 75% of the time for improved ability to complete a 30 minute walk without strain at pelvic floor and symptoms.    Baseline:  Goal status: INITIAL  3.  Pt to demonstrate 30 mins of improved posture to midline and upright (will still have lateral curvature due to chronic scoliosis) but improved upright/less flexion for decreased strain at prolapse.  Baseline:  Goal status: INITIAL  4.  Pt to report ability to tolerate carrying in groceries without worsening prolapse due to improved strength and coordination of core/pelvic floor.  Baseline:  Goal status: INITIAL  5.  Pt to report at least 50% reduction in prolapse  related symptoms for improved QOL and tolerance to husband's physical assistance needs.  Baseline:  Goal status: INITIAL  PLAN:  PT FREQUENCY: 1x/week  PT DURATION: 15 sessions  PLANNED INTERVENTIONS: 97110-Therapeutic exercises, 97530- Therapeutic activity, 97112- Neuromuscular re-education, 97535- Self Care, 02859- Manual therapy, [phone]- Canalith repositioning, [id number]- Aquatic Therapy, [phone]- Electrical stimulation (manual), [id number]- Vasopneumatic device, [phone] (1-2 muscles), [id number] (3+ muscles)- Dry Needling, Patient/Family education, Taping, Joint mobilization, Spinal mobilization, Scar mobilization, DME instructions, Cryotherapy, Moist heat, and Biofeedback  PLAN FOR NEXT SESSION: coordination of pelvic floor, breathing mechanics, voiding mechanics, double voiding, core and hip strengthening     Darryle Navy, PT, DPT 09/15/253:21 PM  [facility] [address] [place], [place] [address] Phone # [phone] Fax [phone]

## 2024-03-19 ENCOUNTER — Other Ambulatory Visit (HOSPITAL_COMMUNITY): Payer: Self-pay

## 2024-03-19 ENCOUNTER — Other Ambulatory Visit: Payer: Self-pay | Admitting: Cardiovascular Disease

## 2024-03-20 ENCOUNTER — Other Ambulatory Visit (HOSPITAL_COMMUNITY): Payer: Self-pay

## 2024-03-20 MED ORDER — HYDROCHLOROTHIAZIDE 12.5 MG PO CAPS
12.5000 mg | ORAL_CAPSULE | Freq: Every morning | ORAL | 0 refills | Status: DC
  Filled 2024-03-20: qty 90, 90d supply, fill #0

## 2024-03-21 ENCOUNTER — Ambulatory Visit: Admitting: Physical Therapy

## 2024-03-23 DIAGNOSIS — Z23 Encounter for immunization: Secondary | ICD-10-CM | POA: Diagnosis not present

## 2024-03-27 ENCOUNTER — Other Ambulatory Visit (HOSPITAL_COMMUNITY): Payer: Self-pay

## 2024-03-27 ENCOUNTER — Other Ambulatory Visit: Payer: Self-pay

## 2024-03-27 ENCOUNTER — Encounter: Admitting: Physical Therapy

## 2024-04-03 DIAGNOSIS — M419 Scoliosis, unspecified: Secondary | ICD-10-CM | POA: Diagnosis not present

## 2024-04-09 ENCOUNTER — Ambulatory Visit: Attending: Obstetrics | Admitting: Physical Therapy

## 2024-04-09 DIAGNOSIS — M62838 Other muscle spasm: Secondary | ICD-10-CM | POA: Insufficient documentation

## 2024-04-09 DIAGNOSIS — R279 Unspecified lack of coordination: Secondary | ICD-10-CM | POA: Insufficient documentation

## 2024-04-09 DIAGNOSIS — M6281 Muscle weakness (generalized): Secondary | ICD-10-CM | POA: Diagnosis not present

## 2024-04-09 DIAGNOSIS — R293 Abnormal posture: Secondary | ICD-10-CM | POA: Diagnosis not present

## 2024-04-09 NOTE — Therapy (Signed)
 OUTPATIENT PHYSICAL THERAPY FEMALE PELVIC EVALUATION   Patient Name: Janice Rodriguez MRN: [id number] DOB:January 24, 1956, 68 y.o., female Today's Date: 04/09/2024  END OF SESSION:  PT End of Session - 04/09/24 1534     Visit Number 2    Date for Recertification  09/09/24    Authorization Type [employer]    PT Start Time 1531    PT Stop Time 1610    PT Time Calculation (min) 39 min    Activity Tolerance Patient tolerated treatment well    Behavior During Therapy WFL for tasks assessed/performed           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, CXR: No definite evidence to suggest active intrathoracic tuberculosis    UMBILICAL HERNIORRHAPHY, HX OF    age 89   Vitamin D  deficiency    Wears glasses    Past Surgical History:  Procedure Laterality Date   BILIARY STENT PLACEMENT  11/30/2018   Procedure: BILIARY STENT PLACEMENT;  Surgeon: Aneita Gwendlyn DASEN, MD;  Location: [facility];  Service: Endoscopy;;   CHOLECYSTECTOMY N/A 11/26/2018   Procedure: LAPAROSCOPIC CHOLECYSTECTOMY WITH INTRAOPERATIVE CHOLANGIOGRAM;  Surgeon: Ebbie Cough, MD;  Location: [facility] OR;  Service: General;  Laterality: N/A;   COLONOSCOPY  2012   Tics   COLONOSCOPY  10/2015   ECTOPIC PREGNANCY SURGERY  1983   ENDOSCOPIC RETROGRADE CHOLANGIOPANCREATOGRAPHY (ERCP) WITH PROPOFOL  N/A 11/17/2018   Procedure: ENDOSCOPIC RETROGRADE  CHOLANGIOPANCREATOGRAPHY (ERCP) WITH PROPOFOL ;  Surgeon: Abran Norleen SAILOR, MD;  Location: [facility] ENDOSCOPY;  Service: Endoscopy;  Laterality: N/A;   ENDOSCOPIC RETROGRADE CHOLANGIOPANCREATOGRAPHY (ERCP) WITH PROPOFOL  N/A 11/30/2018   Procedure: ENDOSCOPIC RETROGRADE CHOLANGIOPANCREATOGRAPHY (ERCP) WITH PROPOFOL ;  Surgeon: Aneita Gwendlyn DASEN, MD;  Location: [facility] ENDOSCOPY;  Service: Endoscopy;  Laterality: N/A;   ERCP N/A 01/22/2019   Procedure: ENDOSCOPIC RETROGRADE CHOLANGIOPANCREATOGRAPHY (ERCP);  Surgeon: Abran Norleen SAILOR, MD;  Location: [facility];  Service: Endoscopy;  Laterality: N/A;   REMOVAL OF STONES  11/17/2018   Procedure: REMOVAL OF STONES;  Surgeon: Abran Norleen SAILOR, MD;  Location: [facility] ENDOSCOPY;  Service: Endoscopy;;   REMOVAL OF STONES  11/30/2018   Procedure: REMOVAL OF STONES;  Surgeon: Aneita Gwendlyn DASEN, MD;  Location: [facility] ENDOSCOPY;  Service: Endoscopy;;   SPHINCTEROTOMY  11/17/2018   Procedure: ANNETT;  Surgeon: Abran Norleen SAILOR, MD;  Location: [facility] ENDOSCOPY;  Service: Endoscopy;;   UMBILICAL HERNIA REPAIR  1963   UPPER GASTROINTESTINAL ENDOSCOPY  10/2015   VAGINAL HYSTERECTOMY  1992   Patient Active Problem List   Diagnosis Date Noted   Urinary frequency 01/15/2024   Feeling of incomplete bladder emptying 01/13/2024   Vaginal atrophy 01/13/2024   Pelvic organ prolapse quantification stage 3 cystocele 01/13/2024   Aortic atherosclerosis 01/31/2023   Bile leak    Bile leak, postoperative    Cholangitis (HCC) 11/25/2018   AKI (acute kidney injury) 11/25/2018  Bile duct stone    Other hyperlipidemia 03/30/2017   Routine general medical examination at a health care facility 11/21/2015   Vitamin D  deficiency 10/17/2014   Palpitations 11/05/2013   Moderate obesity 07/08/2010   Essential hypertension 06/24/2010   GERD 06/24/2010   Idiopathic scoliosis and kyphoscoliosis 06/24/2010    PCP: Gerome Brunet, DO   REFERRING PROVIDER: Guadlupe Lianne DASEN, MD   REFERRING DIAG: R39.14 (ICD-10-CM)  - Feeling of incomplete bladder emptying N81.10 (ICD-10-CM) - Pelvic organ prolapse quantification stage 3 cystocele  THERAPY DIAG:  Muscle weakness (generalized)  Abnormal posture  Other muscle spasm  Rationale for Evaluation and Treatment: Rehabilitation  ONSET DATE: several years  SUBJECTIVE:                                                                                                                                                                                           SUBJECTIVE STATEMENT: Pt reports relief positions were not that helpful. Bladder emptying is still variable.    Fluid intake: water - 3-4 bottles per day; coffee and tea sometimes  FUNCTIONAL LIMITATIONS: pushing husband's wheelchair, lifting (dog 23# and assists husband sometimes), carrying groceries  PERTINENT HISTORY:  Medications for current condition: estrogen cream (has not started yet) Surgeries: vaginal hysterectomy 1992 Other:  Sexual abuse: No  DIAGNOSTIC FINDINGS:  Post-void residual: Voiding Cystourethrogram (VCUG):  Ultrasound: PAIN:  Are you having pain? Yes NPRS scale: 0-6/10, 0 currently Pain location: low back  Pain type: aching and dull Pain description: intermittent   Aggravating factors: higher activity, lifting  Relieving factors:  heat, resting, tylenol  sometimes  PRECAUTIONS: None  RED FLAGS: None   WEIGHT BEARING RESTRICTIONS: No  FALLS:  Has patient fallen in last 6 months? No  OCCUPATION: retired   ACTIVITY LEVEL : low-moderate   PLOF: Independent  PATIENT GOALS: to decreased prolapse to avoid surgery     BOWEL MOVEMENT: Pain with bowel movement: No Type of bowel movement:Type (Bristol Stool Scale) 4, Frequency daily, and Strain no Fully empty rectum: Yes:   Leakage: No                                                      Caused by: na Pads: No Fiber supplement/laxative No  URINATION: Pain with urination: No Fully empty bladder: Yes:  but needs  to sit several mins  Post-void dribble: No Stream: Weak Urgency: sometimes Frequency:during the day every time I drink something, usually 1.5-2  Nocturia: Nosometimes but but not always   Leakage: none Pads/briefs: No  INTERCOURSE:  Ability to have vaginal penetration Yes  Pain with intercourse: none Dryness: Yes but uses lubricant  Climax: not painful Marinoff Scale: 0/3 Lubricant:yes  PREGNANCY: Vaginal deliveries 3 Tearing No (doesn't think so) Episiotomy Yes (third) C-section deliveries 0 Currently pregnant No  PROLAPSE: Pressure vaginally with lifting, straining    OBJECTIVE:  Note: Objective measures were completed at Evaluation unless otherwise noted.  DIAGNOSTIC FINDINGS:     COGNITION: Overall cognitive status: Within functional limits for tasks assessed     SENSATION: Light touch: Appears intact   FUNCTIONAL TESTS:   Single leg stance: unable to complete due to poor balance  Rt:0  Lt:0 Sit-up test:0/3  GAIT: WFL  POSTURE: rounded shoulders, forward head, posterior pelvic tilt, and Rt rib hump and Lt lateral positioning of pelvis pt with scoliosis    LUMBARAROM/PROM:  A/PROM A/PROM  Eval   Flexion Limited by 50%  Extension Limited by 25%  Right lateral flexion Limited by75%  Left lateral flexion Limited by 50%  Right rotation Limited by75%  Left rotation Limited by 25%   (Blank rows = not tested)  LOWER EXTREMITY ROM:  Bil hamstrings and adductors limited by 50%  LOWER EXTREMITY MMT: Bil hip flexion, extension 3+/5, abduction and adduction 3/5 PALPATION:  General: tightness throughout bil paraspinals   Pelvic Alignment: scoliosis with Rt pelvic hike and Lt lateral  Abdominal: tightness in lower quadrants  Diastasis: to be assessed  Distortion:   Breathing: chest breathing and holds breath with MMTs                 External Perineal Exam: no pain can visualize tissue laxity at vaginal opening                               Internal Pelvic Floor: no pain  Patient confirms identification and approves PT to assess internal pelvic floor and treatment Yes No emotional/communication barriers or cognitive limitation. Patient is motivated to learn. Patient understands and agrees with treatment goals and plan. PT explains patient will be examined in standing, sitting, and lying down to see how their muscles and joints work. When they are ready, they will be asked to remove their underwear so PT can examine their perineum. The patient is also given the option of providing their own chaperone as one is not provided in our facility. The patient also has the right and is explained the right to defer or refuse any part of the evaluation or treatment including the internal exam. With the patient's consent, PT will use one gloved finger to gently assess the muscles of the pelvic floor, seeing how well it contracts and relaxes and if there is muscle symmetry. After, the patient will get dressed and PT and patient will discuss exam findings and plan of care. PT and patient discuss plan of care, schedule, attendance policy and HEP activities.  PELVIC MMT:   MMT eval  Vaginal 3/5, 4s, 3 reps  Internal Anal Sphincter   External Anal Sphincter   Puborectalis   Diastasis Recti   (Blank rows = not tested)        TONE: Decreased   PROLAPSE: Possible grade 3 anterior vaginal wall laxity in hooklying with cough (does worsen to protrude out of vaginal  with coughing) tested with pelvic floor activation and no protrusion with cough  TODAY'S TREATMENT:                                                                                                                              DATE:   03/12/24 EVAL Examination completed, findings reviewed, pt educated on POC, HEP, and prolapse relief positions. Pt motivated to participate in PT and agreeable to attempt recommendations.    04/09/24: Pt educated on pessaries and  educated to reach out to MD for additional questions or fitting options On wedge pillow: 2x10 coordinated breathing with pelvic floor contraction and lengthening with exhale and inhale X10 5s isometric pelvic floor contractions (able to hold 6s consistently but attempts at 7 and 8s notable loss of strength per pt Quick flicks x10 abduction green band 2x10 with pelvic floor contraction and exhale Alt marching green band 2x10 with pelvic floor contraction and exhale  PATIENT EDUCATION:  Education details: [id number] Person educated: Patient Education method: Explanation, Demonstration, Tactile cues, Verbal cues, and Handouts Education comprehension: verbalized understanding, returned demonstration, verbal cues required, tactile cues required, and needs further education  HOME EXERCISE PROGRAM: [id number]  ASSESSMENT:  CLINICAL IMPRESSION: Patient is a 68 y.o. female  who was seen today for physical therapy treatment for prolapse and incomplete bladder emptying. Pt presents for first treatment since eval with pt having to cancel one appointment due to scheduling. Pt reports symptoms are about the same. Did tolerate all exercises well with use of wedge pillow and denied prolapse symptoms with this in place. Pt reports she was not high enough with pillow at home. Pt does benefit from cues for coordination of pelvic floor and breathing with all exercises.  Pt would benefit from additional PT to further address deficits.       OBJECTIVE IMPAIRMENTS: decreased activity tolerance, decreased coordination, decreased endurance, decreased mobility, decreased ROM, decreased strength, increased fascial restrictions, impaired perceived functional ability, increased muscle spasms, impaired flexibility, improper body mechanics, and postural dysfunction.   ACTIVITY LIMITATIONS: carrying, lifting, standing, squatting, stairs, transfers, continence, locomotion level, and caring for others  PARTICIPATION  LIMITATIONS: meal prep, cleaning, laundry, shopping, community activity, and yard work  PERSONAL FACTORS: Fitness, Time since onset of injury/illness/exacerbation, and 1 comorbidity: medical history are also affecting patient's functional outcome.   REHAB POTENTIAL: Good  CLINICAL DECISION MAKING: Stable/uncomplicated  EVALUATION COMPLEXITY: Low   GOALS: Goals reviewed with patient? Yes  SHORT TERM GOALS: Target date: 04/09/24  Pt to be I with HEP for carry over and continuing recommendations for improved outcomes.   Baseline: Goal status: on going  2.  Pt will be able to correctly perform diaphragmatic breathing and appropriate pressure management in order to prevent worsening vaginal wall laxity and improve pelvic floor A/ROM.   Baseline:  Goal status: on going  3.  Pt will be independent with the knack, urge suppression technique, and double voiding in order to improve bladder  habits and decrease urinary incontinence.    Baseline:  Goal status: on going  4.  Pt to demonstrate I with pelvic floor coordination with breathing mechanics for proper full mobility of pelvic floor without straining to prevent worsening of laxity at pelvic floor.  Baseline:  Goal status: on going  LONG TERM GOALS: Target date: 09/09/24  Pt to be I with advanced HEP for carry over and continuing recommendations for improved outcomes.   Baseline:  Goal status: INITIAL  2.  Pt to demonstrate improved coordination of pelvic floor and breathing mechanics with 10# squat with appropriate synergistic patterns to decrease pain and leakage at least 75% of the time for improved ability to complete a 30 minute walk without strain at pelvic floor and symptoms.    Baseline:  Goal status: INITIAL  3.  Pt to demonstrate 30 mins of improved posture to midline and upright (will still have lateral curvature due to chronic scoliosis) but improved upright/less flexion for decreased strain at prolapse.  Baseline:   Goal status: INITIAL  4.  Pt to report ability to tolerate carrying in groceries without worsening prolapse due to improved strength and coordination of core/pelvic floor.  Baseline:  Goal status: INITIAL  5.  Pt to report at least 50% reduction in prolapse related symptoms for improved QOL and tolerance to husband's physical assistance needs.  Baseline:  Goal status: INITIAL  PLAN:  PT FREQUENCY: 1x/week  PT DURATION: 15 sessions  PLANNED INTERVENTIONS: 97110-Therapeutic exercises, 97530- Therapeutic activity, 97112- Neuromuscular re-education, 97535- Self Care, [id number]- Manual therapy, [phone]- Canalith repositioning, [id number]- Aquatic Therapy, [phone]- Electrical stimulation (manual), [id number]- Vasopneumatic device, [phone] (1-2 muscles), [id number] (3+ muscles)- Dry Needling, Patient/Family education, Taping, Joint mobilization, Spinal mobilization, Scar mobilization, DME instructions, Cryotherapy, Moist heat, and Biofeedback  PLAN FOR NEXT SESSION: coordination of pelvic floor, breathing mechanics, voiding mechanics, double voiding, core and hip strengthening     Darryle Navy, PT, DPT 10/13/254:11 PM  [facility] [address] [place], [place] [address] Phone # [phone] Fax [phone]

## 2024-04-10 DIAGNOSIS — M419 Scoliosis, unspecified: Secondary | ICD-10-CM | POA: Diagnosis not present

## 2024-04-16 ENCOUNTER — Ambulatory Visit: Admitting: Physical Therapy

## 2024-04-16 DIAGNOSIS — R293 Abnormal posture: Secondary | ICD-10-CM

## 2024-04-16 DIAGNOSIS — R279 Unspecified lack of coordination: Secondary | ICD-10-CM

## 2024-04-16 DIAGNOSIS — M62838 Other muscle spasm: Secondary | ICD-10-CM

## 2024-04-16 DIAGNOSIS — M6281 Muscle weakness (generalized): Secondary | ICD-10-CM | POA: Diagnosis not present

## 2024-04-16 NOTE — Therapy (Signed)
 OUTPATIENT PHYSICAL THERAPY FEMALE PELVIC EVALUATION   Patient Name: Janice Rodriguez MRN: [id number] DOB:1956-03-14, 68 y.o., female Today's Date: 04/16/2024  END OF SESSION:  PT End of Session - 04/16/24 1538     Visit Number 3    Date for Recertification  09/09/24    Authorization Type [employer]    PT Start Time 1531    PT Stop Time 1612    PT Time Calculation (min) 41 min    Activity Tolerance Patient tolerated treatment well    Behavior During Therapy [facility] for tasks assessed/performed            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, CXR: No definite evidence to suggest active intrathoracic tuberculosis    UMBILICAL HERNIORRHAPHY, HX OF    age 12   Vitamin D  deficiency    Wears glasses    Past Surgical History:  Procedure Laterality Date   BILIARY STENT PLACEMENT  11/30/2018   Procedure: BILIARY STENT PLACEMENT;  Surgeon: Aneita Gwendlyn DASEN, MD;  Location: [facility] ENDOSCOPY;  Service: Endoscopy;;   CHOLECYSTECTOMY N/A 11/26/2018   Procedure: LAPAROSCOPIC CHOLECYSTECTOMY WITH INTRAOPERATIVE CHOLANGIOGRAM;  Surgeon: Ebbie Cough, MD;  Location: [facility] OR;  Service: General;  Laterality: N/A;   COLONOSCOPY  2012   Tics   COLONOSCOPY  10/2015   ECTOPIC PREGNANCY SURGERY  1983   ENDOSCOPIC RETROGRADE CHOLANGIOPANCREATOGRAPHY (ERCP) WITH PROPOFOL  N/A 11/17/2018   Procedure: ENDOSCOPIC RETROGRADE  CHOLANGIOPANCREATOGRAPHY (ERCP) WITH PROPOFOL ;  Surgeon: Abran Norleen SAILOR, MD;  Location: [facility] ENDOSCOPY;  Service: Endoscopy;  Laterality: N/A;   ENDOSCOPIC RETROGRADE CHOLANGIOPANCREATOGRAPHY (ERCP) WITH PROPOFOL  N/A 11/30/2018   Procedure: ENDOSCOPIC RETROGRADE CHOLANGIOPANCREATOGRAPHY (ERCP) WITH PROPOFOL ;  Surgeon: Aneita Gwendlyn DASEN, MD;  Location: [facility] ENDOSCOPY;  Service: Endoscopy;  Laterality: N/A;   ERCP N/A 01/22/2019   Procedure: ENDOSCOPIC RETROGRADE CHOLANGIOPANCREATOGRAPHY (ERCP);  Surgeon: Abran Norleen SAILOR, MD;  Location: [facility] ENDOSCOPY;  Service: Endoscopy;  Laterality: N/A;   REMOVAL OF STONES  11/17/2018   Procedure: REMOVAL OF STONES;  Surgeon: Abran Norleen SAILOR, MD;  Location: [facility] ENDOSCOPY;  Service: Endoscopy;;   REMOVAL OF STONES  11/30/2018   Procedure: REMOVAL OF STONES;  Surgeon: Aneita Gwendlyn DASEN, MD;  Location: [facility] ENDOSCOPY;  Service: Endoscopy;;   SPHINCTEROTOMY  11/17/2018   Procedure: ANNETT;  Surgeon: Abran Norleen SAILOR, MD;  Location: [facility] ENDOSCOPY;  Service: Endoscopy;;   UMBILICAL HERNIA REPAIR  1963   UPPER GASTROINTESTINAL ENDOSCOPY  10/2015   VAGINAL HYSTERECTOMY  1992   Patient Active Problem List   Diagnosis Date Noted   Urinary frequency 01/15/2024   Feeling of incomplete bladder emptying 01/13/2024   Vaginal atrophy 01/13/2024   Pelvic organ prolapse quantification stage 3 cystocele 01/13/2024   Aortic atherosclerosis 01/31/2023   Bile leak    Bile leak, postoperative    Cholangitis (HCC) 11/25/2018   AKI (acute kidney injury)  11/25/2018   Bile duct stone    Other hyperlipidemia 03/30/2017   Routine general medical examination at a health care facility 11/21/2015   Vitamin D  deficiency 10/17/2014   Palpitations 11/05/2013   Moderate obesity 07/08/2010   Essential hypertension 06/24/2010   GERD 06/24/2010   Idiopathic scoliosis and kyphoscoliosis 06/24/2010    PCP: Gerome Brunet, DO   REFERRING PROVIDER: Guadlupe Lianne DASEN, MD   REFERRING DIAG: R39.14 (ICD-10-CM)  - Feeling of incomplete bladder emptying N81.10 (ICD-10-CM) - Pelvic organ prolapse quantification stage 3 cystocele  THERAPY DIAG:  Muscle weakness (generalized)  Abnormal posture  Other muscle spasm  Unspecified lack of coordination  Rationale for Evaluation and Treatment: Rehabilitation  ONSET DATE: several years  SUBJECTIVE:                                                                                                                                                                                           SUBJECTIVE STATEMENT:   The props don't seem to help too much but if the pressure is worse in the evening they might. Also well self insert tissue if protrusion felt in the shower.   Fluid intake: water - 3-4 bottles per day; coffee and tea sometimes  FUNCTIONAL LIMITATIONS: pushing husband's wheelchair, lifting (dog 23# and assists husband sometimes), carrying groceries  PERTINENT HISTORY:  Medications for current condition: estrogen cream (has not started yet) Surgeries: vaginal hysterectomy 1992 Other:  Sexual abuse: No  DIAGNOSTIC FINDINGS:  Post-void residual: Voiding Cystourethrogram (VCUG):  Ultrasound: PAIN:  Are you having pain? Yes NPRS scale: 0-6/10, 0 currently Pain location: low back  Pain type: aching and dull Pain description: intermittent   Aggravating factors: higher activity, lifting  Relieving factors:  heat, resting, tylenol  sometimes  PRECAUTIONS: None  RED FLAGS: None   WEIGHT BEARING RESTRICTIONS: No  FALLS:  Has patient fallen in last 6 months? No  OCCUPATION: retired   ACTIVITY LEVEL : low-moderate   PLOF: Independent  PATIENT GOALS: to decreased prolapse to avoid surgery     BOWEL MOVEMENT: Pain with bowel movement: No Type of bowel movement:Type (Bristol Stool Scale) 4, Frequency daily, and Strain no Fully empty rectum: Yes:   Leakage: No                                                      Caused by: na Pads:  No Fiber supplement/laxative No  URINATION: Pain with urination: No Fully empty bladder: Yes:  but needs to sit several mins                                 Post-void dribble: No Stream: Weak Urgency: sometimes Frequency:during the day every time I drink something, usually 1.5-2  Nocturia: Nosometimes but but not always   Leakage: none Pads/briefs: No  INTERCOURSE:  Ability to have vaginal penetration Yes  Pain with intercourse: none Dryness: Yes but uses lubricant  Climax: not painful Marinoff Scale: 0/3 Lubricant:yes  PREGNANCY: Vaginal deliveries 3 Tearing No (doesn't think so) Episiotomy Yes (third) C-section deliveries 0 Currently pregnant No  PROLAPSE: Pressure vaginally with lifting, straining    OBJECTIVE:  Note: Objective measures were completed at Evaluation unless otherwise noted.  DIAGNOSTIC FINDINGS:     COGNITION: Overall cognitive status: Within functional limits for tasks assessed     SENSATION: Light touch: Appears intact   FUNCTIONAL TESTS:   Single leg stance: unable to complete due to poor balance  Rt:0  Lt:0 Sit-up test:0/3  GAIT: WFL  POSTURE: rounded shoulders, forward head, posterior pelvic tilt, and Rt rib hump and Lt lateral positioning of pelvis pt with scoliosis    LUMBARAROM/PROM:  A/PROM A/PROM  Eval   Flexion Limited by 50%  Extension Limited by 25%  Right lateral flexion Limited by75%  Left lateral flexion Limited by 50%  Right rotation Limited by75%  Left rotation Limited by 25%   (Blank rows = not tested)  LOWER EXTREMITY ROM:  Bil hamstrings and adductors limited by 50%  LOWER EXTREMITY MMT: Bil hip flexion, extension 3+/5, abduction and adduction 3/5 PALPATION:  General: tightness throughout bil paraspinals   Pelvic Alignment: scoliosis with Rt pelvic hike and Lt lateral  Abdominal: tightness in lower quadrants  Diastasis: to be assessed  Distortion:   Breathing: chest breathing and holds breath  with MMTs                 External Perineal Exam: no pain can visualize tissue laxity at vaginal opening                              Internal Pelvic Floor: no pain  Patient confirms identification and approves PT to assess internal pelvic floor and treatment Yes No emotional/communication barriers or cognitive limitation. Patient is motivated to learn. Patient understands and agrees with treatment goals and plan. PT explains patient will be examined in standing, sitting, and lying down to see how their muscles and joints work. When they are ready, they will be asked to remove their underwear so PT can examine their perineum. The patient is also given the option of providing their own chaperone as one is not provided in our facility. The patient also has the right and is explained the right to defer or refuse any part of the evaluation or treatment including the internal exam. With the patient's consent, PT will use one gloved finger to gently assess the muscles of the pelvic floor, seeing how well it contracts and relaxes and if there is muscle symmetry. After, the patient will get dressed and PT and patient will discuss exam findings and plan of care. PT and patient discuss plan of care, schedule, attendance policy and HEP activities.  PELVIC MMT:   MMT eval  Vaginal 3/5, 4s, 3 reps  Internal Anal Sphincter   External Anal Sphincter   Puborectalis   Diastasis  Recti   (Blank rows = not tested)        TONE: Decreased   PROLAPSE: Possible grade 3 anterior vaginal wall laxity in hooklying with cough (does worsen to protrude out of vaginal with coughing) tested with pelvic floor activation and no protrusion with cough  TODAY'S TREATMENT:                                                                                                                              DATE:   03/12/24 EVAL Examination completed, findings reviewed, pt educated on POC, HEP, and prolapse relief positions. Pt motivated  to participate in PT and agreeable to attempt recommendations.    04/09/24: Pt educated on pessaries and educated to reach out to MD for additional questions or fitting options On wedge pillow: 2x10 coordinated breathing with pelvic floor contraction and lengthening with exhale and inhale X10 5s isometric pelvic floor contractions (able to hold 6s consistently but attempts at 7 and 8s notable loss of strength per pt Quick flicks x10 abduction green band 2x10 with pelvic floor contraction and exhale Alt marching green band 2x10 with pelvic floor contraction and exhale 04/16/24: Hooklying with x3 pillow props (more comfortable than wedge): 2x10 green band hip abduction with pelvic floor activation and exhale 2x10 green band hip flexion with pelvic floor activation and exhale 2x10 ball squeezes (adductor)  with pelvic floor activation and exhale X10 10s pelvic floor contractions reports 7s easy then final much harder Sitting - x10 pelvic floor contractions with exhale X10 sit to stands - reports rep 8 felt loss of contraction but improved final 2 reps >3# x10>6# x10 no pressure   PATIENT EDUCATION:  Education details: [id number] Person educated: Patient Education method: Programmer, multimedia, Demonstration, Tactile cues, Verbal cues, and Handouts Education comprehension: verbalized understanding, returned demonstration, verbal cues required, tactile cues required, and needs further education  HOME EXERCISE PROGRAM: [id number]  ASSESSMENT:  CLINICAL IMPRESSION: Patient is a 68 y.o. female  who was seen today for physical therapy treatment for prolapse and incomplete bladder emptying. Pt reports symptoms are about the same. Did tolerate all exercises well with use of pillows and denied prolapse symptoms with this in place. Pt had increased challenge of exercises today and no pressure at end of session. Pt does benefit from cues for coordination of pelvic floor and breathing with all exercises.  Pt  would benefit from additional PT to further address deficits.       OBJECTIVE IMPAIRMENTS: decreased activity tolerance, decreased coordination, decreased endurance, decreased mobility, decreased ROM, decreased strength, increased fascial restrictions, impaired perceived functional ability, increased muscle spasms, impaired flexibility, improper body mechanics, and postural dysfunction.   ACTIVITY LIMITATIONS: carrying, lifting, standing, squatting, stairs, transfers, continence, locomotion level, and caring for others  PARTICIPATION LIMITATIONS: meal prep, cleaning, laundry, shopping, community activity, and yard work  PERSONAL FACTORS: Fitness, Time since onset of injury/illness/exacerbation, and 1 comorbidity: medical history are also affecting patient's functional outcome.  REHAB POTENTIAL: Good  CLINICAL DECISION MAKING: Stable/uncomplicated  EVALUATION COMPLEXITY: Low   GOALS: Goals reviewed with patient? Yes  SHORT TERM GOALS: Target date: 04/09/24  Pt to be I with HEP for carry over and continuing recommendations for improved outcomes.   Baseline: Goal status: on going  2.  Pt will be able to correctly perform diaphragmatic breathing and appropriate pressure management in order to prevent worsening vaginal wall laxity and improve pelvic floor A/ROM.   Baseline:  Goal status: on going  3.  Pt will be independent with the knack, urge suppression technique, and double voiding in order to improve bladder habits and decrease urinary incontinence.    Baseline:  Goal status: on going  4.  Pt to demonstrate I with pelvic floor coordination with breathing mechanics for proper full mobility of pelvic floor without straining to prevent worsening of laxity at pelvic floor.  Baseline:  Goal status: on going  LONG TERM GOALS: Target date: 09/09/24  Pt to be I with advanced HEP for carry over and continuing recommendations for improved outcomes.   Baseline:  Goal status:  INITIAL  2.  Pt to demonstrate improved coordination of pelvic floor and breathing mechanics with 10# squat with appropriate synergistic patterns to decrease pain and leakage at least 75% of the time for improved ability to complete a 30 minute walk without strain at pelvic floor and symptoms.    Baseline:  Goal status: INITIAL  3.  Pt to demonstrate 30 mins of improved posture to midline and upright (will still have lateral curvature due to chronic scoliosis) but improved upright/less flexion for decreased strain at prolapse.  Baseline:  Goal status: INITIAL  4.  Pt to report ability to tolerate carrying in groceries without worsening prolapse due to improved strength and coordination of core/pelvic floor.  Baseline:  Goal status: INITIAL  5.  Pt to report at least 50% reduction in prolapse related symptoms for improved QOL and tolerance to husband's physical assistance needs.  Baseline:  Goal status: INITIAL  PLAN:  PT FREQUENCY: 1x/week  PT DURATION: 15 sessions  PLANNED INTERVENTIONS: 97110-Therapeutic exercises, 97530- Therapeutic activity, 97112- Neuromuscular re-education, 97535- Self Care, [id number]- Manual therapy, [phone]- Canalith repositioning, [id number]- Aquatic Therapy, [phone]- Electrical stimulation (manual), [id number]- Vasopneumatic device, [phone] (1-2 muscles), [id number] (3+ muscles)- Dry Needling, Patient/Family education, Taping, Joint mobilization, Spinal mobilization, Scar mobilization, DME instructions, Cryotherapy, Moist heat, and Biofeedback  PLAN FOR NEXT SESSION: coordination of pelvic floor, breathing mechanics, voiding mechanics, double voiding, core and hip strengthening     Darryle Navy, PT, DPT 10/20/254:44 PM  [facility] [address], [address] [place], [place] [address] Phone # [phone] Fax [phone]

## 2024-04-18 DIAGNOSIS — M419 Scoliosis, unspecified: Secondary | ICD-10-CM | POA: Diagnosis not present

## 2024-04-19 ENCOUNTER — Ambulatory Visit: Admitting: Obstetrics

## 2024-04-22 NOTE — Progress Notes (Unsigned)
 Cardiology Office Note:    Date:  04/24/2024   ID:  Janice Rodriguez, DOB 25-May-1956, MRN [id number]  PCP:  Gerome Brunet, DO   [facility] Providers Cardiologist:  Annabella Scarce, MD     Referring MD: Gerome Brunet, DO   Chief complaint: Follow-up of CAD     History of Present Illness:   Janice Rodriguez is a 68 y.o. female with a hx of CAD, aortic atherosclerosis, PVCs, hypertension, hyperlipidemia who was previously followed by Dr. Claudene who recommended aggressive medical management of her CAD, however patient declined statin therapy.  Echo 01/02/2014, performed secondary to atrial fibrillation: LVEF 60-65%, mild LVH, systolic function normal, LA mildly dilated RA moderately dilated.  Last seen by Dr. Scarce 01/31/2023, was asymptomatic of cardiovascular complaints.  Education was given on hyperlipidemia as well as treatment options, patient states she would consider her options, was referred to the prep program at the [facility].  Patient declined coronary calcium scoring at this visit.  It appears at previous visits with Dr. Claudene, he also recommended a coronary CT, which she declined.  Presents independently, doing well from a cardiovascular standpoint. She denies chest pain, palpitations, dyspnea, orthopnea, n, v, dark/tarry/bloody stools, hematuria, dizziness, syncope, edema, weight gain.  Patient states her blood pressures are typically well-controlled at home, averages 120s-130s systolic over 70s-80s diastolic.  Reports she likely is hypertensive today because her scoliosis was causing her discomfort sitting on our table.  Goes to physical therapy for both scoliosis as well as bladder prolapse, averaging 2 times per week.  Is a retired transport planner, who used to work for [employer].  Stopped her aspirin a few years ago after noticing bruising all over her body.  After stopping her aspirin bruising mildly improved, still persisted, warranting a heme/onc follow-up with a  negative workup.   ROS:   Please see the history of present illness.    All other systems reviewed and are negative.     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, CXR: No definite evidence to suggest active intrathoracic tuberculosis    UMBILICAL HERNIORRHAPHY, HX OF    age 36   Vitamin D  deficiency    Wears glasses     Past Surgical History:  Procedure Laterality Date   BILIARY STENT PLACEMENT  11/30/2018   Procedure: BILIARY STENT PLACEMENT;  Surgeon: Aneita Gwendlyn DASEN, MD;  Location: [facility] ENDOSCOPY;  Service: Endoscopy;;   CHOLECYSTECTOMY N/A 11/26/2018   Procedure: LAPAROSCOPIC CHOLECYSTECTOMY WITH INTRAOPERATIVE CHOLANGIOGRAM;  Surgeon: Ebbie Cough, MD;  Location: [facility] OR;  Service: General;  Laterality: N/A;   COLONOSCOPY  2012   Tics   COLONOSCOPY  10/2015   ECTOPIC PREGNANCY SURGERY  1983   ENDOSCOPIC RETROGRADE CHOLANGIOPANCREATOGRAPHY (ERCP) WITH PROPOFOL  N/A 11/17/2018   Procedure: ENDOSCOPIC RETROGRADE CHOLANGIOPANCREATOGRAPHY (ERCP) WITH PROPOFOL ;  Surgeon: Abran Norleen SAILOR, MD;  Location: [facility] ENDOSCOPY;  Service: Endoscopy;  Laterality: N/A;   ENDOSCOPIC RETROGRADE CHOLANGIOPANCREATOGRAPHY (ERCP) WITH PROPOFOL  N/A 11/30/2018   Procedure: ENDOSCOPIC RETROGRADE CHOLANGIOPANCREATOGRAPHY (ERCP) WITH PROPOFOL ;  Surgeon: Aneita Gwendlyn DASEN, MD;  Location[facility] ENDOSCOPY;  Service: Endoscopy;  Laterality: N/A;   ERCP N/A 01/22/2019    Procedure: ENDOSCOPIC RETROGRADE CHOLANGIOPANCREATOGRAPHY (ERCP);  Surgeon: Abran Norleen SAILOR, MD;  Location: [facility] ENDOSCOPY;  Service: Endoscopy;  Laterality: N/A;   REMOVAL OF STONES  11/17/2018   Procedure: REMOVAL OF STONES;  Surgeon: Abran Norleen SAILOR, MD;  Location: [facility] ENDOSCOPY;  Service: Endoscopy;;   REMOVAL OF STONES  11/30/2018   Procedure: REMOVAL OF STONES;  Surgeon: Aneita Gwendlyn DASEN, MD;  Location: [facility] ENDOSCOPY;  Service: Endoscopy;;   SPHINCTEROTOMY  11/17/2018   Procedure: ANNETT;  Surgeon: Abran Norleen SAILOR, MD;  Location: [facility] ENDOSCOPY;  Service: Endoscopy;;   UMBILICAL HERNIA REPAIR  1963   UPPER GASTROINTESTINAL ENDOSCOPY  10/2015   VAGINAL HYSTERECTOMY  1992    Current Medications: Current Meds  Medication Sig   acetaminophen  (TYLENOL ) 500 MG tablet Take 1,000 mg by mouth every 6 (six) hours as needed (pain).   candesartan  (ATACAND ) 16 MG tablet Take 1 tablet (16 mg total) by mouth daily.   cholecalciferol  (VITAMIN D3) 25 MCG (1000 UT) tablet Take 1,000 Units by mouth daily.   esomeprazole  (NEXIUM ) 40 MG capsule Take 1 capsule (40 mg total) by mouth 2 (two) times daily before a meal. Office visit for further refills   hydrochlorothiazide  (MICROZIDE ) 12.5 MG capsule Take 1 capsule (12.5 mg total) by mouth in the morning.   metoprolol  succinate (TOPROL -XL) 50 MG 24 hr tablet Take 1 & 1/2  tablets (75 mg total) by mouth daily. Please schedule an appointment with Dr.Awendaw to receive future refills.   potassium chloride  SA (KLOR-CON  M) 20 MEQ tablet Take 1 tablet (20 mEq total) by mouth daily.   Probiotic Product (PROBIOTIC DAILY PO) Take 1 capsule by mouth daily as needed (upset stomach).   vitamin C  (ASCORBIC ACID ) 500 MG tablet Take 500 mg by mouth daily.     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 3 grown kids. 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   Social Drivers of Corporate Investment Banker Strain: Low Risk  (01/27/2024)   Received from [facility]   Overall Financial Resource Strain (CARDIA)    How hard is it for you to pay for the very basics like food, housing, medical care, and heating?: Not hard at all  Food Insecurity: No Food Insecurity (01/27/2024)   Received from [facility]   Hunger Vital Sign    Within  the past 12 months, you worried that your food would run out before you got the money to buy more.: Never true    Within the past 12 months, the food you bought just didn't last and you didn't have money to get more.: Never true  Transportation Needs: No Transportation Needs (01/27/2024)   Received from [facility] - Transportation    In the past 12 months, has lack of transportation kept you from medical appointments or from getting medications?: No    In the past 12 months, has lack of transportation kept you from meetings, work, or from getting things needed for daily living?: No  Physical Activity: Not on file  Stress: Not on file  Social Connections: Not on file     Family History: The patient's family history includes Breast cancer in her mother; Colon cancer in her father and paternal aunt; Diabetes in her brother and another family member; Diabetes (age of onset: 28) in her mother; Heart attack in  her brother; Heart disease in her father; Hyperlipidemia in an other family member; Hypertension in her brother and another family member; Sarcoidosis in her brother; Stroke in her brother and father. There is no history of Esophageal cancer, Pancreatic cancer, Rectal cancer, Stomach cancer, Colon polyps, Uterine cancer, Bladder Cancer, or Renal cancer.  EKGs/Labs/Other Studies Reviewed:    The following studies were reviewed today:  EKG Interpretation Date/Time:  Monday April 23 2024 15:54:13 EDT Ventricular Rate:  60 PR Interval:  194 QRS Duration:  110 QT Interval:  444 QTC Calculation: 444 R Axis:   -39  Text Interpretation: Normal sinus rhythm Left axis deviation Incomplete right bundle branch block Minimal voltage criteria for LVH, may be normal variant ( Cornell product ) When compared with ECG of 31-Jan-2023 15:16, No significant change was found Confirmed by Yoko Mcgahee [phone]) on 04/23/2024 7:06:00 PM    Recent Labs: No results found for requested labs within last 365 days.  Recent Lipid Panel    Component Value Date/Time   CHOL 195 11/21/2015 1021   TRIG 142.0 11/21/2015 1021   HDL 33.60 (L) 11/21/2015 1021   CHOLHDL 6 11/21/2015 1021   VLDL 28.4 11/21/2015 1021   LDLCALC 133 (H) 11/21/2015 1021   LDLDIRECT 146.8 07/13/2013 0859     Risk Assessment/Calculations:      HYPERTENSION CONTROL Vitals:   04/23/24 1551 04/23/24 1600  BP: (!) 172/98 (!) 156/82    The patient's blood pressure is elevated above target today.  In order to address the patient's elevated BP: Blood pressure will be monitored at home to determine if medication changes need to be made. (She declines BP med titration this visit)            Physical Exam:    VS:  BP (!) 156/82   Pulse 60   Ht 5' 3 (1.6 m)   Wt 177 lb 11.2 oz (80.6 kg)   SpO2 99%   BMI 31.48 kg/m        Wt Readings from Last 3 Encounters:  04/23/24 177 lb 11.2 oz (80.6 kg)  01/13/24 177 lb 12.8 oz (80.6  kg)  01/31/23 192 lb 12.8 oz (87.5 kg)     GEN:  Well nourished, well developed in no acute distress HEENT: Normal NECK:  No carotid bruits CARDIAC:  S1-S2 normal, RRR, no murmurs, rubs, gallops RESPIRATORY:  Clear to auscultation without rales, wheezing or rhonchi  MUSCULOSKELETAL:  No edema; No deformity, DP/PT palpable bilaterally  SKIN: Warm and dry NEUROLOGIC:  Alert and oriented x 3 PSYCHIATRIC:  Normal affect       Assessment & Plan Primary hypertension Reports BPs well-controlled at home, states she has never seen a reading as high as today before 120s-130s/70s-80s on average Not interested in adjusting her medicines today as she feels these readings are secondary to pain from scoliosis Continue candesartan  16 mg daily Continue hydrochlorothiazide  12.5 mg daily Continue metoprolol  succinate 75 mg daily  Patient reports she gets all of her labs performed at her PCPs office next month, and does not wish to have blood work performed at today's visit States she will have the office fax those labs over Aortic atherosclerosis Patient was interested in a cardiac CT calcium scoring this visit Discussed with patient that if it showed elevated calcium levels our recommendations would be to start aspirin and statin therapy, which she had previously declined Patient stated that if she saw a high calcium score, she may be more inclined to start statin therapy after further education was provided about the benefits of lipid-lowering therapies. Will order cardiac CT calcium score Hyperlipidemia, unspecified hyperlipidemia type Patient reports she will have her lipids checked with her PCP next month and we will fax results to our office Currently not interested in starting any statin therapy at this time  Follow-up in 1 year or sooner if new symptoms occur          Medication Adjustments/Labs and Tests Ordered: Current medicines are reviewed at length with the patient today.   Concerns regarding medicines are outlined above.  Orders Placed This Encounter  Procedures   CT CARDIAC SCORING (SELF PAY ONLY)   EKG 12-Lead   No orders of the defined types were placed in this encounter.   Patient Instructions  Medication Instructions:   Your physician recommends that you continue on your current medications as directed. Please refer to the Current Medication list given to you today.  *If you need a refill on your cardiac medications before your next appointment, please call your pharmacy*   Testing/Procedures:  CARDIAC CALCIUM SCORE (SELF PAY)   Follow-Up: At [facility], you and your health needs are our priority.  As part of our continuing mission to provide you with exceptional heart care, our providers are all part of one team.  This team includes your primary Cardiologist (physician) and Advanced Practice Providers or APPs (Physician Assistants and Nurse Practitioners) who all work together to provide you with the care you need, when you need it.  Your next appointment:   1 year(s)  Provider:   Annabella Scarce, MD, Rosaline Bane, NP, or Reche Finder, NP               Signed, Miriam FORBES Shams, NP  04/24/2024 10:42 AM     [facility]

## 2024-04-23 ENCOUNTER — Ambulatory Visit (HOSPITAL_BASED_OUTPATIENT_CLINIC_OR_DEPARTMENT_OTHER): Admitting: Emergency Medicine

## 2024-04-23 ENCOUNTER — Encounter (HOSPITAL_BASED_OUTPATIENT_CLINIC_OR_DEPARTMENT_OTHER): Payer: Self-pay | Admitting: Emergency Medicine

## 2024-04-23 VITALS — BP 156/82 | HR 60 | Ht 63.0 in | Wt 177.7 lb

## 2024-04-23 DIAGNOSIS — I10 Essential (primary) hypertension: Secondary | ICD-10-CM | POA: Diagnosis not present

## 2024-04-23 DIAGNOSIS — I700 Atherosclerosis of aorta: Secondary | ICD-10-CM | POA: Diagnosis not present

## 2024-04-23 DIAGNOSIS — E785 Hyperlipidemia, unspecified: Secondary | ICD-10-CM

## 2024-04-23 NOTE — Assessment & Plan Note (Signed)
 Patient was interested in a cardiac CT calcium scoring this visit Discussed with patient that if it showed elevated calcium levels our recommendations would be to start aspirin and statin therapy, which she had previously declined Patient stated that if she saw a high calcium score, she may be more inclined to start statin therapy after further education was provided about the benefits of lipid-lowering therapies. Will order cardiac CT calcium score

## 2024-04-23 NOTE — Patient Instructions (Signed)
 Medication Instructions:   Your physician recommends that you continue on your current medications as directed. Please refer to the Current Medication list given to you today.  *If you need a refill on your cardiac medications before your next appointment, please call your pharmacy*   Testing/Procedures:  CARDIAC CALCIUM SCORE (SELF PAY)   Follow-Up: At [facility], you and your health needs are our priority.  As part of our continuing mission to provide you with exceptional heart care, our providers are all part of one team.  This team includes your primary Cardiologist (physician) and Advanced Practice Providers or APPs (Physician Assistants and Nurse Practitioners) who all work together to provide you with the care you need, when you need it.  Your next appointment:   1 year(s)  Provider:   Annabella Scarce, MD, Rosaline Bane, NP, or Reche Finder, NP

## 2024-04-24 ENCOUNTER — Encounter (HOSPITAL_BASED_OUTPATIENT_CLINIC_OR_DEPARTMENT_OTHER): Payer: Self-pay | Admitting: Emergency Medicine

## 2024-04-25 ENCOUNTER — Other Ambulatory Visit (HOSPITAL_COMMUNITY): Payer: Self-pay

## 2024-04-25 ENCOUNTER — Other Ambulatory Visit: Payer: Self-pay | Admitting: Cardiovascular Disease

## 2024-04-25 MED ORDER — CANDESARTAN CILEXETIL 16 MG PO TABS
16.0000 mg | ORAL_TABLET | Freq: Every day | ORAL | 3 refills | Status: AC
  Filled 2024-04-25: qty 90, 90d supply, fill #0
  Filled 2024-07-24 (×2): qty 90, 90d supply, fill #1

## 2024-04-30 DIAGNOSIS — H52203 Unspecified astigmatism, bilateral: Secondary | ICD-10-CM | POA: Diagnosis not present

## 2024-04-30 DIAGNOSIS — H2513 Age-related nuclear cataract, bilateral: Secondary | ICD-10-CM | POA: Diagnosis not present

## 2024-04-30 DIAGNOSIS — H5203 Hypermetropia, bilateral: Secondary | ICD-10-CM | POA: Diagnosis not present

## 2024-05-03 ENCOUNTER — Other Ambulatory Visit: Payer: Self-pay | Admitting: Cardiovascular Disease

## 2024-05-04 ENCOUNTER — Other Ambulatory Visit: Payer: Self-pay

## 2024-05-04 ENCOUNTER — Other Ambulatory Visit (HOSPITAL_COMMUNITY): Payer: Self-pay

## 2024-05-04 MED ORDER — POTASSIUM CHLORIDE CRYS ER 20 MEQ PO TBCR
20.0000 meq | EXTENDED_RELEASE_TABLET | Freq: Every day | ORAL | 3 refills | Status: AC
  Filled 2024-05-04: qty 90, 90d supply, fill #0
  Filled 2024-07-30: qty 90, 90d supply, fill #1

## 2024-05-05 ENCOUNTER — Other Ambulatory Visit (HOSPITAL_COMMUNITY): Payer: Self-pay

## 2024-05-10 ENCOUNTER — Other Ambulatory Visit: Payer: Self-pay | Admitting: Cardiovascular Disease

## 2024-05-11 ENCOUNTER — Other Ambulatory Visit (HOSPITAL_COMMUNITY): Payer: Self-pay

## 2024-05-11 ENCOUNTER — Other Ambulatory Visit: Payer: Self-pay

## 2024-05-11 MED ORDER — METOPROLOL SUCCINATE ER 50 MG PO TB24
75.0000 mg | ORAL_TABLET | Freq: Every day | ORAL | 3 refills | Status: AC
  Filled 2024-05-11: qty 135, 90d supply, fill #0

## 2024-06-12 ENCOUNTER — Other Ambulatory Visit (HOSPITAL_COMMUNITY): Payer: Self-pay

## 2024-06-12 ENCOUNTER — Other Ambulatory Visit: Payer: Self-pay | Admitting: Cardiovascular Disease

## 2024-06-12 MED ORDER — HYDROCHLOROTHIAZIDE 12.5 MG PO CAPS
12.5000 mg | ORAL_CAPSULE | Freq: Every morning | ORAL | 3 refills | Status: AC
  Filled 2024-06-12: qty 90, 90d supply, fill #0

## 2024-07-07 ENCOUNTER — Other Ambulatory Visit: Payer: Self-pay | Admitting: Internal Medicine

## 2024-07-09 ENCOUNTER — Encounter: Payer: Self-pay | Admitting: *Deleted

## 2024-07-14 ENCOUNTER — Other Ambulatory Visit: Payer: Self-pay | Admitting: Internal Medicine

## 2024-07-16 ENCOUNTER — Telehealth: Payer: Self-pay | Admitting: Internal Medicine

## 2024-07-16 ENCOUNTER — Other Ambulatory Visit (HOSPITAL_COMMUNITY): Payer: Self-pay

## 2024-07-16 NOTE — Telephone Encounter (Signed)
 Inbound call from patient requesting a refill for nexium . Patient has been scheduled for 2/17. Please advise, thank you

## 2024-07-17 ENCOUNTER — Other Ambulatory Visit (HOSPITAL_COMMUNITY): Payer: Self-pay

## 2024-07-17 MED ORDER — ESOMEPRAZOLE MAGNESIUM 40 MG PO CPDR
40.0000 mg | DELAYED_RELEASE_CAPSULE | Freq: Two times a day (BID) | ORAL | 0 refills | Status: AC
  Filled 2024-07-17: qty 180, 90d supply, fill #0

## 2024-07-17 NOTE — Telephone Encounter (Signed)
 Nexium  refilled

## 2024-07-18 ENCOUNTER — Ambulatory Visit: Admitting: Podiatry

## 2024-07-18 ENCOUNTER — Ambulatory Visit

## 2024-07-18 ENCOUNTER — Encounter: Payer: Self-pay | Admitting: Podiatry

## 2024-07-18 DIAGNOSIS — M76822 Posterior tibial tendinitis, left leg: Secondary | ICD-10-CM

## 2024-07-18 DIAGNOSIS — M7752 Other enthesopathy of left foot: Secondary | ICD-10-CM

## 2024-07-18 DIAGNOSIS — M722 Plantar fascial fibromatosis: Secondary | ICD-10-CM | POA: Diagnosis not present

## 2024-07-18 DIAGNOSIS — M87262 Osteonecrosis due to previous trauma, left tibia: Secondary | ICD-10-CM

## 2024-07-18 DIAGNOSIS — M76821 Posterior tibial tendinitis, right leg: Secondary | ICD-10-CM | POA: Diagnosis not present

## 2024-07-18 MED ORDER — TRIAMCINOLONE ACETONIDE 10 MG/ML IJ SUSP
10.0000 mg | Freq: Once | INTRAMUSCULAR | Status: AC
  Administered 2024-07-18: 10 mg via INTRA_ARTICULAR

## 2024-07-24 ENCOUNTER — Other Ambulatory Visit (HOSPITAL_COMMUNITY): Payer: Self-pay

## 2024-07-25 ENCOUNTER — Other Ambulatory Visit (HOSPITAL_COMMUNITY): Payer: Self-pay

## 2024-07-25 NOTE — Progress Notes (Signed)
 Subjective:   Patient ID: Janice Rodriguez, female   DOB: 69 y.o.   MRN: [id number]   HPI Patient has developed a lot of pain in the bottom of both heels and states that it is been very painful to walk on bilateral.  Also complaining of left ankle pain   ROS      Objective:  Physical Exam  Patient has acute pain in the plantar aspect of the heel region bilateral fluid buildup noted     Assessment:  Acute plantar fasciitis bilateral     Plan:  H&P reviewed today I did sterile prep injected the plantar fascia at insertion 3 mg Kenalog  5 mg Xylocaine  tolerated well reappoint to recheck  X-rays indicate spur formation plantar aspect heel region bilateral significant collapse medial longitudinal arch bilateral discomfort into the left ankle with no indication of arthritis

## 2024-08-14 ENCOUNTER — Ambulatory Visit: Admitting: Gastroenterology

## 2024-08-22 ENCOUNTER — Ambulatory Visit: Admitting: Podiatry
